# Patient Record
Sex: Male | Born: 1985 | Race: White | Hispanic: No | Smoking: Current every day smoker
Health system: Southern US, Community
[De-identification: ages and names within clinical notes are randomized; demographics above are authoritative.]

## PROBLEM LIST (undated history)

## (undated) DIAGNOSIS — F419 Anxiety disorder, unspecified: Secondary | ICD-10-CM

## (undated) DIAGNOSIS — N289 Disorder of kidney and ureter, unspecified: Secondary | ICD-10-CM

## (undated) DIAGNOSIS — Z87442 Personal history of urinary calculi: Secondary | ICD-10-CM

## (undated) DIAGNOSIS — G723 Periodic paralysis: Secondary | ICD-10-CM

## (undated) DIAGNOSIS — E876 Hypokalemia: Secondary | ICD-10-CM

## (undated) DIAGNOSIS — N200 Calculus of kidney: Secondary | ICD-10-CM

## (undated) DIAGNOSIS — F32A Depression, unspecified: Secondary | ICD-10-CM

## (undated) HISTORY — DX: Depression, unspecified: F32.A

## (undated) HISTORY — PX: OTHER SURGICAL HISTORY: SHX169

## (undated) HISTORY — DX: Anxiety disorder, unspecified: F41.9

## (undated) HISTORY — PX: LITHOTRIPSY: SUR834

---

## 2017-12-05 ENCOUNTER — Encounter (HOSPITAL_BASED_OUTPATIENT_CLINIC_OR_DEPARTMENT_OTHER): Payer: Self-pay | Admitting: *Deleted

## 2017-12-05 ENCOUNTER — Other Ambulatory Visit: Payer: Self-pay

## 2017-12-05 ENCOUNTER — Observation Stay (HOSPITAL_BASED_OUTPATIENT_CLINIC_OR_DEPARTMENT_OTHER)
Admission: EM | Admit: 2017-12-05 | Discharge: 2017-12-06 | DRG: 684 | Disposition: A | Discharge status: Home / Self Care | Payer: Self-pay | Attending: Internal Medicine | Admitting: Internal Medicine

## 2017-12-05 DIAGNOSIS — N179 Acute kidney failure, unspecified: Secondary | ICD-10-CM | POA: Insufficient documentation

## 2017-12-05 DIAGNOSIS — Z881 Allergy status to other antibiotic agents status: Secondary | ICD-10-CM

## 2017-12-05 DIAGNOSIS — N289 Disorder of kidney and ureter, unspecified: Secondary | ICD-10-CM

## 2017-12-05 DIAGNOSIS — M6281 Muscle weakness (generalized): Secondary | ICD-10-CM

## 2017-12-05 DIAGNOSIS — D72829 Elevated white blood cell count, unspecified: Secondary | ICD-10-CM | POA: Diagnosis present

## 2017-12-05 DIAGNOSIS — Z6834 Body mass index (BMI) 34.0-34.9, adult: Secondary | ICD-10-CM

## 2017-12-05 DIAGNOSIS — E860 Dehydration: Secondary | ICD-10-CM | POA: Diagnosis present

## 2017-12-05 DIAGNOSIS — E876 Hypokalemia: Secondary | ICD-10-CM | POA: Diagnosis present

## 2017-12-05 HISTORY — DX: Acute kidney failure, unspecified: N17.9

## 2017-12-05 HISTORY — DX: Hypokalemia: E87.6

## 2017-12-05 HISTORY — DX: Calculus of kidney: N20.0

## 2017-12-05 HISTORY — DX: Disorder of kidney and ureter, unspecified: N28.9

## 2017-12-05 LAB — BASIC METABOLIC PANEL
ANION GAP: 8 (ref 5–15)
BUN: 18 mg/dL (ref 6–20)
CALCIUM: 8.4 mg/dL — AB (ref 8.9–10.3)
CHLORIDE: 115 mmol/L — AB (ref 101–111)
CO2: 18 mmol/L — AB (ref 22–32)
Creatinine, Ser: 2.03 mg/dL — ABNORMAL HIGH (ref 0.61–1.24)
GFR calc non Af Amer: 42 mL/min — ABNORMAL LOW (ref >60)
GFR, EST AFRICAN AMERICAN: 49 mL/min — AB (ref >60)
Glucose, Bld: 125 mg/dL — ABNORMAL HIGH (ref 65–99)
Potassium: 2.3 mmol/L — CL (ref 3.5–5.1)
SODIUM: 141 mmol/L (ref 135–145)

## 2017-12-05 LAB — CBC WITH DIFFERENTIAL/PLATELET
BASOS PCT: 0 %
Basophils Absolute: 0 10*3/uL (ref 0.0–0.1)
EOS ABS: 0.3 10*3/uL (ref 0.0–0.7)
Eosinophils Relative: 2 %
HEMATOCRIT: 47.4 % (ref 39.0–52.0)
Hemoglobin: 17.1 g/dL — ABNORMAL HIGH (ref 13.0–17.0)
Lymphocytes Relative: 24 %
Lymphs Abs: 2.9 10*3/uL (ref 0.7–4.0)
MCH: 30.8 pg (ref 26.0–34.0)
MCHC: 36.1 g/dL — AB (ref 30.0–36.0)
MCV: 85.3 fL (ref 78.0–100.0)
MONOS PCT: 6 %
Monocytes Absolute: 0.7 10*3/uL (ref 0.1–1.0)
NEUTROS ABS: 8.1 10*3/uL — AB (ref 1.7–7.7)
NEUTROS PCT: 68 %
Platelets: 288 10*3/uL (ref 150–400)
RBC: 5.56 MIL/uL (ref 4.22–5.81)
RDW: 13.8 % (ref 11.5–15.5)
WBC: 11.9 10*3/uL — AB (ref 4.0–10.5)

## 2017-12-05 LAB — MAGNESIUM: MAGNESIUM: 2.2 mg/dL (ref 1.7–2.4)

## 2017-12-05 LAB — TSH: TSH: 1.99 u[IU]/mL (ref 0.350–4.500)

## 2017-12-05 MED ORDER — POTASSIUM CHLORIDE CRYS ER 20 MEQ PO TBCR
40.0000 meq | EXTENDED_RELEASE_TABLET | Freq: Once | ORAL | Status: AC
  Administered 2017-12-05: 40 meq via ORAL
  Filled 2017-12-05: qty 2

## 2017-12-05 MED ORDER — POTASSIUM CHLORIDE IN NACL 40-0.9 MEQ/L-% IV SOLN
INTRAVENOUS | Status: DC
  Administered 2017-12-05 – 2017-12-06 (×2): 100 mL/h via INTRAVENOUS
  Filled 2017-12-05 (×2): qty 1000

## 2017-12-05 MED ORDER — SODIUM CHLORIDE 0.9 % IV BOLUS
1000.0000 mL | Freq: Once | INTRAVENOUS | Status: AC
  Administered 2017-12-05: 1000 mL via INTRAVENOUS

## 2017-12-05 MED ORDER — ACETAMINOPHEN 325 MG PO TABS: 650.0000 mg | ORAL_TABLET | Freq: Four times a day (QID) | ORAL | Status: DC | PRN

## 2017-12-05 MED ORDER — ACETAMINOPHEN 650 MG RE SUPP: 650.0000 mg | Freq: Four times a day (QID) | RECTAL | Status: DC | PRN

## 2017-12-05 MED ORDER — POTASSIUM CHLORIDE 10 MEQ/100ML IV SOLN
10.0000 meq | INTRAVENOUS | Status: AC
  Administered 2017-12-05 (×4): 10 meq via INTRAVENOUS
  Filled 2017-12-05: qty 100

## 2017-12-05 MED ORDER — ONDANSETRON HCL 4 MG/2ML IJ SOLN: 4.0000 mg | Freq: Four times a day (QID) | INTRAMUSCULAR | Status: DC | PRN

## 2017-12-05 MED ORDER — ONDANSETRON HCL 4 MG PO TABS: 4.0000 mg | ORAL_TABLET | Freq: Four times a day (QID) | ORAL | Status: DC | PRN

## 2017-12-05 NOTE — ED Notes (Signed)
Attempted to call for report.  Accepting RN not available.  Will try to call again.  [facility] is here to pick up patient.

## 2017-12-05 NOTE — ED Notes (Signed)
Date and time results received: 12/05/17 4:02 PM   Test:Potassium Critical Value: 2.3  Name of Provider Notified: Charm BargesButler  Orders Received? Or Actions Taken?: No orders given

## 2017-12-05 NOTE — Progress Notes (Signed)
ED requesting admission for profound hypokalemia.   Has ordered k dur 40 meq and 4 runs of IV replacement per my discussion with ED provider.  Penny Piarlando Jaythen Hamme, MD

## 2017-12-05 NOTE — ED Notes (Signed)
ED Provider at bedside. 

## 2017-12-05 NOTE — ED Notes (Signed)
Attempted to call report.  [facility] call me back [facility]

## 2017-12-05 NOTE — H&P (Signed)
History and Physical    Adam Weaver ZOX:[id number] DOB: [birth year] DOA: 12/05/2017  Referring MD/NP/PA: Dr. Meridee Score  PCP: Patient, No Pcp Per   Outpatient Specialists: None  Patient coming from: [facility]  Chief Complaint: Nausea vomiting with hypokalemia  HPI: Adam Weaver is a 32 y.o. male with medical history significant of no significant past medical history who apparently ate some waffles yesterday and started having some nausea vomiting but no diarrhea.  He felt so weak today and could not even keep food down.  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  He came to the ER where his potassium was down to 2.3.  Patient is therefore worried and anxious and is being admitted for work-up.  Patient had no prior sick contact.  His nausea with vomiting has slowed down after treatment in the ER.  He also has evidence of acute kidney injury and dehydration.  He is morbidly obese.  ED Course: Patient was evaluated in the ED.  Vitals were stable.  His sodium is 141 but potassium 2.3.  CO2 of 18 with glucose 125.  His BUN is 18 creatinine 2.03.  Normal magnesium with a GFR of 42.  He had a white count of 11.9 hemoglobin 17.1 and platelet 288.  TSH is 1.99.  Patient was therefore transferred from [facility] to continue treatment overnight.  Review of Systems: As per HPI otherwise 10 point review of systems negative.   Past Medical History:  Diagnosis Date  . Hypokalemia   . Kidney stones   . Renal disorder     Past Surgical History:  Procedure Laterality Date  . LITHOTRIPSY       reports that he has never smoked. He has never used smokeless tobacco. He reports that he does not use drugs. His alcohol history is not on file.  Allergies  Allergen Reactions  . Ciprofloxacin   . Zithromax [Azithromycin]     History reviewed. No pertinent family history.   Prior to Admission medications   Not on File    Physical  Exam: Vitals:   12/05/17 1730 12/05/17 1800 12/05/17 1830 12/05/17 1941  BP: 128/87 127/77 123/76 130/73  Pulse: 81 71 83 66  Resp: (!) 22 17 19 20   Temp:    98 F (36.7 C)  TempSrc:    Oral  SpO2: 98% 99% 99% 97%  Weight:      Height:          Constitutional: NAD, calm, comfortable Vitals:   12/05/17 1730 12/05/17 1800 12/05/17 1830 12/05/17 1941  BP: 128/87 127/77 123/76 130/73  Pulse: 81 71 83 66  Resp: (!) 22 17 19 20   Temp:    98 F (36.7 C)  TempSrc:    Oral  SpO2: 98% 99% 99% 97%  Weight:      Height:       Eyes: PERRL, lids and conjunctivae normal ENMT: Mucous membranes are moist. Posterior pharynx clear of any exudate or lesions.Normal dentition.  Neck: normal, supple, no masses, no thyromegaly Respiratory: clear to auscultation bilaterally, no wheezing, no crackles. Normal respiratory effort. No accessory muscle use.  Cardiovascular: Regular rate and rhythm, no murmurs / rubs / gallops. No extremity edema. 2+ pedal pulses. No carotid bruits.  Abdomen: no tenderness, no masses palpated. No hepatosplenomegaly. Bowel sounds positive.  Musculoskeletal: no clubbing / cyanosis. No joint deformity upper and lower extremities. Good ROM, no contractures. Normal muscle tone.  Skin:  no rashes, lesions, ulcers. No induration Neurologic: CN 2-12 grossly intact. Sensation intact, DTR normal. Strength 5/5 in all 4.  Psychiatric: Normal judgment and insight. Alert and oriented x 3. Normal mood.   Labs on Admission: I have personally reviewed following labs and imaging studies  CBC: Recent Labs  Lab 12/05/17 1520  WBC 11.9*  NEUTROABS 8.1*  HGB 17.1*  HCT 47.4  MCV 85.3  PLT 288   Basic Metabolic Panel: Recent Labs  Lab 12/05/17 1520  NA 141  K 2.3*  CL 115*  CO2 18*  GLUCOSE 125*  BUN 18  CREATININE 2.03*  CALCIUM 8.4*  MG 2.2   GFR: Estimated Creatinine Clearance: 67.5 mL/min (A) (by C-G formula based on SCr of 2.03 mg/dL (H)). Liver Function Tests: No  results for input(s): AST, ALT, ALKPHOS, BILITOT, PROT, ALBUMIN in the last 168 hours. No results for input(s): LIPASE, AMYLASE in the last 168 hours. No results for input(s): AMMONIA in the last 168 hours. Coagulation Profile: No results for input(s): INR, PROTIME in the last 168 hours. Cardiac Enzymes: No results for input(s): CKTOTAL, CKMB, CKMBINDEX, TROPONINI in the last 168 hours. BNP (last 3 results) No results for input(s): PROBNP in the last 8760 hours. HbA1C: No results for input(s): HGBA1C in the last 72 hours. CBG: No results for input(s): GLUCAP in the last 168 hours. Lipid Profile: No results for input(s): CHOL, HDL, LDLCALC, TRIG, CHOLHDL, LDLDIRECT in the last 72 hours. Thyroid Function Tests: No results for input(s): TSH, T4TOTAL, FREET4, T3FREE, THYROIDAB in the last 72 hours. Anemia Panel: No results for input(s): VITAMINB12, FOLATE, FERRITIN, TIBC, IRON, RETICCTPCT in the last 72 hours. Urine analysis: No results found for: COLORURINE, APPEARANCEUR, LABSPEC, PHURINE, GLUCOSEU, HGBUR, BILIRUBINUR, KETONESUR, PROTEINUR, UROBILINOGEN, NITRITE, LEUKOCYTESUR Sepsis Labs: @LABRCNTIP (procalcitonin:4,lacticidven:4) )No results found for this or any previous visit (from the past 240 hour(s)).   Radiological Exams on Admission: No results found.  EKG: Independently reviewed.  Normal sinus rhythm with no specific changes and abnormality  Assessment/Plan Principal Problem:   Hypokalemia Active Problems:   ARF (acute renal failure) (HCC)   Leucocytosis   Dehydration     #1 hypokalemia: Profound hypokalemia probably secondary to his nausea with vomiting.  We will replete potassium aggressively.  Admit the patient to telemetry.  IV fluid resuscitation.    #2 acute kidney injury: Probably prerenal from the vomiting.  Patient said he has had after 7 or 8 episodes already.  Aggressively hydrate and monitor renal function  #3 dehydration: Continue normal saline to hydrate  patient  #4 leukocytosis: White count appears to be elevated but stable.  Continue monitor  DVT prophylaxis: SCD  Code Status: Full  Family Communication: None at this point  Disposition Plan: Home  Consults called: None Admission status: Observation  Severity of Illness: The appropriate patient status for this patient is OBSERVATION. Observation status is judged to be reasonable and necessary in order to provide the required intensity of service to ensure the patient's safety. The patient's presenting symptoms, physical exam findings, and initial radiographic and laboratory data in the context of their medical condition is felt to place them at decreased risk for further clinical deterioration. Furthermore, it is anticipated that the patient will be medically stable for discharge from the hospital within 2 midnights of admission. The following factors support the patient status of observation.   " The patient's presenting symptoms include nausea vomiting and weakness. " The physical exam findings include dehydration. " The initial radiographic and laboratory  data are potassium of 2.3 and creatinine of 2.03.     Lonia BloodGARBA,LAWAL MD [facility] Pager [phone]  If 7PM-7AM, please contact night-coverage [email or web address] Password [email or web address]  12/05/2017, 8:23 PM

## 2017-12-05 NOTE — ED Triage Notes (Signed)
Pt c/o weakness x 7 hrs , HX hypokalemia

## 2017-12-05 NOTE — ED Provider Notes (Addendum)
[facility] EMERGENCY DEPARTMENT Provider Note   CSN: [id number] Arrival date & time: 12/05/17  1455     History   Chief Complaint Chief Complaint  Patient presents with  . hypokalemia    HPI Adam Weaver is a 32 y.o. male.  He presents to the emergency department today complaining of generalized weakness since waking up around 7 AM this morning.  He has a prior history of hypokalemia can cause a flaccid paralysis and has been to the ED before for repletion.  He usually follows at [facility] in [place].  He denies any recent illnesses and stated he had a waffle yesterday but no other heavy carb meals or anything else that he can think of to trigger this.  He states he was able to walk in to the emergency department today but he felt like he was lifting boulders for legs.  There is no current pain no fever no chest pain no shortness of breath no abdominal pain vomiting or diarrhea.  No fevers or chills.  No cough.  No sick contacts.  He is on oral potassium repletion only.  The history is provided by the patient.  Weakness  Primary symptoms include no focal weakness. This is a recurrent problem. The current episode started 6 to 12 hours ago. The problem has not changed since onset.There was no focality noted. There has been no fever. Pertinent negatives include no shortness of breath, no chest pain, no vomiting, no altered mental status, no confusion and no headaches. There were no medications administered prior to arrival. Associated medical issues do not include trauma, seizures or CVA.    Past Medical History:  Diagnosis Date  . Hypokalemia   . Kidney stones   . Renal disorder     There are no active problems to display for this patient.   Past Surgical History:  Procedure Laterality Date  . LITHOTRIPSY          Home Medications    Prior to Admission medications   Not on File    Family History History reviewed. No pertinent family history.  Social  History Social History   Tobacco Use  . Smoking status: Never Smoker  . Smokeless tobacco: Never Used  Substance Use Topics  . Alcohol use: Not on file  . Drug use: Never     Allergies   Ciprofloxacin and Zithromax [azithromycin]   Review of Systems Review of Systems  Constitutional: Negative for chills and fever.  HENT: Negative for ear pain and sore throat.   Eyes: Negative for pain and visual disturbance.  Respiratory: Negative for cough and shortness of breath.   Cardiovascular: Negative for chest pain and palpitations.  Gastrointestinal: Negative for abdominal pain and vomiting.  Genitourinary: Negative for dysuria and hematuria.  Musculoskeletal: Negative for arthralgias and back pain.  Skin: Negative for color change and rash.  Neurological: Positive for weakness. Negative for focal weakness, seizures, syncope and headaches.  Psychiatric/Behavioral: Negative for confusion.  All other systems reviewed and are negative.    Physical Exam Updated Vital Signs BP (!) 130/100 (BP Location: Left Arm)   Pulse 70   Temp 98.6 F (37 C) (Oral)   Resp 16   Ht 5\' 11"  (1.803 m)   Wt 113.4 kg (250 lb)   SpO2 100%   BMI 34.87 kg/m   Physical Exam  Constitutional: He appears well-developed and well-nourished.  HENT:  Head: Normocephalic and atraumatic.  Right Ear: External ear normal.  Left Ear: External  ear normal.  Mouth/Throat: Oropharynx is clear and moist.  Eyes: Pupils are equal, round, and reactive to light. Conjunctivae and EOM are normal.  Neck: Normal range of motion. Neck supple.  Cardiovascular: Normal rate, regular rhythm, normal heart sounds and intact distal pulses.  No murmur heard. Pulmonary/Chest: Effort normal and breath sounds normal. No respiratory distress.  Abdominal: Soft. There is no tenderness.  Musculoskeletal: Normal range of motion. He exhibits no edema, tenderness or deformity.  Neurological: He is alert. He has normal strength. No  cranial nerve deficit or sensory deficit. GCS eye subscore is 4. GCS verbal subscore is 5. GCS motor subscore is 6.  Skin: Skin is warm and dry.  Psychiatric: He has a normal mood and affect.  Nursing note and vitals reviewed.    ED Treatments / Results  Labs (all labs ordered are listed, but only abnormal results are displayed) Labs Reviewed  BASIC METABOLIC PANEL - Abnormal; Notable for the following components:      Result Value   Potassium 2.3 (*)    Chloride 115 (*)    CO2 18 (*)    Glucose, Bld 125 (*)    Creatinine, Ser 2.03 (*)    Calcium 8.4 (*)    GFR calc non Af Amer 42 (*)    GFR calc Af Amer 49 (*)    All other components within normal limits  CBC WITH DIFFERENTIAL/PLATELET - Abnormal; Notable for the following components:   WBC 11.9 (*)    Hemoglobin 17.1 (*)    MCHC 36.1 (*)    Neutro Abs 8.1 (*)    All other components within normal limits  COMPREHENSIVE METABOLIC PANEL - Abnormal; Notable for the following components:   Chloride 120 (*)    CO2 20 (*)    Creatinine, Ser 1.83 (*)    Calcium 8.3 (*)    Total Protein 6.3 (*)    AST 13 (*)    GFR calc non Af Amer 48 (*)    GFR calc Af Amer 55 (*)    Anion gap 4 (*)    All other components within normal limits  MAGNESIUM  CBC  TSH  HIV ANTIBODY (ROUTINE TESTING)    EKG None - see below  Radiology No results found.  Procedures .Critical Care Performed by: Terrilee Files, MD Authorized by: Terrilee Files, MD   Critical care provider statement:    Critical care time (minutes):  30   Critical care was necessary to treat or prevent imminent or life-threatening deterioration of the following conditions:  Metabolic crisis   Critical care was time spent personally by me on the following activities:  Development of treatment plan with patient or surrogate, evaluation of patient's response to treatment, examination of patient, obtaining history from patient or surrogate, ordering and performing  treatments and interventions, ordering and review of laboratory studies, pulse oximetry, re-evaluation of patient's condition and review of old charts   I assumed direction of critical care for this patient from another provider in my specialty: no     (including critical care time)  Medications Ordered in ED Medications - No data to display   Initial Impression / Assessment and Plan / ED Course  I have reviewed the triage vital signs and the nursing notes.  Pertinent labs & imaging results that were available during my care of the patient were reviewed by me and considered in my medical decision making (see chart for details).  Clinical Course as of Dec 07 1210  Fri Dec 05, 2017  1542 Differential diagnosis includes hypokalemia, metabolic derangement, renal failure, anemia, myocardial ischemia, infection.  Getting some screening labs and EKG.   [MB]  1616 Reviewed the patient's lab results with him.  I inquired about his creatinine being elevated and he said he is been told he has some level of kidney disease but he could not recall the last number.  He states that usually takes a couple of days of him on IV potassium before he improves enough to be discharged so I will talk to the hospitalist regarding admission.   [MB]  1634 Discussed with Dr. Cena B[facility]Vega [facility] for [facility].   [MB]  1639 EKG is normal sinus rhythm rate of 80 with multifocal PVCs.  Normal intervals.  No acute ST-T changes.  No prior to compare with.   [MB]    Clinical Course User Index [MB] Terrilee FilesButler, Chayil Gantt C, MD     Final Clinical Impressions(s) / ED Diagnoses   Final diagnoses:  Acute hypokalemia  Muscle weakness (generalized)  Renal insufficiency    ED Discharge Orders    None       Terrilee FilesButler, Elizbeth Posa C, MD 12/06/17 1212    Terrilee FilesButler, Mackenna Kamer C, MD 12/15/17 1153

## 2017-12-06 DIAGNOSIS — E876 Hypokalemia: Secondary | ICD-10-CM

## 2017-12-06 LAB — COMPREHENSIVE METABOLIC PANEL
ALK PHOS: 86 U/L (ref 38–126)
ALT: 18 U/L (ref 17–63)
ANION GAP: 4 — AB (ref 5–15)
AST: 13 U/L — ABNORMAL LOW (ref 15–41)
Albumin: 3.6 g/dL (ref 3.5–5.0)
BUN: 16 mg/dL (ref 6–20)
CALCIUM: 8.3 mg/dL — AB (ref 8.9–10.3)
CHLORIDE: 120 mmol/L — AB (ref 101–111)
CO2: 20 mmol/L — AB (ref 22–32)
Creatinine, Ser: 1.83 mg/dL — ABNORMAL HIGH (ref 0.61–1.24)
GFR, EST AFRICAN AMERICAN: 55 mL/min — AB (ref >60)
GFR, EST NON AFRICAN AMERICAN: 48 mL/min — AB (ref >60)
Glucose, Bld: 98 mg/dL (ref 65–99)
Potassium: 4 mmol/L (ref 3.5–5.1)
SODIUM: 144 mmol/L (ref 135–145)
Total Bilirubin: 0.6 mg/dL (ref 0.3–1.2)
Total Protein: 6.3 g/dL — ABNORMAL LOW (ref 6.5–8.1)

## 2017-12-06 LAB — CBC
HCT: 43.9 % (ref 39.0–52.0)
Hemoglobin: 15.3 g/dL (ref 13.0–17.0)
MCH: 31 pg (ref 26.0–34.0)
MCHC: 34.9 g/dL (ref 30.0–36.0)
MCV: 88.9 fL (ref 78.0–100.0)
PLATELETS: 270 10*3/uL (ref 150–400)
RBC: 4.94 MIL/uL (ref 4.22–5.81)
RDW: 14 % (ref 11.5–15.5)
WBC: 9.9 10*3/uL (ref 4.0–10.5)

## 2017-12-06 LAB — HIV ANTIBODY (ROUTINE TESTING W REFLEX): HIV Screen 4th Generation wRfx: NONREACTIVE

## 2017-12-06 MED ORDER — POTASSIUM CHLORIDE ER 20 MEQ PO TBCR: 20.0000 meq | EXTENDED_RELEASE_TABLET | Freq: Two times a day (BID) | ORAL | 0 refills | Status: DC

## 2017-12-06 MED ORDER — SODIUM CHLORIDE 0.9 % IV BOLUS
1000.0000 mL | Freq: Once | INTRAVENOUS | Status: AC
  Administered 2017-12-06: 1000 mL via INTRAVENOUS

## 2017-12-06 NOTE — Progress Notes (Signed)
Discussed with patient and girlfriend discharge instructions, both verbalized agreement and understanding.  Patient will be going home with all belongings in private vehicle.

## 2017-12-06 NOTE — Discharge Instructions (Signed)
Hypokalemia Hypokalemia means that the amount of potassium in the blood is lower than normal.Potassium is a chemical that helps regulate the amount of fluid in the body (electrolyte). It also stimulates muscle tightening (contraction) and helps nerves work properly.Normally, most of the bodys potassium is inside of cells, and only a very small amount is in the blood. Because the amount in the blood is so small, minor changes to potassium levels in the blood can be life-threatening. What are the causes? This condition may be caused by:  Antibiotic medicine.  Diarrhea or vomiting. Taking too much of a medicine that helps you have a bowel movement (laxative) can cause diarrhea and lead to hypokalemia.  Chronic kidney disease (CKD).  Medicines that help the body get rid of excess fluid (diuretics).  Eating disorders, such as bulimia.  Low magnesium levels in the body.  Sweating a lot.  What are the signs or symptoms? Symptoms of this condition include:  Weakness.  Constipation.  Fatigue.  Muscle cramps.  Mental confusion.  Skipped heartbeats or irregular heartbeat (palpitations).  Tingling or numbness.  How is this diagnosed? This condition is diagnosed with a blood test. How is this treated? Hypokalemia can be treated by taking potassium supplements by mouth or adjusting the medicines that you take. Treatment may also include eating more foods that contain a lot of potassium. If your potassium level is very low, you may need to get potassium through an IV tube in one of your veins and be monitored in the hospital. Follow these instructions at home:  Take over-the-counter and prescription medicines only as told by your health care provider. This includes vitamins and supplements.  Eat a healthy diet. A healthy diet includes fresh fruits and vegetables, whole grains, healthy fats, and lean proteins.  If instructed, eat more foods that contain a lot of potassium, such  as: ? Nuts, such as peanuts and pistachios. ? Seeds, such as sunflower seeds and pumpkin seeds. ? Peas, lentils, and lima beans. ? Whole grain and bran cereals and breads. ? Fresh fruits and vegetables, such as apricots, avocado, bananas, cantaloupe, kiwi, oranges, tomatoes, asparagus, and potatoes. ? Orange juice. ? Tomato juice. ? Red meats. ? Yogurt.  Keep all follow-up visits as told by your health care provider. This is important. Contact a health care provider if:  You have weakness that gets worse.  You feel your heart pounding or racing.  You vomit.  You have diarrhea.  You have diabetes (diabetes mellitus) and you have trouble keeping your blood sugar (glucose) in your target range. Get help right away if:  You have chest pain.  You have shortness of breath.  You have vomiting or diarrhea that lasts for more than 2 days.  You faint. This information is not intended to replace advice given to you by your health care provider. Make sure you discuss any questions you have with your health care provider. Document Released: 06/17/2005 Document Revised: 02/03/2016 Document Reviewed: 02/03/2016 Elsevier Interactive Patient Education  2018 [employer]  Hyperkalemia Hyperkalemia is when you have too much potassium in your blood. Potassium is normally removed (excreted) from your body by your kidneys. If there is too much potassium in your blood, it can affect how your heart works. Follow these instructions at home:  Take medicines only as told by your doctor.  Do not take any supplements, natural products, herbs, or vitamins unless your doctor says it is okay.  Limit your alcohol intake as told by your doctor.  Stop illegal drug use. If you need help quitting, ask your doctor.  Keep all follow-up visits as told by your doctor. This is important.  If you have kidney disease, you may need to follow a low potassium diet. A food specialist (dietitian) can help  you. Contact a doctor if:  Your heartbeat is not regular or very slow.  You feel dizzy (light-headed).  You feel weak.  You feel sick to your stomach (nauseous).  You have tingling in your hands or feet.  You cannot feel your hands or feet. Get help right away if:  You are short of breath.  You have chest pain.  You pass out (faint).  You cannot move your muscles. This information is not intended to replace advice given to you by your health care provider. Make sure you discuss any questions you have with your health care provider. Document Released: 06/17/2005 Document Revised: 11/23/2015 Document Reviewed: 09/22/2013 Elsevier Interactive Patient Education  2018 [employer].  Water Intoxication Water intoxication is a condition that can result when you drink water faster than your body can remove it. This can happen when you drink a lot of water very quickly over a short period of time. Water intoxication can cause fluid buildup that affects the brain and lungs. Excess water can also cause the amount of salt in your blood to become too low (hyponatremia). This condition can range from mild to severe. What are the causes? This condition is caused by drinking water faster than your body can remove it. What increases the risk? This condition is more likely to develop in people who:  Drink water often during an endurance event that lasts more than four hours.  Drink more than 17-34 oz (500-1,000 mL) of water or sports drinks per hour while exercising.  Have a health condition that makes it hard for their body to get rid of excess water, such as sickle cell anemia.  Have other health conditions that increase the risk for water intoxication, such as cystic fibrosis.  Have been losing fluids, sodium, and nutrients through heavy sweating, vomiting, or diarrhea.  What are the signs or symptoms? Symptoms of this condition include:  Bloating.  Gaining weight several hours  after an endurance activity.  Swelling.  Nausea or vomiting.  Headache.  Irregular heartbeat.  Loss of appetite.  Cramps in the abdomen.  Changed mental state, such as confusion or irritability.  Convulsions.  Unconsciousness or coma.  Trouble breathing.  Fluttering eyelids.  Symptoms can appear up to 24 hours after drinking too much water. How is this diagnosed? This condition is diagnosed based on your medical history, symptoms, and a physical exam. You may also have tests done, such as:  Blood tests.  Urine tests.  How is this treated? Treatment for this condition includes:  Limiting how much water and other fluids you drink.  Taking oral sodium tablets.  Drinking beverages that have a high sodium content.  Receiving medicines through an IV tube to control your symptoms.  Follow these instructions at home:  Limit how much water or other fluids you drink as told by your health care provider.  Keep all follow-up visits as told by your health care provider.  Take over-the-counter and prescription medicines only as told by your health care provider. This includes any sodium tablets. How is this prevented?  Drink only when you feel thirsty. Contact a health care provider if:  You continue to have symptoms of water intoxication after treatment. Get help  right away if:  You faint.  Your mood or your thought patterns change.  You have a seizure. This information is not intended to replace advice given to you by your health care provider. Make sure you discuss any questions you have with your health care provider. Document Released: 07/24/2005 Document Revised: 03/29/2016 Document Reviewed: 03/29/2016 Elsevier Interactive Patient Education  [employer].

## 2017-12-07 NOTE — Discharge Summary (Signed)
[facility] Discharge Summary   Patient: Adam Weaver WUJ:[id number]   PCP: Patient, No Pcp Per DOB: 04/02/1986   Date of admission: 12/05/2017   Date of discharge: 12/06/2017    Discharge Diagnoses:  Principal Problem:   Hypokalemia Active Problems:   ARF (acute renal failure) (HCC)   Leucocytosis   Dehydration   Admitted From: home Disposition:  home  Recommendations for Outpatient Follow-up:  1. Please follow up with PCP in 1 week   Follow-up Information    PCP. Schedule an appointment as soon as possible for a visit in 1 week(s).   Why:  need to check potassium levels in 1 week to avoid excessive potassium in blood.          Diet recommendation: full code  Activity: The patient is advised to gradually reintroduce usual activities.  Discharge Condition: good  Code Status: full code  History of present illness: As per the H and P dictated on admission, "Adam Weaver is a 32 y.o. male with medical history significant of no significant past medical history who apparently ate some waffles yesterday and started having some nausea vomiting but no diarrhea.  He felt so weak today and could not even keep food down.  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  He came to the ER where his potassium was down to 2.3.  Patient is therefore worried and anxious and is being admitted for work-up.  Patient had no prior sick contact.  His nausea with vomiting has slowed down after treatment in the ER.  He also has evidence of acute kidney injury and dehydration.  He is morbidly obese."  Hospital Course:  Summary of his active problems in the hospital is as following. #1 hypokalemia: Profound hypokalemia probably secondary to his nausea with vomiting.  Patient has chronic hypokalemia and actually uses potassium supplements over-the-counter on a daily basis. We will prescribe equivalent potassium supplements on discharge. Recommend PCP follow-up  in 1 week with a BMP.  #2 acute kidney injury: Probably prerenal from the vomiting.  Patient said he has had after 7 or 8 episodes already.  Aggressively hydrate and monitor renal function   #3 dehydration:  She was given IV hydration with resolution. Interestingly patient mentions to me that he drinks 6 L of water on a daily basis. Recommend to monitor water intake.  #4 leukocytosis: White count appears to be elevated but stable.  Continue monitor   All other chronic medical condition were stable during the hospitalization.  Patient was ambulatory without any assistance. On the day of the discharge the patient's vitals were stable , and no other acute medical condition were reported by patient. the patient was felt safe to be discharge at [facility] with family.  Consultants: none Procedures: none  DISCHARGE MEDICATION: Allergies as of 12/06/2017      Reactions   Ciprofloxacin    Zithromax [azithromycin]       Medication List    STOP taking these medications   Potassium 99 MG Tabs     TAKE these medications   Potassium Chloride ER 20 MEQ Tbcr Take 20 mEq by mouth 2 (two) times daily.      Allergies  Allergen Reactions  . Ciprofloxacin   . Zithromax [Azithromycin]    Discharge Instructions    Diet general   Complete by:  As directed    Discharge instructions   Complete by:  As directed    It is important that you  read following instructions as well as go over your medication list with RN to help you understand your care after this hospitalization.  Discharge Instructions: Please follow-up with PCP in one week  Please request your primary care physician to go over all Hospital Tests and Procedure/Radiological results at the follow up,  Please get all Hospital records sent to your PCP by signing hospital release before you go home.   Do not take more than prescribed Pain, Sleep and Anxiety Medications. You were cared for by a hospitalist during your hospital stay. If  you have any questions about your discharge medications or the care you received while you were in the hospital after you are discharged, you can call the unit and ask to speak with the hospitalist on call if the hospitalist that took care of you is not available.  Once you are discharged, your primary care physician will handle any further medical issues. Please note that NO REFILLS for any discharge medications will be authorized once you are discharged, as it is imperative that you return to your primary care physician (or establish a relationship with a primary care physician if you do not have one) for your aftercare needs so that they can reassess your need for medications and monitor your lab values. You Must read complete instructions/literature along with all the possible adverse reactions/side effects for all the Medicines you take and that have been prescribed to you. Take any new Medicines after you have completely understood and accept all the possible adverse reactions/side effects. Wear Seat belts while driving. If you have smoked or chewed Tobacco in the last 2 yrs please stop smoking and/or stop any Recreational drug use.   Increase activity slowly   Complete by:  As directed      Discharge Exam: Filed Weights   12/05/17 1500  Weight: 113.4 kg (250 lb)   Vitals:   12/05/17 1941 12/06/17 0712  BP: 130/73 124/82  Pulse: 66 69  Resp: 20 16  Temp: 98 F (36.7 C) 97.8 F (36.6 C)  SpO2: 97% 98%   General: Appear in no distress, no Rash; Oral Mucosa moist. Cardiovascular: S1 and S2 Present, no Murmur, no JVD Respiratory: Bilateral Air entry present and Clear to Auscultation, no Crackles, no wheezes Abdomen: Bowel Sound present, Soft and no tenderness Extremities: no Pedal edema, no calf tenderness Neurology: Grossly no focal neuro deficit.  The results of significant diagnostics from this hospitalization (including imaging, microbiology, ancillary and laboratory) are listed  below for reference.    Significant Diagnostic Studies: No results found.  Microbiology: No results found for this or any previous visit (from the past 240 hour(s)).   Labs: CBC: Recent Labs  Lab 12/05/17 1520 12/06/17 0414  WBC 11.9* 9.9  NEUTROABS 8.1*  --   HGB 17.1* 15.3  HCT 47.4 43.9  MCV 85.3 88.9  PLT 288 270   Basic Metabolic Panel: Recent Labs  Lab 12/05/17 1520 12/06/17 0414  NA 141 144  K 2.3* 4.0  CL 115* 120*  CO2 18* 20*  GLUCOSE 125* 98  BUN 18 16  CREATININE 2.03* 1.83*  CALCIUM 8.4* 8.3*  MG 2.2  --    Liver Function Tests: Recent Labs  Lab 12/06/17 0414  AST 13*  ALT 18  ALKPHOS 86  BILITOT 0.6  PROT 6.3*  ALBUMIN 3.6   Time spent: 35 minutes  Signed:  Lynden Oxford  [facility] 12/06/2017 , 3:31 PM

## 2017-12-24 ENCOUNTER — Emergency Department (HOSPITAL_BASED_OUTPATIENT_CLINIC_OR_DEPARTMENT_OTHER)
Admission: EM | Admit: 2017-12-24 | Discharge: 2017-12-25 | Disposition: A | Discharge status: Home / Self Care | Payer: Self-pay | Attending: Emergency Medicine | Admitting: Emergency Medicine

## 2017-12-24 ENCOUNTER — Other Ambulatory Visit: Payer: Self-pay

## 2017-12-24 ENCOUNTER — Encounter (HOSPITAL_BASED_OUTPATIENT_CLINIC_OR_DEPARTMENT_OTHER): Payer: Self-pay | Admitting: *Deleted

## 2017-12-24 DIAGNOSIS — N23 Unspecified renal colic: Secondary | ICD-10-CM | POA: Insufficient documentation

## 2017-12-24 DIAGNOSIS — N135 Crossing vessel and stricture of ureter without hydronephrosis: Secondary | ICD-10-CM

## 2017-12-24 DIAGNOSIS — Z79899 Other long term (current) drug therapy: Secondary | ICD-10-CM | POA: Insufficient documentation

## 2017-12-24 LAB — URINALYSIS, MICROSCOPIC (REFLEX)

## 2017-12-24 LAB — URINALYSIS, ROUTINE W REFLEX MICROSCOPIC
Bilirubin Urine: NEGATIVE
Glucose, UA: NEGATIVE mg/dL
Ketones, ur: NEGATIVE mg/dL
NITRITE: NEGATIVE
PH: 6.5 (ref 5.0–8.0)
Protein, ur: NEGATIVE mg/dL
SPECIFIC GRAVITY, URINE: 1.01 (ref 1.005–1.030)

## 2017-12-24 NOTE — ED Triage Notes (Signed)
Right flank pain x 3 hours. Hx of kidney stones and stage 3 kidney disease.

## 2017-12-25 ENCOUNTER — Emergency Department (HOSPITAL_BASED_OUTPATIENT_CLINIC_OR_DEPARTMENT_OTHER): Payer: Self-pay

## 2017-12-25 LAB — CBC
HEMATOCRIT: 44.5 % (ref 39.0–52.0)
HEMOGLOBIN: 15.6 g/dL (ref 13.0–17.0)
MCH: 30.5 pg (ref 26.0–34.0)
MCHC: 35.1 g/dL (ref 30.0–36.0)
MCV: 87.1 fL (ref 78.0–100.0)
Platelets: 292 10*3/uL (ref 150–400)
RBC: 5.11 MIL/uL (ref 4.22–5.81)
RDW: 14 % (ref 11.5–15.5)
WBC: 13 10*3/uL — AB (ref 4.0–10.5)

## 2017-12-25 LAB — COMPREHENSIVE METABOLIC PANEL
ALBUMIN: 3.8 g/dL (ref 3.5–5.0)
ALK PHOS: 83 U/L (ref 38–126)
ALT: 22 U/L (ref 0–44)
AST: 21 U/L (ref 15–41)
Anion gap: 4 — ABNORMAL LOW (ref 5–15)
BILIRUBIN TOTAL: 0.5 mg/dL (ref 0.3–1.2)
BUN: 17 mg/dL (ref 6–20)
CALCIUM: 8.1 mg/dL — AB (ref 8.9–10.3)
CO2: 15 mmol/L — AB (ref 22–32)
CREATININE: 1.8 mg/dL — AB (ref 0.61–1.24)
Chloride: 121 mmol/L — ABNORMAL HIGH (ref 98–111)
GFR calc Af Amer: 56 mL/min — ABNORMAL LOW (ref >60)
GFR calc non Af Amer: 49 mL/min — ABNORMAL LOW (ref >60)
GLUCOSE: 95 mg/dL (ref 70–99)
Potassium: 3.4 mmol/L — ABNORMAL LOW (ref 3.5–5.1)
SODIUM: 140 mmol/L (ref 135–145)
TOTAL PROTEIN: 6.4 g/dL — AB (ref 6.5–8.1)

## 2017-12-25 MED ORDER — HYDROMORPHONE HCL 1 MG/ML IJ SOLN
1.0000 mg | Freq: Once | INTRAMUSCULAR | Status: AC
  Administered 2017-12-25: 1 mg via INTRAVENOUS
  Filled 2017-12-25: qty 1

## 2017-12-25 MED ORDER — ACETAMINOPHEN ER 650 MG PO TBCR: 650.0000 mg | EXTENDED_RELEASE_TABLET | Freq: Three times a day (TID) | ORAL | 0 refills | Status: DC

## 2017-12-25 MED ORDER — TAMSULOSIN HCL 0.4 MG PO CAPS: 0.4000 mg | ORAL_CAPSULE | Freq: Every day | ORAL | 0 refills | Status: DC

## 2017-12-25 MED ORDER — ONDANSETRON HCL 4 MG/2ML IJ SOLN
4.0000 mg | Freq: Once | INTRAMUSCULAR | Status: AC | PRN
  Administered 2017-12-25: 4 mg via INTRAVENOUS
  Filled 2017-12-25: qty 2

## 2017-12-25 MED ORDER — OXYCODONE-ACETAMINOPHEN 5-325 MG PO TABS
1.0000 | ORAL_TABLET | Freq: Once | ORAL | Status: AC
  Administered 2017-12-25: 1 via ORAL
  Filled 2017-12-25: qty 1

## 2017-12-25 MED ORDER — HYDROCODONE-ACETAMINOPHEN 5-325 MG PO TABS: 1.0000 | ORAL_TABLET | Freq: Four times a day (QID) | ORAL | 0 refills | Status: DC | PRN

## 2017-12-25 MED ORDER — ONDANSETRON 4 MG PO TBDP
4.0000 mg | ORAL_TABLET | Freq: Once | ORAL | Status: AC
  Administered 2017-12-25: 4 mg via ORAL
  Filled 2017-12-25: qty 1

## 2017-12-25 MED ORDER — ONDANSETRON 8 MG PO TBDP: 8.0000 mg | ORAL_TABLET | Freq: Three times a day (TID) | ORAL | 0 refills | Status: DC | PRN

## 2017-12-25 NOTE — Discharge Instructions (Signed)
We saw you in the ER for the abdominal pain. °Our results indicate that you have a kidney stone. °We were able to get your pain is relative control, and we can safely send you home. ° °Take the meds prescribed. °Set up an appointment with the Urologist. °If the pain is unbearable, you start having fevers, chills, and are unable to keep any meds down - then return to the ER. °  °

## 2017-12-25 NOTE — ED Provider Notes (Addendum)
[facility] EMERGENCY DEPARTMENT Provider Note   CSN: [id number] Arrival date & time: 12/24/17  2258     History   Chief Complaint Chief Complaint  Patient presents with  . Flank Pain    HPI Andres Bantz is a 32 y.o. male.  HPI  32 year old male comes in with chief complaint of flank pain. Patient has history of CKD, hypokalemia and multiple kidney stones.  He states that he has required lithotripsy in the past, and the last stone disease was 6 years ago.  Patient started having pain this afternoon overhead the right flank region, however around 9 PM his pain got severe.  Pain is located in the right flank region and radiates down to his groin.  Patient has nausea without vomiting.  Past Medical History:  Diagnosis Date  . Hypokalemia   . Kidney stones   . Renal disorder     Patient Active Problem List   Diagnosis Date Noted  . Hypokalemia 12/05/2017  . ARF (acute renal failure) (HCC) 12/05/2017  . Leucocytosis 12/05/2017  . Dehydration 12/05/2017    Past Surgical History:  Procedure Laterality Date  . LITHOTRIPSY          Home Medications    Prior to Admission medications   Medication Sig Start Date End Date Taking? Authorizing Provider  acetaminophen (TYLENOL 8 HOUR) 650 MG CR tablet Take 1 tablet (650 mg total) by mouth every 8 (eight) hours. 12/25/17   Derwood Kaplan, MD  HYDROcodone-acetaminophen (NORCO/VICODIN) 5-325 MG tablet Take 1 tablet by mouth every 6 (six) hours as needed. 12/25/17   Derwood Kaplan, MD  ondansetron (ZOFRAN ODT) 8 MG disintegrating tablet Take 1 tablet (8 mg total) by mouth every 8 (eight) hours as needed for nausea. 12/25/17   Derwood Kaplan, MD  potassium chloride 20 MEQ TBCR Take 20 mEq by mouth 2 (two) times daily. 12/06/17   Rolly Salter, MD  tamsulosin (FLOMAX) 0.4 MG CAPS capsule Take 1 capsule (0.4 mg total) by mouth daily. 12/25/17   Derwood Kaplan, MD    Family History History reviewed. No pertinent family  history.  Social History Social History   Tobacco Use  . Smoking status: Never Smoker  . Smokeless tobacco: Never Used  Substance Use Topics  . Alcohol use: Not on file  . Drug use: Never     Allergies   Ciprofloxacin and Zithromax [azithromycin]   Review of Systems Review of Systems  Constitutional: Positive for activity change. Negative for fatigue.  Respiratory: Negative for shortness of breath.   Cardiovascular: Negative for chest pain.  Gastrointestinal: Positive for nausea.  Genitourinary: Negative for dysuria.     Physical Exam Updated Vital Signs BP 126/86 (BP Location: Right Arm)   Pulse 86   Temp 97.8 F (36.6 C) (Oral)   Resp 18   Ht 5\' 11"  (1.803 m)   Wt 113.4 kg (250 lb)   SpO2 94%   BMI 34.87 kg/m   Physical Exam  Constitutional: He is oriented to person, place, and time. He appears well-developed.  HENT:  Head: Atraumatic.  Neck: Neck supple.  Cardiovascular: Normal rate.  Pulmonary/Chest: Effort normal.  Abdominal: Soft. There is tenderness.  Genitourinary:  Genitourinary Comments: Testicles are free moving  Neurological: He is alert and oriented to person, place, and time.  Skin: Skin is warm.  Nursing note and vitals reviewed.    ED Treatments / Results  Labs (all labs ordered are listed, but only abnormal results are displayed) Labs Reviewed  URINALYSIS, ROUTINE W REFLEX MICROSCOPIC - Abnormal; Notable for the following components:      Result Value   Hgb urine dipstick LARGE (*)    Leukocytes, UA MODERATE (*)    All other components within normal limits  URINALYSIS, MICROSCOPIC (REFLEX) - Abnormal; Notable for the following components:   Bacteria, UA FEW (*)    All other components within normal limits  CBC - Abnormal; Notable for the following components:   WBC 13.0 (*)    All other components within normal limits  COMPREHENSIVE METABOLIC PANEL - Abnormal; Notable for the following components:   Potassium 3.4 (*)     Chloride 121 (*)    CO2 15 (*)    Creatinine, Ser 1.80 (*)    Calcium 8.1 (*)    Total Protein 6.4 (*)    GFR calc non Af Amer 49 (*)    GFR calc Af Amer 56 (*)    Anion gap 4 (*)    All other components within normal limits  BASIC METABOLIC PANEL    EKG None  Radiology Ct Renal Stone Study  Result Date: 12/25/2017 CLINICAL DATA:  32 y/o  M; 3 hours of right flank pain.  Hematuria. EXAM: CT ABDOMEN AND PELVIS WITHOUT CONTRAST TECHNIQUE: Multidetector CT imaging of the abdomen and pelvis was performed following the standard protocol without IV contrast. COMPARISON:  None. FINDINGS: Lower chest: No acute abnormality. Hepatobiliary: No focal liver abnormality is seen. No gallstones, gallbladder wall thickening, or biliary dilatation. Pancreas: Unremarkable. No pancreatic ductal dilatation or surrounding inflammatory changes. Spleen: Normal in size without focal abnormality. Adrenals/Urinary Tract: Normal adrenal glands. Left kidney lower pole 21 mm simple cyst. Left kidney upper pole 13 mm hemorrhagic cyst. Medullary nephrocalcinosis and caliceal stones. No left hydronephrosis. Normal bladder. Mild right pelvicaliectasis with a 13 mm stone at the right ureteropelvic junction (series 4, image 57). Stomach/Bowel: Stomach is within normal limits. Appendix appears normal. No evidence of bowel wall thickening, distention, or inflammatory changes. Small ring shaped focus of increased attenuation along the anterior fat of descending colon (series 2, image 46) compatible with sequelae of epiploic appendagitis. Vascular/Lymphatic: No significant vascular findings are present. No enlarged abdominal or pelvic lymph nodes. Reproductive: Uterus and bilateral adnexa are unremarkable. Other: No abdominal wall hernia or abnormality. No abdominopelvic ascites. Musculoskeletal: No acute or significant osseous findings. IMPRESSION: 1. Mild right pelvicaliectasis with 13 mm stone at the right ureteropelvic junction. 2.  Medullary nephrocalcinosis and multiple calyceal nephroliths. Electronically Signed   By: Mitzi Hansen M.D.   On: 12/25/2017 02:11    Procedures Procedures (including critical care time)  Medications Ordered in ED Medications  ondansetron (ZOFRAN) injection 4 mg (4 mg Intravenous Given 12/25/17 0027)  HYDROmorphone (DILAUDID) injection 1 mg (1 mg Intravenous Given 12/25/17 0037)  oxyCODONE-acetaminophen (PERCOCET/ROXICET) 5-325 MG per tablet 1 tablet (1 tablet Oral Given 12/25/17 0243)  ondansetron (ZOFRAN-ODT) disintegrating tablet 4 mg (4 mg Oral Given 12/25/17 0243)     Initial Impression / Assessment and Plan / ED Course  I have reviewed the triage vital signs and the nursing notes.  Pertinent labs & imaging results that were available during my care of the patient were reviewed by me and considered in my medical decision making (see chart for details).  Clinical Course as of Dec 26 354  Thu Dec 25, 2017  0227 Patient is a 13 mm stone per CT scan.  On reassessment, patient's pain is down to 4 out of 10 and he  is resting comfortably.  We will give patient oral pain medicine and reassess.  If patient continues to feel comfortable then we will discharge him otherwise we will speak with urology.  CT Renal Stone Study [AN]  [id number] Pain continues to be in good control. We will discharge. Strict ER return precautions have been discussed, and patient is agreeing with the plan and is comfortable with the workup done and the recommendations from the ER.    [AN]  0352 Likely due to his CKD  CO2(!): 15 [AN]    Clinical Course User Index [AN] Derwood KaplanNanavati, Henry Demeritt, MD    32 year old male with history of kidney stones comes in with chief complaint of right-sided flank pain.  Patient started having abdominal pain this evening, but at 9 PM he started having intense pain.  Patient is having no UTI-like symptoms.  He appears uncomfortable, in the past has required lithotripsy.  Unfortunately  ultrasound tech is already departed, therefore we will get a CT scan.  Pain control initiated.  Final Clinical Impressions(s) / ED Diagnoses   Final diagnoses:  Ureteral colic  UPJ (ureteropelvic junction) obstruction    ED Discharge Orders        Ordered    HYDROcodone-acetaminophen (NORCO/VICODIN) 5-325 MG tablet  Every 6 hours PRN     12/25/17 0354    ondansetron (ZOFRAN ODT) 8 MG disintegrating tablet  Every 8 hours PRN     12/25/17 0354    tamsulosin (FLOMAX) 0.4 MG CAPS capsule  Daily     12/25/17 0354    acetaminophen (TYLENOL 8 HOUR) 650 MG CR tablet  Every 8 hours     12/25/17 0354       Derwood KaplanNanavati, Brigetta Beckstrom, MD 12/25/17 [id number]    Derwood KaplanNanavati, Penny Arrambide, MD 12/25/17 [id number]    Derwood KaplanNanavati, Wave Calzada, MD 12/25/17 [phone]

## 2018-03-16 ENCOUNTER — Other Ambulatory Visit: Payer: Self-pay

## 2018-03-16 ENCOUNTER — Inpatient Hospital Stay (HOSPITAL_COMMUNITY): Payer: Self-pay

## 2018-03-16 ENCOUNTER — Encounter (HOSPITAL_COMMUNITY): Payer: Self-pay

## 2018-03-16 ENCOUNTER — Inpatient Hospital Stay (HOSPITAL_BASED_OUTPATIENT_CLINIC_OR_DEPARTMENT_OTHER)
Admission: EM | Admit: 2018-03-16 | Discharge: 2018-03-17 | DRG: 093 | Disposition: A | Discharge status: Home / Self Care | Payer: Self-pay | Attending: Internal Medicine | Admitting: Internal Medicine

## 2018-03-16 DIAGNOSIS — J40 Bronchitis, not specified as acute or chronic: Secondary | ICD-10-CM

## 2018-03-16 DIAGNOSIS — F17200 Nicotine dependence, unspecified, uncomplicated: Secondary | ICD-10-CM | POA: Diagnosis present

## 2018-03-16 DIAGNOSIS — R110 Nausea: Secondary | ICD-10-CM | POA: Diagnosis present

## 2018-03-16 DIAGNOSIS — Z79899 Other long term (current) drug therapy: Secondary | ICD-10-CM

## 2018-03-16 DIAGNOSIS — G8929 Other chronic pain: Secondary | ICD-10-CM | POA: Diagnosis present

## 2018-03-16 DIAGNOSIS — N400 Enlarged prostate without lower urinary tract symptoms: Secondary | ICD-10-CM | POA: Diagnosis present

## 2018-03-16 DIAGNOSIS — R109 Unspecified abdominal pain: Secondary | ICD-10-CM | POA: Diagnosis present

## 2018-03-16 DIAGNOSIS — Z87442 Personal history of urinary calculi: Secondary | ICD-10-CM

## 2018-03-16 DIAGNOSIS — E876 Hypokalemia: Secondary | ICD-10-CM

## 2018-03-16 DIAGNOSIS — Z881 Allergy status to other antibiotic agents status: Secondary | ICD-10-CM

## 2018-03-16 DIAGNOSIS — N183 Chronic kidney disease, stage 3 (moderate): Secondary | ICD-10-CM | POA: Diagnosis present

## 2018-03-16 DIAGNOSIS — Z20828 Contact with and (suspected) exposure to other viral communicable diseases: Secondary | ICD-10-CM | POA: Diagnosis present

## 2018-03-16 DIAGNOSIS — G723 Periodic paralysis: Principal | ICD-10-CM

## 2018-03-16 LAB — BASIC METABOLIC PANEL
Anion gap: 10 (ref 5–15)
Anion gap: 6 (ref 5–15)
BUN: 20 mg/dL (ref 6–20)
BUN: 19 mg/dL (ref 6–20)
CHLORIDE: 117 mmol/L — AB (ref 98–111)
CHLORIDE: 121 mmol/L — AB (ref 98–111)
CO2: 15 mmol/L — AB (ref 22–32)
CO2: 17 mmol/L — AB (ref 22–32)
CREATININE: 1.95 mg/dL — AB (ref 0.61–1.24)
Calcium: 8.3 mg/dL — ABNORMAL LOW (ref 8.9–10.3)
Calcium: 8.4 mg/dL — ABNORMAL LOW (ref 8.9–10.3)
Creatinine, Ser: 1.88 mg/dL — ABNORMAL HIGH (ref 0.61–1.24)
GFR calc Af Amer: 53 mL/min — ABNORMAL LOW (ref >60)
GFR calc non Af Amer: 44 mL/min — ABNORMAL LOW (ref >60)
GFR calc non Af Amer: 46 mL/min — ABNORMAL LOW (ref >60)
GFR, EST AFRICAN AMERICAN: 51 mL/min — AB (ref >60)
GLUCOSE: 142 mg/dL — AB (ref 70–99)
Glucose, Bld: 138 mg/dL — ABNORMAL HIGH (ref 70–99)
POTASSIUM: 3.3 mmol/L — AB (ref 3.5–5.1)
Potassium: 2.3 mmol/L — CL (ref 3.5–5.1)
SODIUM: 144 mmol/L (ref 135–145)
Sodium: 142 mmol/L (ref 135–145)

## 2018-03-16 LAB — CBC
HCT: 45.7 % (ref 39.0–52.0)
HEMOGLOBIN: 15.7 g/dL (ref 13.0–17.0)
MCH: 30.7 pg (ref 26.0–34.0)
MCHC: 34.4 g/dL (ref 30.0–36.0)
MCV: 89.3 fL (ref 78.0–100.0)
PLATELETS: 297 10*3/uL (ref 150–400)
RBC: 5.12 MIL/uL (ref 4.22–5.81)
RDW: 15.1 % (ref 11.5–15.5)
WBC: 10.4 10*3/uL (ref 4.0–10.5)

## 2018-03-16 LAB — CBC WITH DIFFERENTIAL/PLATELET
BASOS PCT: 1 %
Basophils Absolute: 0.1 10*3/uL (ref 0.0–0.1)
Eosinophils Absolute: 0.4 10*3/uL (ref 0.0–0.7)
Eosinophils Relative: 4 %
HEMATOCRIT: 48 % (ref 39.0–52.0)
HEMOGLOBIN: 16.6 g/dL (ref 13.0–17.0)
LYMPHS ABS: 2 10*3/uL (ref 0.7–4.0)
LYMPHS PCT: 19 %
MCH: 30.4 pg (ref 26.0–34.0)
MCHC: 34.6 g/dL (ref 30.0–36.0)
MCV: 87.9 fL (ref 78.0–100.0)
MONOS PCT: 7 %
Monocytes Absolute: 0.7 10*3/uL (ref 0.1–1.0)
NEUTROS ABS: 7.4 10*3/uL (ref 1.7–7.7)
NEUTROS PCT: 69 %
Platelets: 279 10*3/uL (ref 150–400)
RBC: 5.46 MIL/uL (ref 4.22–5.81)
RDW: 15.1 % (ref 11.5–15.5)
WBC: 10.5 10*3/uL (ref 4.0–10.5)

## 2018-03-16 LAB — INFLUENZA PANEL BY PCR (TYPE A & B)
Influenza A By PCR: NEGATIVE
Influenza B By PCR: NEGATIVE

## 2018-03-16 LAB — CREATININE, SERUM
CREATININE: 1.82 mg/dL — AB (ref 0.61–1.24)
GFR calc non Af Amer: 48 mL/min — ABNORMAL LOW (ref >60)
GFR, EST AFRICAN AMERICAN: 56 mL/min — AB (ref >60)

## 2018-03-16 LAB — MAGNESIUM: Magnesium: 2.3 mg/dL (ref 1.7–2.4)

## 2018-03-16 MED ORDER — POTASSIUM CHLORIDE 2 MEQ/ML IV SOLN
INTRAVENOUS | Status: AC
  Administered 2018-03-16 (×2): via INTRAVENOUS
  Filled 2018-03-16 (×3): qty 1000

## 2018-03-16 MED ORDER — POTASSIUM CHLORIDE 2 MEQ/ML IV SOLN
INTRAVENOUS | Status: DC
  Filled 2018-03-16: qty 1000

## 2018-03-16 MED ORDER — ENOXAPARIN SODIUM 40 MG/0.4ML ~~LOC~~ SOLN
40.0000 mg | SUBCUTANEOUS | Status: DC
  Administered 2018-03-16: 40 mg via SUBCUTANEOUS
  Filled 2018-03-16: qty 0.4

## 2018-03-16 MED ORDER — ONDANSETRON 4 MG PO TBDP: 8.0000 mg | ORAL_TABLET | Freq: Three times a day (TID) | ORAL | Status: DC | PRN

## 2018-03-16 MED ORDER — POTASSIUM CHLORIDE CRYS ER 20 MEQ PO TBCR
40.0000 meq | EXTENDED_RELEASE_TABLET | Freq: Once | ORAL | Status: AC
  Administered 2018-03-16: 40 meq via ORAL
  Filled 2018-03-16: qty 2

## 2018-03-16 MED ORDER — TAMSULOSIN HCL 0.4 MG PO CAPS
0.4000 mg | ORAL_CAPSULE | Freq: Every day | ORAL | Status: DC
  Administered 2018-03-17: 0.4 mg via ORAL
  Filled 2018-03-16: qty 1

## 2018-03-16 MED ORDER — POTASSIUM CHLORIDE 10 MEQ/100ML IV SOLN
10.0000 meq | INTRAVENOUS | Status: AC
  Administered 2018-03-16 (×2): 10 meq via INTRAVENOUS
  Filled 2018-03-16 (×3): qty 100

## 2018-03-16 MED ORDER — ACETAMINOPHEN 325 MG PO TABS
650.0000 mg | ORAL_TABLET | Freq: Three times a day (TID) | ORAL | Status: DC
  Administered 2018-03-16 – 2018-03-17 (×4): 650 mg via ORAL
  Filled 2018-03-16 (×4): qty 2

## 2018-03-16 MED ORDER — NICOTINE 21 MG/24HR TD PT24
21.0000 mg | MEDICATED_PATCH | Freq: Every day | TRANSDERMAL | Status: DC
  Administered 2018-03-16 – 2018-03-17 (×2): 21 mg via TRANSDERMAL
  Filled 2018-03-16 (×2): qty 1

## 2018-03-16 MED ORDER — SODIUM CHLORIDE 0.9 % IV SOLN
Freq: Once | INTRAVENOUS | Status: AC
  Administered 2018-03-16: 08:00:00 via INTRAVENOUS

## 2018-03-16 MED ORDER — GI COCKTAIL ~~LOC~~
30.0000 mL | Freq: Once | ORAL | Status: AC
  Administered 2018-03-16: 30 mL via ORAL
  Filled 2018-03-16: qty 30

## 2018-03-16 NOTE — ED Provider Notes (Signed)
[facility] EMERGENCY DEPARTMENT Provider Note   CSN: [id number] Arrival date & time: 03/16/18  [id number]     History   Chief Complaint Chief Complaint  Patient presents with  . Generalized Body Aches    HPI Merton BorderJoshua Buccellato is a 32 y.o. male.  HPI  32 year old male with a history of recurrent hypokalemia presents with a recurrent hypokalemia episode.  Started last night around 7:30 PM.  He has diffuse generalized extremity weakness and when trying to use his extremities induces pain.  This is happened to him many times for the last 10 years.  He states he is on potassium and has been taking this.  Since last night around 2 AM he is been having vomiting which does not usually occur.  However he denies any new abdominal pain or chest pain.  He is having some reflux.  No headaches or unilateral weakness. No diarrhea.  Past Medical History:  Diagnosis Date  . Hypokalemia   . Kidney stones   . Renal disorder     Patient Active Problem List   Diagnosis Date Noted  . 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 Hypokalemia 12/05/2017  . ARF (acute renal failure) (HCC) 12/05/2017  . Leucocytosis 12/05/2017  . Dehydration 12/05/2017    Past Surgical History:  Procedure Laterality Date  . LITHOTRIPSY          Home Medications    Prior to Admission medications   Medication Sig Start Date End Date Taking? Authorizing Provider  acetaminophen (TYLENOL 8 HOUR) 650 MG CR tablet Take 1 tablet (650 mg total) by mouth every 8 (eight) hours. 12/25/17   Derwood KaplanNanavati, Ankit, MD  HYDROcodone-acetaminophen (NORCO/VICODIN) 5-325 MG tablet Take 1 tablet by mouth every 6 (six) hours as needed. 12/25/17   Derwood KaplanNanavati, Ankit, MD  ondansetron (ZOFRAN ODT) 8 MG disintegrating tablet Take 1 tablet (8 mg total) by mouth every 8 (eight) hours as needed for nausea. 12/25/17   Derwood KaplanNanavati, Ankit, MD  potassium chloride 20 MEQ TBCR Take 20 mEq by mouth 2 (two) times daily. 12/06/17   Rolly SalterPatel, Pranav M, MD    tamsulosin (FLOMAX) 0.4 MG CAPS capsule Take 1 capsule (0.4 mg total) by mouth daily. 12/25/17   Derwood KaplanNanavati, Ankit, MD    Family History No family history on file.  Social History Social History   Tobacco Use  . Smoking status: Never Smoker  . Smokeless tobacco: Never Used  Substance Use Topics  . Alcohol use: Not on file  . Drug use: Never     Allergies   Ciprofloxacin and Zithromax [azithromycin]   Review of Systems Review of Systems  Constitutional: Negative for fever.  Cardiovascular: Negative for chest pain.  Gastrointestinal: Positive for vomiting. Negative for abdominal pain.  Neurological: Positive for weakness. Negative for headaches.  All other systems reviewed and are negative.    Physical Exam Updated Vital Signs BP 123/75 (BP Location: Right Arm)   Pulse 96   Temp 97.8 F (36.6 C) (Oral)   Resp 16   Ht 5\' 11"  (1.803 m)   Wt 113.4 kg   SpO2 95%   BMI 34.87 kg/m   Physical Exam  Constitutional: He is oriented to person, place, and time. He appears well-developed and well-nourished. No distress.  HENT:  Head: Normocephalic and atraumatic.  Right Ear: External ear normal.  Left Ear: External ear normal.  Nose: Nose normal.  Mouth/Throat: No oropharyngeal exudate.  Eyes: Right eye exhibits no discharge. Left eye exhibits no discharge.  Neck: Neck  supple.  Cardiovascular: Normal rate, regular rhythm and normal heart sounds.  Pulmonary/Chest: Effort normal and breath sounds normal.  Abdominal: Soft. There is no tenderness.  Musculoskeletal: He exhibits no edema.  Neurological: He is alert and oriented to person, place, and time.  4/5 strength in all 4 extremities.  Skin: Skin is warm and dry. He is not diaphoretic.  Nursing note and vitals reviewed.    ED Treatments / Results  Labs (all labs ordered are listed, but only abnormal results are displayed) Labs Reviewed  BASIC METABOLIC PANEL - Abnormal; Notable for the following components:       Result Value   Potassium 2.3 (*)    Chloride 117 (*)    CO2 15 (*)    Glucose, Bld 138 (*)    Creatinine, Ser 1.95 (*)    Calcium 8.3 (*)    GFR calc non Af Amer 44 (*)    GFR calc Af Amer 51 (*)    All other components within normal limits  CBC WITH DIFFERENTIAL/PLATELET  MAGNESIUM    EKG EKG Interpretation  Date/Time:  Monday March 16 2018 07:23:00 EDT Ventricular Rate:  85 PR Interval:    QRS Duration: 108 QT Interval:  367 QTC Calculation: 437 R Axis:   8 Text Interpretation:  Normal sinus rhythm no acute ST/T changes No old tracing to compare Confirmed by Pricilla Loveless [phone]) on 03/16/2018 7:24:51 AM   Radiology No results found.  Procedures .Critical Care Performed by: Pricilla Loveless, MD Authorized by: Pricilla Loveless, MD   Critical care provider statement:    Critical care time (minutes):  30   Critical care time was exclusive of:  Separately billable procedures and treating other patients   Critical care was necessary to treat or prevent imminent or life-threatening deterioration of the following conditions:  Metabolic crisis   Critical care was time spent personally by me on the following activities:  Development of treatment plan with patient or surrogate, discussions with consultants, evaluation of patient's response to treatment, examination of patient, obtaining history from patient or surrogate, ordering and performing treatments and interventions, ordering and review of laboratory studies, re-evaluation of patient's condition, pulse oximetry and review of old charts   (including critical care time)  Medications Ordered in ED Medications  potassium chloride 10 mEq in 100 mL IVPB (10 mEq Intravenous New Bag/Given 03/16/18 0738)  dextrose 5 % and 0.9% NaCl 1,000 mL with potassium chloride 60 mEq infusion (has no administration in time range)  potassium chloride SA (K-DUR,KLOR-CON) CR tablet 40 mEq (40 mEq Oral Given 03/16/18 0734)  gi cocktail  (Maalox,Lidocaine,Donnatal) (30 mLs Oral Given 03/16/18 0744)  0.9 %  sodium chloride infusion ( Intravenous IV Pump Association 03/16/18 0750)     Initial Impression / Assessment and Plan / ED Course  I have reviewed the triage vital signs and the nursing notes.  Pertinent labs & imaging results that were available during my care of the patient were reviewed by me and considered in my medical decision making (see chart for details).     Patient's lab work shows continued chronic kidney disease and recurrent hypokalemia.  Magnesium is 2.3.  Otherwise his lab work is unremarkable and his vital signs are unremarkable.  He had some vomiting in the night but no vomiting now and has no abdominal tenderness.  ECG without acute changes such as prolonged QT.  Given his diffuse weakness and significant hypokalemia, he will need replete meant and at least observation in the  hospital.  Has been started on PO and IV potassium here. Discussed with Dr. Mahala Menghini, who will admit to [facility].  Final Clinical Impressions(s) / ED Diagnoses   Final diagnoses:  Hypokalemia    ED Discharge Orders    None       Pricilla Loveless, MD 03/16/18 [phone]

## 2018-03-16 NOTE — H&P (Signed)
HPI  Adam BorderJoshua Weaver ZOX:[id number] DOB: 03/21/1986 DOA: 03/16/2018  PCP: Patient, No Pcp Per  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 Dur 40, started on saline D5 NS with 60 of K for 18 hours  Review of Systems:  Body aches and pai fever,   Denies visual changes, sore throat, rash, new muscle aches, chest pain, SOB, dysuria, bleeding, n/v/abdominal pain.  Past Medical History:  Diagnosis Date  . Hypokalemia   . Kidney stones   . Renal disorder     Past Surgical History:  Procedure Laterality Date  . LITHOTRIPSY       reports that he has never smoked. He has never used smokeless tobacco. He reports that he does not use drugs. His alcohol history is not on file. Mobility: Independent at baseline Lives with fianc and their combined 6 children Going to school for his Associates [place] to [place] and now lives here over the past year   Allergies  Allergen Reactions  . Ciprofloxacin   . Zithromax [Azithromycin]     No  family history on file. No family history of this issue Family history of kidney failure  Prior to Admission medications   Medication Sig Start Date End Date Taking? Authorizing Provider  acetaminophen (TYLENOL 8 HOUR) 650 MG CR tablet Take 1 tablet (650 mg total) by mouth every 8 (eight) hours. 12/25/17   Derwood KaplanNanavati, Ankit, MD  HYDROcodone-acetaminophen (NORCO/VICODIN) 5-325 MG tablet Take 1 tablet by mouth every 6 (six) hours as needed. 12/25/17   Derwood KaplanNanavati, Ankit, MD  ondansetron (ZOFRAN ODT) 8 MG disintegrating tablet Take 1 tablet (8 mg total) by mouth every 8 (eight) hours as needed for nausea. 12/25/17   Derwood KaplanNanavati, Ankit, MD  potassium chloride 20 MEQ TBCR Take 20 mEq by mouth 2 (two) times daily. 12/06/17   Rolly SalterPatel, Pranav M, MD  tamsulosin (FLOMAX) 0.4 MG CAPS capsule Take 1 capsule (0.4 mg total) by mouth daily. 12/25/17   Derwood KaplanNanavati, Ankit, MD    Physical Exam:  Vitals:   03/16/18 0644  BP: 123/75  Pulse: 96  Resp: 16  Temp: 97.8 F (36.6 C)  SpO2: 95%     EOMI NCAT throat clear  S1-S2 slightly tachycardic  Chest clinically clear no added sound  Abdomen soft no rebound no guarding  Power is diminished although still 5 on 5 he does feel somewhat weak and feels exhausted after sitting up or moving his hands and legs sensory is intact although his reflexes are hyperreflexive in the knees and the ankles  I have personally reviewed following labs and imaging studies  Labs:   Potassium 2.3 chloride 117 gap 15 BUN/creatinine 20/1.9 up from baseline 2 months ago 17/1.8  Imaging studies:   n   Medical tests:   EKG independently reviewed: Sinus rhythm PR interval 0.12 QRS axis is stable borderline Estes criterion for LVH but patient is below 35  Test discussed with performing physician:  y   Decision to obtain old records:   y   Review and summation of old records:   y   Active Problems:   * No active hospital problems. *   Assessment/Plan Periodic hypokalemic  paralysis Abdominal pains Chronic pain Nausea/question viral illness recently   Replace K aggressively with IV K for 18 hours in addition to runs of K already given in the ED + K. Dur Repeat potassium in 6 hours and will adjust meds accordingly Potassium is normal Will need nephrology input at some point I will obtain records from [facility] prior to calling them and they will need to be consulted for set up of care here in [place] as this is his second admission Keep on telemetry overnight  Recent viral illness-we will get a flu swab as well as a chest x-ray  Body mass index is 34.87 kg/m.  Needs outpatient characterization  BPH-continue Flomax   Severity of Illness: The appropriate patient status for this patient is INPATIENT. Inpatient status is judged to be reasonable and necessary in order to provide the required intensity of service to ensure the patient's safety. The patient's presenting symptoms, physical exam findings, and initial radiographic and laboratory data in the context of their chronic comorbidities is felt to place them at high risk for further clinical deterioration. Furthermore, it is not anticipated that the patient will be medically stable for discharge from the hospital within 2 midnights of admission. The following factors support the patient status of inpatient.   " The patient's presenting symptoms include hypokalemia. " The worrisome physical exam findings include cough and exposure to ill contacts. " The initial radiographic and laboratory data are worrisome because of possible pneumonia. " The chronic co-morbidities include multiple.   * I certify that at the point of admission it is my clinical judgment that the patient will require inpatient hospital care spanning beyond 2 midnights from the point of admission due to high intensity of service, high risk for further deterioration and high frequency of surveillance required.*     DVT  prophylaxis: Lovenox Code Status: Full Family Communication: None Consults called: None  Time spent: 45 minutes  Kaelei Wheeler, MD  [facility] Direct contact: [phone] --Via amion app OR  --[email or web address]; password [email or web address]  7PM-7AM contact night coverage as above  03/16/2018, 7:52 AM

## 2018-03-16 NOTE — ED Triage Notes (Signed)
Pt reports generalized body pain 7/10 since yesterday evening. Pt states "I am concerned that I am having a hypokalemic episode." Pt reports hx of CKD and Paralytic Hypokalemia. Pt A+OX4, ambulatory independently.

## 2018-03-16 NOTE — Progress Notes (Signed)
Request for medical record from Dr. Roland EarlMoussallem-Renal care associate faxed, waiting for records.

## 2018-03-17 ENCOUNTER — Encounter (HOSPITAL_COMMUNITY): Payer: Self-pay | Admitting: Nephrology

## 2018-03-17 DIAGNOSIS — J40 Bronchitis, not specified as acute or chronic: Secondary | ICD-10-CM

## 2018-03-17 LAB — CBC WITH DIFFERENTIAL/PLATELET
BASOS ABS: 0 10*3/uL (ref 0.0–0.1)
BASOS PCT: 1 %
Eosinophils Absolute: 0.5 10*3/uL (ref 0.0–0.7)
Eosinophils Relative: 6 %
HCT: 46.5 % (ref 39.0–52.0)
HEMOGLOBIN: 15.4 g/dL (ref 13.0–17.0)
LYMPHS PCT: 29 %
Lymphs Abs: 2.2 10*3/uL (ref 0.7–4.0)
MCH: 30.3 pg (ref 26.0–34.0)
MCHC: 33.1 g/dL (ref 30.0–36.0)
MCV: 91.4 fL (ref 78.0–100.0)
Monocytes Absolute: 0.7 10*3/uL (ref 0.1–1.0)
Monocytes Relative: 9 %
NEUTROS PCT: 55 %
Neutro Abs: 4.3 10*3/uL (ref 1.7–7.7)
Platelets: 270 10*3/uL (ref 150–400)
RBC: 5.09 MIL/uL (ref 4.22–5.81)
RDW: 15.5 % (ref 11.5–15.5)
WBC: 7.7 10*3/uL (ref 4.0–10.5)

## 2018-03-17 LAB — COMPREHENSIVE METABOLIC PANEL
ALBUMIN: 3.3 g/dL — AB (ref 3.5–5.0)
ALK PHOS: 77 U/L (ref 38–126)
ALT: 16 U/L (ref 0–44)
AST: 14 U/L — AB (ref 15–41)
Anion gap: 5 (ref 5–15)
BILIRUBIN TOTAL: 0.5 mg/dL (ref 0.3–1.2)
BUN: 15 mg/dL (ref 6–20)
CO2: 15 mmol/L — ABNORMAL LOW (ref 22–32)
CREATININE: 1.6 mg/dL — AB (ref 0.61–1.24)
Calcium: 8 mg/dL — ABNORMAL LOW (ref 8.9–10.3)
Chloride: 123 mmol/L — ABNORMAL HIGH (ref 98–111)
GFR calc Af Amer: >60 mL/min (ref >60)
GFR calc non Af Amer: 56 mL/min — ABNORMAL LOW (ref >60)
GLUCOSE: 126 mg/dL — AB (ref 70–99)
POTASSIUM: 3.5 mmol/L (ref 3.5–5.1)
Sodium: 143 mmol/L (ref 135–145)
Total Protein: 5.9 g/dL — ABNORMAL LOW (ref 6.5–8.1)

## 2018-03-17 MED ORDER — POTASSIUM CHLORIDE 2 MEQ/ML IV SOLN
INTRAVENOUS | Status: AC
  Administered 2018-03-17: 05:00:00 via INTRAVENOUS
  Filled 2018-03-17: qty 1000

## 2018-03-17 MED ORDER — NICOTINE 21 MG/24HR TD PT24: 21.0000 mg | MEDICATED_PATCH | Freq: Every day | TRANSDERMAL | 0 refills | Status: DC

## 2018-03-17 MED ORDER — POTASSIUM CHLORIDE ER 10 MEQ PO TBCR: 40.0000 meq | EXTENDED_RELEASE_TABLET | Freq: Every day | ORAL | 0 refills | Status: DC

## 2018-03-17 MED ORDER — POTASSIUM CHLORIDE ER 10 MEQ PO TBCR: 20.0000 meq | EXTENDED_RELEASE_TABLET | Freq: Every day | ORAL | 0 refills | Status: DC

## 2018-03-17 NOTE — Discharge Instructions (Signed)
Potassium Content of Foods Potassium is a mineral found in many foods and drinks. It helps keep fluids and minerals balanced in your body and affects how steadily your heart beats. Potassium also helps control your blood pressure and keep your muscles and nervous system healthy. Certain health conditions and medicines may change the balance of potassium in your body. When this happens, you can help balance your level of potassium through the foods that you do or do not eat. Your health care provider or dietitian may recommend an amount of potassium that you should have each day. The following lists of foods provide the amount of potassium (in parentheses) per serving in each item. High in potassium The following foods and beverages have 200 mg or more of potassium per serving:  Apricots, 2 raw or 5 dry (200 mg).  Artichoke, 1 medium (345 mg).  Avocado, raw,  each (245 mg).  Banana, 1 medium (425 mg).  Beans, lima, or baked beans, canned,  cup (280 mg).  Beans, white, canned,  cup (595 mg).  Beef roast, 3 oz (320 mg).  Beef, ground, 3 oz (270 mg).  Beets, raw or cooked,  cup (260 mg).  Bran muffin, 2 oz (300 mg).  Broccoli,  cup (230 mg).  Brussels sprouts,  cup (250 mg).  Cantaloupe,  cup (215 mg).  Cereal, 100% bran,  cup (200-400 mg).  Cheeseburger, single, fast food, 1 each (225-400 mg).  Chicken, 3 oz (220 mg).  Clams, canned, 3 oz (535 mg).  Crab, 3 oz (225 mg).  Dates, 5 each (270 mg).  Dried beans and peas,  cup (300-475 mg).  Figs, dried, 2 each (260 mg).  Fish: halibut, tuna, cod, snapper, 3 oz (480 mg).  Fish: salmon, haddock, swordfish, perch, 3 oz (300 mg).  Fish, tuna, canned 3 oz (200 mg).  [place] fries, fast food, 3 oz (470 mg).  Granola with fruit and nuts,  cup (200 mg).  Grapefruit juice,  cup (200 mg).  Greens, beet,  cup (655 mg).  Honeydew melon,  cup (200 mg).  Kale, raw, 1 cup (300 mg).  Kiwi, 1 medium (240  mg).  Kohlrabi, rutabaga, parsnips,  cup (280 mg).  Lentils,  cup (365 mg).  Mango, 1 each (325 mg).  Milk, chocolate, 1 cup (420 mg).  Milk: nonfat, low-fat, whole, buttermilk, 1 cup (350-380 mg).  Molasses, 1 Tbsp (295 mg).  Mushrooms,  cup (280) mg.  Nectarine, 1 each (275 mg).  Nuts: almonds, peanuts, hazelnuts, [place], cashew, mixed, 1 oz (200 mg).  Nuts, pistachios, 1 oz (295 mg).  Orange, 1 each (240 mg).  Orange juice,  cup (235 mg).  Papaya, medium,  fruit (390 mg).  Peanut butter, chunky, 2 Tbsp (240 mg).  Peanut butter, smooth, 2 Tbsp (210 mg).  Pear, 1 medium (200 mg).  Pomegranate, 1 whole (400 mg).  Pomegranate juice,  cup (215 mg).  Pork, 3 oz (350 mg).  Potato chips, salted, 1 oz (465 mg).  Potato, baked with skin, 1 medium (925 mg).  Potatoes, boiled,  cup (255 mg).  Potatoes, mashed,  cup (330 mg).  Prune juice,  cup (370 mg).  Prunes, 5 each (305 mg).  Pudding, chocolate,  cup (230 mg).  Pumpkin, canned,  cup (250 mg).  Raisins, seedless,  cup (270 mg).  Seeds, sunflower or pumpkin, 1 oz (240 mg).  Soy milk, 1 cup (300 mg).  Spinach,  cup (420 mg).  Spinach, canned,  cup (370 mg).  Sweet  potato, baked with skin, 1 medium (450 mg).  Swiss chard,  cup (480 mg).  Tomato or vegetable juice,  cup (275 mg).  Tomato sauce or puree,  cup (400-550 mg).  Tomato, raw, 1 medium (290 mg).  Tomatoes, canned,  cup (200-300 mg).  [place], 3 oz (250 mg).  Wheat germ, 1 oz (250 mg).  Winter squash,  cup (250 mg).  Yogurt, plain or fruited, 6 oz (260-435 mg).  Zucchini,  cup (220 mg).  Moderate in potassium The following foods and beverages have 50-200 mg of potassium per serving:  Apple, 1 each (150 mg).  Apple juice,  cup (150 mg).  Applesauce,  cup (90 mg).  Apricot nectar,  cup (140 mg).  Asparagus, small spears,  cup or 6 spears (155 mg).  Bagel, cinnamon raisin, 1 each (130 mg).  Bagel,  egg or plain, 4 in., 1 each (70 mg).  Beans, green,  cup (90 mg).  Beans, yellow,  cup (190 mg).  Beer, regular, 12 oz (100 mg).  Beets, canned,  cup (125 mg).  Blackberries,  cup (115 mg).  Blueberries,  cup (60 mg).  Bread, whole wheat, 1 slice (70 mg).  Broccoli, raw,  cup (145 mg).  Cabbage,  cup (150 mg).  Carrots, cooked or raw,  cup (180 mg).  Cauliflower, raw,  cup (150 mg).  Celery, raw,  cup (155 mg).  Cereal, bran flakes, cup (120-150 mg).  Cheese, cottage,  cup (110 mg).  Cherries, 10 each (150 mg).  Chocolate, 1 oz bar (165 mg).  Coffee, brewed 6 oz (90 mg).  Corn,  cup or 1 ear (195 mg).  Cucumbers,  cup (80 mg).  Egg, large, 1 each (60 mg).  Eggplant,  cup (60 mg).  Endive, raw, cup (80 mg).  English muffin, 1 each (65 mg).  Fish, orange roughy, 3 oz (150 mg).  Frankfurter, beef or pork, 1 each (75 mg).  Fruit cocktail,  cup (115 mg).  Grape juice,  cup (170 mg).  Grapefruit,  fruit (175 mg).  Grapes,  cup (155 mg).  Greens: kale, turnip, collard,  cup (110-150 mg).  Ice cream or frozen yogurt, chocolate,  cup (175 mg).  Ice cream or frozen yogurt, vanilla,  cup (120-150 mg).  Lemons, limes, 1 each (80 mg).  Lettuce, all types, 1 cup (100 mg).  Mixed vegetables,  cup (150 mg).  Mushrooms, raw,  cup (110 mg).  Nuts: walnuts, pecans, or macadamia, 1 oz (125 mg).  Oatmeal,  cup (80 mg).  Okra,  cup (110 mg).  Onions, raw,  cup (120 mg).  Peach, 1 each (185 mg).  Peaches, canned,  cup (120 mg).  Pears, canned,  cup (120 mg).  Peas, green, frozen,  cup (90 mg).  Peppers, green,  cup (130 mg).  Peppers, red,  cup (160 mg).  Pineapple juice,  cup (165 mg).  Pineapple, fresh or canned,  cup (100 mg).  Plums, 1 each (105 mg).  Pudding, vanilla,  cup (150 mg).  Raspberries,  cup (90 mg).  Rhubarb,  cup (115 mg).  Rice, wild,  cup (80 mg).  Shrimp, 3 oz (155  mg).  Spinach, raw, 1 cup (170 mg).  Strawberries,  cup (125 mg).  Summer squash  cup (175-200 mg).  Swiss chard, raw, 1 cup (135 mg).  Tangerines, 1 each (140 mg).  Tea, brewed, 6 oz (65 mg).  Turnips,  cup (140 mg).  Watermelon,  cup (85 mg).  Wine, red, table,  5 oz (180 mg).  Wine, white, table, 5 oz (100 mg).  Low in potassium The following foods and beverages have less than 50 mg of potassium per serving.  Bread, white, 1 slice (30 mg).  Carbonated beverages, 12 oz (less than 5 mg).  Cheese, 1 oz (20-30 mg).  Cranberries,  cup (45 mg).  Cranberry juice cocktail,  cup (20 mg).  Fats and oils, 1 Tbsp (less than 5 mg).  Hummus, 1 Tbsp (32 mg).  Nectar: papaya, mango, or pear,  cup (35 mg).  Rice, white or brown,  cup (50 mg).  Spaghetti or macaroni,  cup cooked (30 mg).  Tortilla, flour or corn, 1 each (50 mg).  Waffle, 4 in., 1 each (50 mg).  Water chestnuts,  cup (40 mg).  This information is not intended to replace advice given to you by your health care provider. Make sure you discuss any questions you have with your health care provider. Document Released: 01/29/2005 Document Revised: 11/23/2015 Document Reviewed: 05/14/2013 Elsevier Interactive Patient Education  2018 [employer].   Hypokalemia Hypokalemia means that the amount of potassium in the blood is lower than normal.Potassium is a chemical that helps regulate the amount of fluid in the body (electrolyte). It also stimulates muscle tightening (contraction) and helps nerves work properly.Normally, most of the bodys potassium is inside of cells, and only a very small amount is in the blood. Because the amount in the blood is so small, minor changes to potassium levels in the blood can be life-threatening. What are the causes? This condition may be caused by:  Antibiotic medicine.  Diarrhea or vomiting. Taking too much of a medicine that helps you have a bowel movement  (laxative) can cause diarrhea and lead to hypokalemia.  Chronic kidney disease (CKD).  Medicines that help the body get rid of excess fluid (diuretics).  Eating disorders, such as bulimia.  Low magnesium levels in the body.  Sweating a lot.  What are the signs or symptoms? Symptoms of this condition include:  Weakness.  Constipation.  Fatigue.  Muscle cramps.  Mental confusion.  Skipped heartbeats or irregular heartbeat (palpitations).  Tingling or numbness.  How is this diagnosed? This condition is diagnosed with a blood test. How is this treated? Hypokalemia can be treated by taking potassium supplements by mouth or adjusting the medicines that you take. Treatment may also include eating more foods that contain a lot of potassium. If your potassium level is very low, you may need to get potassium through an IV tube in one of your veins and be monitored in the hospital. Follow these instructions at home:  Take over-the-counter and prescription medicines only as told by your health care provider. This includes vitamins and supplements.  Eat a healthy diet. A healthy diet includes fresh fruits and vegetables, whole grains, healthy fats, and lean proteins.  If instructed, eat more foods that contain a lot of potassium, such as: ? Nuts, such as peanuts and pistachios. ? Seeds, such as sunflower seeds and pumpkin seeds. ? Peas, lentils, and lima beans. ? Whole grain and bran cereals and breads. ? Fresh fruits and vegetables, such as apricots, avocado, bananas, cantaloupe, kiwi, oranges, tomatoes, asparagus, and potatoes. ? Orange juice. ? Tomato juice. ? Red meats. ? Yogurt.  Keep all follow-up visits as told by your health care provider. This is important. Contact a health care provider if:  You have weakness that gets worse.  You feel your heart pounding or racing.  You vomit.  You have diarrhea.  You have diabetes (diabetes mellitus) and you have trouble  keeping your blood sugar (glucose) in your target range. Get help right away if:  You have chest pain.  You have shortness of breath.  You have vomiting or diarrhea that lasts for more than 2 days.  You faint. This information is not intended to replace advice given to you by your health care provider. Make sure you discuss any questions you have with your health care provider. Document Released: 06/17/2005 Document Revised: 02/03/2016 Document Reviewed: 02/03/2016 Elsevier Interactive Patient Education  2018 [employer]

## 2018-03-17 NOTE — Care Management Note (Signed)
Case Management Note  Patient Details  Name: Merton BorderJoshua Servellon MRN: [id number] Date of Birth: 09/26/1985  Subjective/Objective[place]  [place] No CM needs.                  Action/Plan:dc home.   Expected Discharge Date:  03/17/18               Expected Discharge Plan:  Home/Self Care  In-House Referral:     Discharge planning Services  CM Consult  Post Acute Care Choice:    Choice offered to:     DME Arranged:    DME Agency:     HH Arranged:    HH Agency:     Status of Service:  Completed, signed off  If discussed at [employer] Length of Stay Meetings, dates discussed:    Additional Comments:  Lanier ClamMahabir, Chrsitopher Wik, RN 03/17/2018, 11:31 AM

## 2018-03-17 NOTE — Discharge Summary (Addendum)
Physician Discharge Summary  Adam BorderJoshua Weaver FAO:[id number] DOB: 12/04/1985 DOA: 03/16/2018  PCP: Patient, No Pcp Per  Admit date: 03/16/2018 Discharge date: 03/17/2018  Admitted From: Home Disposition: Home Recommendations for Outpatient Follow-up:  1. Follow up with PCP in 1-2 weeks 2. Please obtain BMP/CBC in one week 3. Follow-up with the nephrologist as an outpatient.  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 Dur 40, started on saline D5 NS with 60 of K for 18 hours  Discharge Diagnoses:  Active Problems:   Hypokalemic periodic paralysis   Familial hypokalemic periodic paralysis  #1 patient admitted with hypokalemic periodic paralysis patient with a history of the same with multiple admissions in the hospital.  Patient from [place] does not have a PCP.  Discussed with nephrology who will see the patient today and arrange follow-up with a nephrologist.  I have given him information about community health and wellness to  follow-up labs.  Upon admission his potassium was 2.3.  I will discharge him on K-Dur 40 mEq daily along with the potassium he is taking at home.  His potassium on discharge is 3.5.  Today he is awake alert ambulating in the room.    #?2 bronchitis chest x-ray negative.  No indication for antibiotics at this time.  He is a smoker advised him to quit smoking.  Discharge Instructions  Discharge Instructions    Call MD for:  difficulty breathing, headache or visual disturbances   Complete by:  As directed    Call MD for:  persistant nausea and vomiting   Complete by:  As directed    Call MD for:  severe uncontrolled pain   Complete by:  As directed    Diet - low sodium heart healthy   Complete by:  As directed    Increase activity slowly   Complete by:  As directed      Allergies as of 03/17/2018      Reactions   Zithromax [azithromycin] Anaphylaxis   Ciprofloxacin Hives      Medication List    STOP taking these medications   acetaminophen 650 MG CR tablet Commonly known as:  TYLENOL   HYDROcodone-acetaminophen 5-325 MG tablet Commonly known as:  NORCO/VICODIN   ondansetron 8 MG disintegrating tablet Commonly known as:  ZOFRAN-ODT   tamsulosin 0.4 MG Caps capsule Commonly known as:  FLOMAX     TAKE these medications   nicotine 21 mg/24hr patch Commonly known as:  NICODERM CQ - dosed in mg/24 hours Place 1 patch (21 mg total) onto the skin daily.  Start taking on:  03/18/2018   Potassium 99 MG Tabs Take 9 tablets by mouth 2 (two) times daily.   potassium chloride 10 MEQ tablet Commonly known as:  K-DUR Take 2 tablets (20 mEq total) by mouth daily. What changed:    medication strength  when to take this      Follow-up Information    [facility] Follow up.   Contact information: [address] [place] [address] [phone]         Allergies  Allergen Reactions  . Zithromax [Azithromycin] Anaphylaxis   . Ciprofloxacin Hives    Consultations: Dr. Arlean Hopping Procedures/Studies: Dg Chest 2 View  Result Date: 03/16/2018 CLINICAL DATA:  Cough and congestion EXAM: CHEST - 2 VIEW COMPARISON:  None. FINDINGS: The heart size and mediastinal contours are within normal limits. Both lungs are clear. The visualized skeletal structures are unremarkable. IMPRESSION: No active cardiopulmonary disease. Electronically Signed   By: Alcide Clever M.D.   On: 03/16/2018 12:23    (Echo, Carotid, EGD, Colonoscopy, ERCP)    Subjective:   Discharge Exam: Vitals:   03/16/18 2024 03/17/18 0505  BP: 128/75 126/87  Pulse: 78 72  Resp: 18 20  Temp: 98.2 F (36.8 C) (!) 97.5 F (36.4 C)  SpO2: 97% 99%   Vitals:   03/16/18 1019 03/16/18 1303 03/16/18 2024 03/17/18 0505  BP: (!) 147/96 126/74 128/75 126/87  Pulse: 70 61 78 72  Resp: 18  18 20   Temp: 97.9 F (36.6 C) 97.8 F (36.6 C) 98.2 F (36.8 C) (!) 97.5 F (36.4 C)  TempSrc: Oral  Oral Oral  SpO2: 98% 97% 97% 99%  Weight:      Height:        General: Pt is alert, awake, not in acute distress Cardiovascular: RRR, S1/S2 +, no rubs, no gallops Respiratory: CTA bilaterally, no wheezing, no rhonchi Abdominal: Soft, NT, ND, bowel sounds + Extremities: no edema, no cyanosis    The results of significant diagnostics from this hospitalization (including imaging, microbiology, ancillary and laboratory) are listed below for reference.     Microbiology: No results found for this or any previous visit (from the past 240 hour(s)).   Labs: BNP (last 3 results) No results for input(s): BNP in the last 8760 hours. Basic Metabolic Panel: Recent Labs  Lab 03/16/18 0650 03/16/18 1204 03/16/18 1441 03/17/18 0521  NA 142  --  144 143  K 2.3*  --  3.3* 3.5  CL 117*  --  121* 123*  CO2 15*  --  17* 15*  GLUCOSE 138*  --  142* 126*  BUN 20  --  19 15  CREATININE 1.95* 1.82* 1.88* 1.60*  CALCIUM 8.3*  --  8.4* 8.0*  MG 2.3  --   --   --     Liver Function Tests: Recent Labs  Lab 03/17/18 0521  AST 14*  ALT 16  ALKPHOS 77  BILITOT 0.5  PROT 5.9*  ALBUMIN 3.3*   No results for input(s): LIPASE, AMYLASE in the last 168 hours. No results for input(s): AMMONIA in the last 168 hours. CBC: Recent Labs  Lab 03/16/18 0650 03/16/18 1204 03/17/18 0521  WBC 10.5 10.4 7.7  NEUTROABS 7.4  --  4.3  HGB 16.6 15.7 15.4  HCT 48.0 45.7 46.5  MCV 87.9 89.3 91.4  PLT 279 297 270   Cardiac Enzymes: No results for input(s): CKTOTAL, CKMB, CKMBINDEX, TROPONINI in the last 168 hours. BNP:  Invalid input(s): POCBNP CBG: No results for input(s): GLUCAP in the last 168 hours. D-Dimer No results for input(s): DDIMER in the last 72 hours. Hgb A1c No results for input(s): HGBA1C in the last 72 hours. Lipid Profile No results for input(s): CHOL, HDL, LDLCALC, TRIG, CHOLHDL, LDLDIRECT in the last 72 hours. Thyroid function studies No results for input(s): TSH, T4TOTAL, T3FREE, THYROIDAB in the last 72 hours.  Invalid input(s): FREET3 Anemia work up No results for input(s): VITAMINB12, FOLATE, FERRITIN, TIBC, IRON, RETICCTPCT in the last 72 hours. Urinalysis    Component Value Date/Time   COLORURINE YELLOW 12/24/2017 2312   APPEARANCEUR CLEAR 12/24/2017 2312   LABSPEC 1.010 12/24/2017 2312   PHURINE 6.5 12/24/2017 2312   GLUCOSEU NEGATIVE 12/24/2017 2312   HGBUR LARGE (A) 12/24/2017 2312   BILIRUBINUR NEGATIVE 12/24/2017 2312   KETONESUR NEGATIVE 12/24/2017 2312   PROTEINUR NEGATIVE 12/24/2017 2312   NITRITE NEGATIVE 12/24/2017 2312   LEUKOCYTESUR MODERATE (A) 12/24/2017 2312   Sepsis Labs Invalid input(s): PROCALCITONIN,  WBC,  LACTICIDVEN Microbiology No results found for this or any previous visit (from the past 240 hour(s)).   Time coordinating discharge: 38 minutes  SIGNED:   Alwyn Ren, MD  [facility] 03/17/2018, 10:59 AM Pager   If 7PM-7AM, please contact  night-coverage [email or web address] Password [email or web address]

## 2018-03-17 NOTE — Consult Note (Signed)
Renal Service Consult Note [facility]  Adam BorderJoshua Weaver 03/17/2018 Adam Krabbeobert D Elena Weaver Requesting Physician:  Dr Adam Weaver  Reason for Consult:  Hypokalemia/ CKD HPI: The patient is a 32 y.o. year-old w/ hx of hypokalemia, CKD III, kidney stones who moved here to [place] from [place] recently and is going to live here now.  He was admitted with muscle weakness diffusely and low K+.  In hospital K+ levels have responded to supplementation, he is feeling back to baseline and is for dc today.  Needs local nephrologist, we are asked to patient for this.    Pt first had episode of muscles weakness around age 32, since then he states has been admitted 10- 20 times over the last 10 yrs for exacerbation of hypokalemia/ musc weakness.  He says the usual triggers are energy drinks or soda, and beer, but not sweets.  He says it is unpredictable as well.  He has hx of "lots of kidney stones, big ones" also onset in his early 20's.  Has had lithotripsy.  Not sure if this is still a problem.  States he had stage 3 kidney failure as well.  Has been seeing a nephrologist for 10 yrs, but last seen 2 yrs ago because he lost his Medicaid and wasn't able/ willing to pay for all the tests.    +tob, minimal etoh, engaged, [place].     ROS  denies CP  no joint pain   no HA  no blurry vision  no rash  no diarrhea  no nausea/ vomiting  no dysuria  no difficulty voiding  no change in urine color    Past Medical History  Past Medical History:  Diagnosis Date  . Hypokalemia   . Kidney stones   . Renal disorder    Past Surgical History  Past Surgical History:  Procedure Laterality Date  . LITHOTRIPSY     Family History History reviewed. No pertinent family history. Social History  reports that he has never smoked. He has never used smokeless tobacco. He reports that he does not use drugs. His alcohol history is not on file. Allergies  Allergies  Allergen  Reactions  . Zithromax [Azithromycin] Anaphylaxis  . Ciprofloxacin Hives   Home medications Prior to Admission medications   Medication Sig Start Date End Date Taking? Authorizing Provider  Potassium 99 MG TABS Take 9 tablets by mouth 2 (two) times daily.    Yes [provider]  acetaminophen (TYLENOL 8 HOUR) 650 MG CR tablet Take 1 tablet (650 mg total) by mouth every 8 (eight) hours. Patient not taking: Reported on 03/16/2018 12/25/17   Adam Weaver, Ankit, MD  HYDROcodone-acetaminophen (NORCO/VICODIN) 5-325 MG tablet Take 1 tablet by mouth every 6 (six) hours as needed. Patient not taking: Reported on 03/16/2018 12/25/17   Adam Weaver, Ankit, MD  nicotine (NICODERM CQ - DOSED IN MG/24 HOURS) 21 mg/24hr patch Place 1 patch (21 mg total) onto the skin daily. 03/18/18   Adam Weaver, Adam G, MD  ondansetron (ZOFRAN ODT) 8 MG disintegrating tablet Take 1 tablet (8 mg total) by mouth every 8 (eight) hours as needed for nausea. Patient not taking: Reported on 03/16/2018 12/25/17   Adam Weaver, Ankit, MD  potassium chloride (K-DUR) 10 MEQ tablet Take 4 tablets (40 mEq total) by mouth daily. 03/17/18   Adam Weaver, Adam G, MD  potassium chloride 20 MEQ TBCR Take 20 mEq by mouth 2 (two) times daily. Patient not taking: Reported on 03/16/2018 12/06/17  Adam Salter, MD  tamsulosin (FLOMAX) 0.4 MG CAPS capsule Take 1 capsule (0.4 mg total) by mouth daily. Patient not taking: Reported on 03/16/2018 12/25/17   Adam Kaplan, MD   Liver Function Tests Recent Labs  Lab 03/17/18 0521  AST 14*  ALT 16  ALKPHOS 77  BILITOT 0.5  PROT 5.9*  ALBUMIN 3.3*   No results for input(s): LIPASE, AMYLASE in the last 168 hours. CBC Recent Labs  Lab 03/16/18 0650 03/16/18 1204 03/17/18 0521  WBC 10.5 10.4 7.7  NEUTROABS 7.4  --  4.3  HGB 16.6 15.7 15.4  HCT 48.0 45.7 46.5  MCV 87.9 89.3 91.4  PLT 279 297 270   Basic Metabolic Panel Recent Labs  Lab 03/16/18 0650 03/16/18 1204 03/16/18 1441  03/17/18 0521  NA 142  --  144 143  K 2.3*  --  3.3* 3.5  CL 117*  --  121* 123*  CO2 15*  --  17* 15*  GLUCOSE 138*  --  142* 126*  BUN 20  --  19 15  CREATININE 1.95* 1.82* 1.88* 1.60*  CALCIUM 8.3*  --  8.4* 8.0*   Iron/TIBC/Ferritin/ %Sat No results found for: IRON, TIBC, FERRITIN, IRONPCTSAT  Vitals:   03/17/18 0505 03/17/18 0930 03/17/18 0931 03/17/18 1322  BP: 126/87 130/86  133/84  Pulse: 72 97  77  Resp: 20 16  (!) 21  Temp: (!) 97.5 F (36.4 C) 98 F (36.7 C)  98.4 F (36.9 C)  TempSrc: Oral Oral  Oral  SpO2: 99% 95% 95% 94%  Weight:      Height:       Exam Gen alert, obese, no distress, calm and pleasant No rash, cyanosis or gangrene Sclera anicteric, throat clear  No jvd or bruits Chest clear bilat RRR no MRG Abd soft ntnd no mass or ascites +bs GU normal male MS no joint effusions or deformity Ext no leg or UE edema, no wounds or ulcers Neuro is alert, Ox 3 , nf    Home meds:  - flomax 0.4mg  / Potassium 99mg  otc takes 6 bid     Impression: 1. Hypokalemia , recurrent - w/ assoc musc weakness. This episode is resolving.   2. CKD III - creat at baseline around 1.5- 1.9 3. Hx kidney stones    Plan - our office will call him to set up nephrology appt in 1-2 mos.  Continue home K+ he has been taking (potassium 99mg  otc 6 tabs bid).  Will sign off.   Adam Moselle MD [employer] pager [phone]   03/17/2018, 3:26 PM

## 2018-06-10 ENCOUNTER — Encounter (HOSPITAL_BASED_OUTPATIENT_CLINIC_OR_DEPARTMENT_OTHER): Payer: Self-pay

## 2018-06-10 ENCOUNTER — Other Ambulatory Visit: Payer: Self-pay

## 2018-06-10 ENCOUNTER — Emergency Department (HOSPITAL_BASED_OUTPATIENT_CLINIC_OR_DEPARTMENT_OTHER)
Admission: EM | Admit: 2018-06-10 | Discharge: 2018-06-10 | Disposition: A | Discharge status: Home / Self Care | Payer: Self-pay | Attending: Emergency Medicine | Admitting: Emergency Medicine

## 2018-06-10 DIAGNOSIS — J111 Influenza due to unidentified influenza virus with other respiratory manifestations: Secondary | ICD-10-CM | POA: Insufficient documentation

## 2018-06-10 DIAGNOSIS — Z79899 Other long term (current) drug therapy: Secondary | ICD-10-CM | POA: Insufficient documentation

## 2018-06-10 DIAGNOSIS — B349 Viral infection, unspecified: Secondary | ICD-10-CM

## 2018-06-10 DIAGNOSIS — F17210 Nicotine dependence, cigarettes, uncomplicated: Secondary | ICD-10-CM | POA: Insufficient documentation

## 2018-06-10 MED ORDER — ONDANSETRON 4 MG PO TBDP: 4.0000 mg | ORAL_TABLET | Freq: Three times a day (TID) | ORAL | 0 refills | Status: DC | PRN

## 2018-06-10 NOTE — ED Provider Notes (Signed)
[facility] EMERGENCY DEPARTMENT Provider Note   CSN: [id number] Arrival date & time: 06/10/18  1644     History   Chief Complaint Chief Complaint  Patient presents with  . Cough    HPI Adam Weaver is a 32 y.o. male.  The history is provided by the patient and medical records. No language interpreter was used.  Cough  Associated symptoms include chills, sore throat and myalgias. Pertinent negatives include no chest pain and no shortness of breath.   Adam Weaver is a 32 y.o. male who presents to the Emergency Department complaining of cough, congestion, myalgias and nausea for the last 6 days.  Over the last 3 days, he has felt feverish and had chills, but did not check temperature.  That has improved and he has not felt feverish for the last 2 to 3 days.  He has not taken any medications prior to arrival for his symptoms.  He reports that he does not like taking medication.  No abdominal pain or vomiting.  No chest pain or trouble breathing.   Past Medical History:  Diagnosis Date  . Hypokalemia   . Kidney stones   . Renal disorder     Patient Active Problem List   Diagnosis Date Noted  . Bronchitis   . 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 Hypokalemia 12/05/2017  . ARF (acute renal failure) (HCC) 12/05/2017  . Leucocytosis 12/05/2017  . Dehydration 12/05/2017    Past Surgical History:  Procedure Laterality Date  . LITHOTRIPSY          Home Medications    Prior to Admission medications   Medication Sig Start Date End Date Taking? Authorizing Provider  nicotine (NICODERM CQ - DOSED IN MG/24 HOURS) 21 mg/24hr patch Place 1 patch (21 mg total) onto the skin daily. 03/18/18   Alwyn Ren, MD  ondansetron (ZOFRAN ODT) 4 MG disintegrating tablet Take 1 tablet (4 mg total) by mouth every 8 (eight) hours as needed for nausea or vomiting. 06/10/18   Ward, Chase Picket, PA-C    Potassium 99 MG TABS Take 9 tablets by mouth 2 (two) times daily.     [provider]  potassium chloride (K-DUR) 10 MEQ tablet Take 4 tablets (40 mEq total) by mouth daily. 03/17/18   Alwyn Ren, MD    Family History No family history on file.  Social History Social History   Tobacco Use  . Smoking status: Current Every Day Smoker  . Smokeless tobacco: Never Used  Substance Use Topics  . Alcohol use: Never    Frequency: Never  . Drug use: Never     Allergies   Zithromax [azithromycin] and Ciprofloxacin   Review of Systems Review of Systems  Constitutional: Positive for chills and fever (Subjective).  HENT: Positive for congestion and sore throat.   Respiratory: Positive for cough. Negative for shortness of breath.   Cardiovascular: Negative for chest pain.  Gastrointestinal: Positive for nausea. Negative for abdominal pain, constipation, diarrhea and vomiting.  Genitourinary: Negative for difficulty urinating and dysuria.  Musculoskeletal: Positive for myalgias.  All other systems reviewed and are negative.    Physical Exam Updated Vital Signs BP (!) 151/98 (BP Location: Left Arm)   Pulse (!) 103   Temp 98.3 F (36.8 C) (Oral)   Resp 20   Ht 5\' 11"  (1.803 m)   Wt 118.4 kg   SpO2 98%   BMI 36.40 kg/m   Physical Exam  Constitutional: He is oriented to person, place, and time. He appears well-developed and well-nourished. No distress.  HENT:  Head: Normocephalic and atraumatic.  OP with erythema and PND, no exudates or tonsillar hypertrophy.  Cardiovascular: Normal rate, regular rhythm and normal heart sounds.  No murmur heard. RRR on exam.  Pulmonary/Chest: Effort normal and breath sounds normal. No respiratory distress.  Lungs CTA bilaterally.  Abdominal: Soft. He exhibits no distension. There is no tenderness.  Musculoskeletal: He exhibits no edema.  Neurological: He is alert and oriented to person, place, and time.  Skin: Skin is  warm and dry.  Nursing note and vitals reviewed.    ED Treatments / Results  Labs (all labs ordered are listed, but only abnormal results are displayed) Labs Reviewed - No data to display  EKG None  Radiology No results found.  Procedures Procedures (including critical care time)  Medications Ordered in ED Medications - No data to display   Initial Impression / Assessment and Plan / ED Course  I have reviewed the triage vital signs and the nursing notes.  Pertinent labs & imaging results that were available during my care of the patient were reviewed by me and considered in my medical decision making (see chart for details).    Adam Weaver is a 32 y.o. male who presents to ED for cough, congestion, myalgias and nausea. Feels similar to when he had the flu a few years ago. Initially was having subjective fever and chills which has resolved.  Afebrile and hemodynamically stable today with benign abdominal exam and clear lung sounds.  History suggestive of viral illness which he appears to be at the end of as his fever has subsided and him improvement in symptoms.  Evaluation does not show pathology that would require ongoing emergent intervention or inpatient treatment.  Symptomatic home care instructions were discussed. PCP follow-up encouraged.  Reasons to return to ER discussed and all questions answered.   Final Clinical Impressions(s) / ED Diagnoses   Final diagnoses:  Viral illness    ED Discharge Orders         Ordered    ondansetron (ZOFRAN ODT) 4 MG disintegrating tablet  Every 8 hours PRN     06/10/18 1800           Ward, Chase PicketJaime Pilcher, PA-C 06/10/18 2010    Sabas SousBero, Michael M, MD 06/11/18 0020

## 2018-06-10 NOTE — ED Triage Notes (Signed)
C/o flu like sx x 6 days-NAD-steady gait 

## 2018-06-10 NOTE — Discharge Instructions (Addendum)
It was my pleasure taking care of you today!   Zofran as needed for nausea.   Follow up with your doctor if your symptoms are not improving in the next 3-4 days.   Return to ER for new or worsening symptoms, any additional concerns.

## 2018-08-03 ENCOUNTER — Inpatient Hospital Stay (HOSPITAL_BASED_OUTPATIENT_CLINIC_OR_DEPARTMENT_OTHER)
Admission: EM | Admit: 2018-08-03 | Discharge: 2018-08-05 | DRG: 641 | Disposition: A | Discharge status: Home / Self Care | Payer: Self-pay | Attending: Internal Medicine | Admitting: Internal Medicine

## 2018-08-03 ENCOUNTER — Observation Stay (HOSPITAL_COMMUNITY): Payer: Self-pay

## 2018-08-03 ENCOUNTER — Encounter (HOSPITAL_BASED_OUTPATIENT_CLINIC_OR_DEPARTMENT_OTHER): Payer: Self-pay | Admitting: Emergency Medicine

## 2018-08-03 ENCOUNTER — Other Ambulatory Visit: Payer: Self-pay

## 2018-08-03 DIAGNOSIS — F17210 Nicotine dependence, cigarettes, uncomplicated: Secondary | ICD-10-CM | POA: Diagnosis present

## 2018-08-03 DIAGNOSIS — Z87442 Personal history of urinary calculi: Secondary | ICD-10-CM

## 2018-08-03 DIAGNOSIS — J189 Pneumonia, unspecified organism: Secondary | ICD-10-CM

## 2018-08-03 DIAGNOSIS — E876 Hypokalemia: Principal | ICD-10-CM | POA: Diagnosis present

## 2018-08-03 DIAGNOSIS — Z881 Allergy status to other antibiotic agents status: Secondary | ICD-10-CM

## 2018-08-03 DIAGNOSIS — G723 Periodic paralysis: Secondary | ICD-10-CM | POA: Diagnosis present

## 2018-08-03 DIAGNOSIS — Z79899 Other long term (current) drug therapy: Secondary | ICD-10-CM

## 2018-08-03 DIAGNOSIS — N183 Chronic kidney disease, stage 3 (moderate): Secondary | ICD-10-CM | POA: Diagnosis present

## 2018-08-03 DIAGNOSIS — N179 Acute kidney failure, unspecified: Secondary | ICD-10-CM | POA: Diagnosis present

## 2018-08-03 HISTORY — DX: Periodic paralysis: G72.3

## 2018-08-03 LAB — CBC WITH DIFFERENTIAL/PLATELET
Abs Immature Granulocytes: 0.07 10*3/uL (ref 0.00–0.07)
BASOS PCT: 1 %
Basophils Absolute: 0.1 10*3/uL (ref 0.0–0.1)
EOS ABS: 0.5 10*3/uL (ref 0.0–0.5)
EOS PCT: 3 %
HCT: 50 % (ref 39.0–52.0)
Hemoglobin: 17.2 g/dL — ABNORMAL HIGH (ref 13.0–17.0)
Immature Granulocytes: 0 %
Lymphocytes Relative: 23 %
Lymphs Abs: 3.6 10*3/uL (ref 0.7–4.0)
MCH: 29.5 pg (ref 26.0–34.0)
MCHC: 34.4 g/dL (ref 30.0–36.0)
MCV: 85.6 fL (ref 80.0–100.0)
MONO ABS: 1.1 10*3/uL — AB (ref 0.1–1.0)
MONOS PCT: 7 %
Neutro Abs: 10.5 10*3/uL — ABNORMAL HIGH (ref 1.7–7.7)
Neutrophils Relative %: 66 %
PLATELETS: 328 10*3/uL (ref 150–400)
RBC: 5.84 MIL/uL — ABNORMAL HIGH (ref 4.22–5.81)
RDW: 14.6 % (ref 11.5–15.5)
WBC: 15.8 10*3/uL — AB (ref 4.0–10.5)
nRBC: 0 % (ref 0.0–0.2)

## 2018-08-03 LAB — CBC
HCT: 44.1 % (ref 39.0–52.0)
Hemoglobin: 15.6 g/dL (ref 13.0–17.0)
MCH: 30.4 pg (ref 26.0–34.0)
MCHC: 35.4 g/dL (ref 30.0–36.0)
MCV: 86 fL (ref 80.0–100.0)
Platelets: 276 10*3/uL (ref 150–400)
RBC: 5.13 MIL/uL (ref 4.22–5.81)
RDW: 14.7 % (ref 11.5–15.5)
WBC: 10.6 10*3/uL — ABNORMAL HIGH (ref 4.0–10.5)
nRBC: 0 % (ref 0.0–0.2)

## 2018-08-03 LAB — URINALYSIS, ROUTINE W REFLEX MICROSCOPIC
Bilirubin Urine: NEGATIVE
Glucose, UA: NEGATIVE mg/dL
Ketones, ur: NEGATIVE mg/dL
Nitrite: NEGATIVE
Protein, ur: NEGATIVE mg/dL
Specific Gravity, Urine: 1.005 (ref 1.005–1.030)
pH: 7 (ref 5.0–8.0)

## 2018-08-03 LAB — BASIC METABOLIC PANEL
Anion gap: 6 (ref 5–15)
BUN: 23 mg/dL — AB (ref 6–20)
CALCIUM: 8.9 mg/dL (ref 8.9–10.3)
CO2: 15 mmol/L — ABNORMAL LOW (ref 22–32)
Chloride: 116 mmol/L — ABNORMAL HIGH (ref 98–111)
Creatinine, Ser: 1.88 mg/dL — ABNORMAL HIGH (ref 0.61–1.24)
GFR calc Af Amer: 54 mL/min — ABNORMAL LOW (ref >60)
GFR, EST NON AFRICAN AMERICAN: 47 mL/min — AB (ref >60)
GLUCOSE: 103 mg/dL — AB (ref 70–99)
Sodium: 137 mmol/L (ref 135–145)

## 2018-08-03 LAB — RENAL FUNCTION PANEL
Albumin: 3.4 g/dL — ABNORMAL LOW (ref 3.5–5.0)
Albumin: 3.2 g/dL — ABNORMAL LOW (ref 3.5–5.0)
Albumin: 3.4 g/dL — ABNORMAL LOW (ref 3.5–5.0)
Anion gap: 8 (ref 5–15)
Anion gap: 7 (ref 5–15)
Anion gap: 8 (ref 5–15)
BUN: 22 mg/dL — ABNORMAL HIGH (ref 6–20)
BUN: 18 mg/dL (ref 6–20)
BUN: 18 mg/dL (ref 6–20)
CO2: 15 mmol/L — ABNORMAL LOW (ref 22–32)
CO2: 15 mmol/L — ABNORMAL LOW (ref 22–32)
CO2: 17 mmol/L — ABNORMAL LOW (ref 22–32)
Calcium: 8 mg/dL — ABNORMAL LOW (ref 8.9–10.3)
Calcium: 8.1 mg/dL — ABNORMAL LOW (ref 8.9–10.3)
Calcium: 8 mg/dL — ABNORMAL LOW (ref 8.9–10.3)
Chloride: 118 mmol/L — ABNORMAL HIGH (ref 98–111)
Chloride: 120 mmol/L — ABNORMAL HIGH (ref 98–111)
Chloride: 117 mmol/L — ABNORMAL HIGH (ref 98–111)
Creatinine, Ser: 1.93 mg/dL — ABNORMAL HIGH (ref 0.61–1.24)
Creatinine, Ser: 1.86 mg/dL — ABNORMAL HIGH (ref 0.61–1.24)
Creatinine, Ser: 2 mg/dL — ABNORMAL HIGH (ref 0.61–1.24)
GFR calc Af Amer: 52 mL/min — ABNORMAL LOW (ref >60)
GFR calc Af Amer: 55 mL/min — ABNORMAL LOW (ref >60)
GFR calc Af Amer: 50 mL/min — ABNORMAL LOW (ref >60)
GFR calc non Af Amer: 45 mL/min — ABNORMAL LOW (ref >60)
GFR calc non Af Amer: 47 mL/min — ABNORMAL LOW (ref >60)
GFR calc non Af Amer: 43 mL/min — ABNORMAL LOW (ref >60)
Glucose, Bld: 112 mg/dL — ABNORMAL HIGH (ref 70–99)
Glucose, Bld: 102 mg/dL — ABNORMAL HIGH (ref 70–99)
Glucose, Bld: 125 mg/dL — ABNORMAL HIGH (ref 70–99)
Phosphorus: 1.5 mg/dL — ABNORMAL LOW (ref 2.5–4.6)
Phosphorus: 1.2 mg/dL — ABNORMAL LOW (ref 2.5–4.6)
Phosphorus: 1.6 mg/dL — ABNORMAL LOW (ref 2.5–4.6)
Potassium: 2 mmol/L — CL (ref 3.5–5.1)
Potassium: 2.2 mmol/L — CL (ref 3.5–5.1)
Potassium: 2.6 mmol/L — CL (ref 3.5–5.1)
Sodium: 141 mmol/L (ref 135–145)
Sodium: 142 mmol/L (ref 135–145)
Sodium: 142 mmol/L (ref 135–145)

## 2018-08-03 LAB — TSH: TSH: 4.015 u[IU]/mL (ref 0.350–4.500)

## 2018-08-03 LAB — T4, FREE: Free T4: 0.92 ng/dL (ref 0.82–1.77)

## 2018-08-03 MED ORDER — POTASSIUM PHOSPHATES 15 MMOLE/5ML IV SOLN
30.0000 mmol | Freq: Once | INTRAVENOUS | Status: AC
  Administered 2018-08-03: 30 mmol via INTRAVENOUS
  Filled 2018-08-03: qty 10

## 2018-08-03 MED ORDER — POTASSIUM CHLORIDE CRYS ER 10 MEQ PO TBCR: 10.0000 meq | EXTENDED_RELEASE_TABLET | ORAL | Status: DC

## 2018-08-03 MED ORDER — POTASSIUM CHLORIDE 10 MEQ/100ML IV SOLN
10.0000 meq | INTRAVENOUS | Status: AC
  Administered 2018-08-03 (×6): 10 meq via INTRAVENOUS
  Filled 2018-08-03 (×7): qty 100

## 2018-08-03 MED ORDER — KCL IN DEXTROSE-NACL 20-5-0.45 MEQ/L-%-% IV SOLN
INTRAVENOUS | Status: DC
  Administered 2018-08-03: 20:00:00 via INTRAVENOUS
  Filled 2018-08-03 (×3): qty 1000

## 2018-08-03 MED ORDER — POTASSIUM CHLORIDE 10 MEQ/100ML IV SOLN
10.0000 meq | INTRAVENOUS | Status: AC
  Administered 2018-08-03 (×2): 10 meq via INTRAVENOUS
  Filled 2018-08-03 (×3): qty 100

## 2018-08-03 MED ORDER — NICOTINE 21 MG/24HR TD PT24
21.0000 mg | MEDICATED_PATCH | Freq: Every day | TRANSDERMAL | Status: DC
  Administered 2018-08-03 – 2018-08-04 (×2): 21 mg via TRANSDERMAL
  Filled 2018-08-03 (×2): qty 1

## 2018-08-03 MED ORDER — POTASSIUM CHLORIDE 2 MEQ/ML IV SOLN
INTRAVENOUS | Status: DC
  Administered 2018-08-03: 12:00:00 via INTRAVENOUS
  Filled 2018-08-03 (×2): qty 1000

## 2018-08-03 MED ORDER — POTASSIUM CHLORIDE 10 MEQ/100ML IV SOLN
10.0000 meq | INTRAVENOUS | Status: AC
  Administered 2018-08-03 (×4): 10 meq via INTRAVENOUS
  Filled 2018-08-03 (×4): qty 100

## 2018-08-03 MED ORDER — ENOXAPARIN SODIUM 40 MG/0.4ML ~~LOC~~ SOLN
40.0000 mg | SUBCUTANEOUS | Status: DC
  Administered 2018-08-03 – 2018-08-04 (×2): 40 mg via SUBCUTANEOUS
  Filled 2018-08-03 (×2): qty 0.4

## 2018-08-03 MED ORDER — POTASSIUM CHLORIDE CRYS ER 20 MEQ PO TBCR
40.0000 meq | EXTENDED_RELEASE_TABLET | Freq: Once | ORAL | Status: AC
  Administered 2018-08-03: 40 meq via ORAL
  Filled 2018-08-03: qty 2

## 2018-08-03 NOTE — ED Triage Notes (Signed)
Patient states that he has had generalized weakness for the last 24 hours - the patient state that he has been unable to walk for the last few hours

## 2018-08-03 NOTE — Progress Notes (Signed)
Upon report patient run of potassium running at 75 ml/hr and normal saline at 125 (y in to each other).   Upon assessment patient states that "I have been doing this long enough to know what works for me". Told Nurse that the potassium burns too much at any other level.   Dr. Dartha Lodge changed normal saline at 125 order to 0.45 normal saline in 5% dextrose 20 Potassium at a rate of 75.  When that fluid was added patient complained of increased burning and asked if potassium rate could be dropped to 65 ml/hr.   Once the runs of potassium are over the carrier fluid rate will be decreased from 125-75.

## 2018-08-03 NOTE — Progress Notes (Signed)
CRITICAL VALUE ALERT  Critical Value:  2.0  Date & Time Notied:  08/03/2018 @0900   Provider Notified: Dr. Dartha Lodgegbata  Orders Received/Actions taken: Renal Renal Panel Q6

## 2018-08-03 NOTE — H&P (Signed)
History and Physical  Merton BorderJoshua Edgecombe ZOX:[id number] DOB: 07/14/1985 DOA: 08/03/2018  Referring physician: [facility] PCP: Patient, No Pcp Per  Outpatient Specialists:    Patient coming from: Home  Chief Complaint: Inability to move or use muscles  HPI: Patient is a 33 year old Caucasian male, obese, with past medical history significant for hypokalemic periodic paralysis, chronic kidney disease stage III and nephrolithiasis.  According to the patient, he has been having periodic paralysis since the age of 89.  Patient denies any other known family member with similar problem.  Current problem started 2 nights ago, after patient woke up in the middle of the night and found out that he was unable to move.  Patient took some potassium tablets without any significant improvement.  Patient denied any strenuous physical activity or any excessive carbohydrate diet.  [facility], and was [facility] to [facility] for further assessment and management.  On presentation to the hospital, patient's potassium was less than 2.  Patient is currently on potassium replacement.  No headache, no neck pain, no chest pain, no shortness of breath, no fever or chills, no GI symptoms and no urinary symptoms.  Patient be admitted for further assessment and management.  ED Course: Patient's potassium is currently being replaced. Pertinent labs: BMP on presentation revealed sodium of 137, potassium of less than 2, chloride 116, CO2 15, glucose of 103, BUN of 23, serum creatinine of 1.88 blood sugar of 103.  CBC reveals WBC of 15.8, hemoglobin of 17.3, hematocrit of 50, MCV of 85.6 with platelet count of 328.  Backslash 1.5. EKG: Independently reviewed.  QTc interval is 455 ms. Imaging: independently reviewed.   Review of Systems:  Negative for fever, visual changes, sore throat, rash, chest pain, SOB, dysuria, bleeding, n/v/abdominal pain.  Past Medical  History:  Diagnosis Date  . Hypokalemia   . Hypokalemic periodic paralysis   . Kidney stones   . Renal disorder     Past Surgical History:  Procedure Laterality Date  . LITHOTRIPSY       reports that he has been smoking. He has never used smokeless tobacco. He reports that he does not drink alcohol or use drugs.  Allergies  Allergen Reactions  . Zithromax [Azithromycin] Anaphylaxis  . Ciprofloxacin Hives    History reviewed. No pertinent family history.   Prior to Admission medications   Medication Sig Start Date End Date Taking? Authorizing Provider  nicotine (NICODERM CQ - DOSED IN MG/24 HOURS) 21 mg/24hr patch Place 1 patch (21 mg total) onto the skin daily. 03/18/18   Alwyn RenMathews, Elizabeth G, MD  ondansetron (ZOFRAN ODT) 4 MG disintegrating tablet Take 1 tablet (4 mg total) by mouth every 8 (eight) hours as needed for nausea or vomiting. 06/10/18   Ward, Chase PicketJaime Pilcher, PA-C  Potassium 99 MG TABS Take 9 tablets by mouth 2 (two) times daily.     [provider]  potassium chloride (K-DUR) 10 MEQ tablet Take 4 tablets (40 mEq total) by mouth daily. 03/17/18   Alwyn RenMathews, Elizabeth G, MD    Physical Exam: Vitals:   08/03/18 0530 08/03/18 0600 08/03/18 0700 08/03/18 1000  BP:  125/77 134/83   Pulse: 84 82 60   Resp: 20 20 12    Temp:   97.6 F (36.4 C)   TempSrc:   Oral   SpO2: 96% 97% 95%   Weight:   121.2 kg 123.8 kg  Height:   5\' 11"  (1.803  m) 6' (1.829 m)     Constitutional:  . Appears calm and comfortable.  Obese Eyes:  . No pallor. No jaundice.  ENMT:  . external ears, nose appear normal Neck:  . Neck is supple. No JVD Respiratory:  . CTA bilaterally, no w/r/r.  . Respiratory effort normal. No retractions or accessory muscle use Cardiovascular:  . S1S2 . No LE extremity edema   Abdomen:  . Abdomen is obese, soft and non tender. Organs are difficult to assess. Neurologic:  . Awake and alert. . Some movement of the extremities   Wt Readings from  Last 3 Encounters:  08/03/18 123.8 kg  06/10/18 118.4 kg  03/16/18 113.4 kg    I have personally reviewed following labs and imaging studies  Labs on Admission:  CBC: Recent Labs  Lab 08/03/18 0108  WBC 15.8*  NEUTROABS 10.5*  HGB 17.2*  HCT 50.0  MCV 85.6  PLT 328   Basic Metabolic Panel: Recent Labs  Lab 08/03/18 0108 08/03/18 0826  NA 137 141  K <2.0* 2.0*  CL 116* 118*  CO2 15* 15*  GLUCOSE 103* 112*  BUN 23* 22*  CREATININE 1.88* 1.93*  CALCIUM 8.9 8.0*  PHOS  --  1.5*   Liver Function Tests: Recent Labs  Lab 08/03/18 0826  ALBUMIN 3.4*   No results for input(s): LIPASE, AMYLASE in the last 168 hours. No results for input(s): AMMONIA in the last 168 hours. Coagulation Profile: No results for input(s): INR, PROTIME in the last 168 hours. Cardiac Enzymes: No results for input(s): CKTOTAL, CKMB, CKMBINDEX, TROPONINI in the last 168 hours. BNP (last 3 results) No results for input(s): PROBNP in the last 8760 hours. HbA1C: No results for input(s): HGBA1C in the last 72 hours. CBG: No results for input(s): GLUCAP in the last 168 hours. Lipid Profile: No results for input(s): CHOL, HDL, LDLCALC, TRIG, CHOLHDL, LDLDIRECT in the last 72 hours. Thyroid Function Tests: No results for input(s): TSH, T4TOTAL, FREET4, T3FREE, THYROIDAB in the last 72 hours. Anemia Panel: No results for input(s): VITAMINB12, FOLATE, FERRITIN, TIBC, IRON, RETICCTPCT in the last 72 hours. Urine analysis:    Component Value Date/Time   COLORURINE YELLOW 12/24/2017 2312   APPEARANCEUR CLEAR 12/24/2017 2312   LABSPEC 1.010 12/24/2017 2312   PHURINE 6.5 12/24/2017 2312   GLUCOSEU NEGATIVE 12/24/2017 2312   HGBUR LARGE (A) 12/24/2017 2312   BILIRUBINUR NEGATIVE 12/24/2017 2312   KETONESUR NEGATIVE 12/24/2017 2312   PROTEINUR NEGATIVE 12/24/2017 2312   NITRITE NEGATIVE 12/24/2017 2312   LEUKOCYTESUR MODERATE (A) 12/24/2017 2312   Sepsis  Labs: @LABRCNTIP (procalcitonin:4,lacticidven:4) )No results found for this or any previous visit (from the past 240 hour(s)).    Radiological Exams on Admission: No results found.  EKG: Independently reviewed.   Active Problems:   Hypokalemic periodic paralysis   Familial hypokalemic periodic paralysis   Assessment/Plan: Hypokalemic periodic paralysis: Continue potassium replacement 10 M EQ every hour. Monitor BMP every 6 hours. Watch for elevated potassium. Check TSH and free T4. Further management will depend on hospital course. Avoid carbohydrate load.  CKD 3: Stable, and at baseline. Continue to monitor.  Hypophosphatemia: Phosphorus is 1.5. Replete (IV potassium phosphate 30 mmol x 1 dose) Continue to monitor level.  Further management will depend on hospital course.  DVT prophylaxis: Subcu Lovenox Code Status: Full code Family Communication: Fianc  Disposition Plan: Home eventually Consults called: None Admission status: Observation  Time spent: 65 minutes minutes  Berton Mount, MD  [facility] Pager #:  [phone] 7PM-7AM contact night coverage as above  08/03/2018, 10:59 AM

## 2018-08-03 NOTE — Progress Notes (Signed)
Writer arrived to place additional IV per IVT consult. On arrival, patient stated existing PIV is no linger burning and currently declining additional PIV. Above communicated to primary care RN. Instructed to consult IVT if PIV status changes.

## 2018-08-03 NOTE — ED Provider Notes (Signed)
[facility] Provider Note: Adam Dell, MD, FACEP  CSN: [id number] MRN: [id number] ARRIVAL: 08/03/18 at 0053 ROOM: MH02/MH02   CHIEF COMPLAINT  Weakness   HISTORY OF PRESENT ILLNESS  08/03/18 1:02 AM Adam Weaver is a 33 y.o. male with history of hypokalemic periodic paralysis.  He is here with generalized weakness that came on about 1 AM yesterday morning.  It is severe enough that he is unable to stand.  The weakness is generalized but worse in the lower extremities than the upper extremities.  He is also weak in the lower back.  When he attempts to support himself of this is painful in his joints.  He has been taking over-the-counter potassium supplementation without relief.   Past Medical History:  Diagnosis Date  . Hypokalemia   . Hypokalemic periodic paralysis   . Kidney stones   . Renal disorder     Past Surgical History:  Procedure Laterality Date  . LITHOTRIPSY      History reviewed. No pertinent family history.  Social History   Tobacco Use  . Smoking status: Current Every Day Smoker  . Smokeless tobacco: Never Used  Substance Use Topics  . Alcohol use: Never    Frequency: Never  . Drug use: Never    Prior to Admission medications   Medication Sig Start Date End Date Taking? Authorizing Provider  nicotine (NICODERM CQ - DOSED IN MG/24 HOURS) 21 mg/24hr patch Place 1 patch (21 mg total) onto the skin daily. 03/18/18   Adam Ren, MD  ondansetron (ZOFRAN ODT) 4 MG disintegrating tablet Take 1 tablet (4 mg total) by mouth every 8 (eight) hours as needed for nausea or vomiting. 06/10/18   Weaver, Adam Picket, PA-C  Potassium 99 MG TABS Take 9 tablets by mouth 2 (two) times daily.     [provider]  potassium chloride (K-DUR) 10 MEQ tablet Take 4 tablets (40 mEq total) by mouth daily. 03/17/18   Adam Ren, MD    Allergies Zithromax [azithromycin] and Ciprofloxacin   REVIEW OF SYSTEMS  Negative except as noted  here or in the History of Present Illness.   PHYSICAL EXAMINATION  Initial Vital Signs Blood pressure (!) 127/97, pulse 100, temperature (!) 97.5 F (36.4 C), temperature source Oral, resp. rate (!) 21, height 5' 11.75" (1.822 m), weight 118.4 kg, SpO2 100 %.  Examination General: Well-developed, well-nourished male in no acute distress; appearance consistent with age of record HENT: normocephalic; atraumatic Eyes: pupils equal, round and reactive to light; extraocular muscles intact Neck: supple Heart: regular rate and rhythm Lungs: clear to auscultation bilaterally Abdomen: soft; nondistended; nontender; bowel sounds present Extremities: No deformity; full range of motion; pulses normal Neurologic: Awake, alert and oriented; generalized weakness; no facial droop Skin: Warm and dry Psychiatric: Normal mood and affect   RESULTS  Summary of this visit's results, reviewed by myself:   EKG Interpretation  Date/Time:  Monday August 03 2018 00:59:30 EST Ventricular Rate:  96 PR Interval:    QRS Duration: 109 QT Interval:  360 QTC Calculation: 455 R Axis:   -65 Text Interpretation:  Sinus rhythm Inferior infarct, old Consider anterior infarct No significant change was found Confirmed by Adam Weaver ([id number]) on 08/03/2018 1:02:14 AM      Laboratory Studies: Results for orders placed or performed during the hospital encounter of 08/03/18 (from the past 24 hour(s))  CBC with Differential/Platelet     Status: Abnormal   Collection Time: 08/03/18  1:08 AM  Result Value Ref Range   WBC 15.8 (H) 4.0 - 10.5 K/uL   RBC 5.84 (H) 4.22 - 5.81 MIL/uL   Hemoglobin 17.2 (H) 13.0 - 17.0 g/dL   HCT 40.950.0 81.139.0 - 91.452.0 %   MCV 85.6 80.0 - 100.0 fL   MCH 29.5 26.0 - 34.0 pg   MCHC 34.4 30.0 - 36.0 g/dL   RDW 78.214.6 95.611.5 - 21.315.5 %   Platelets 328 150 - 400 K/uL   nRBC 0.0 0.0 - 0.2 %   Neutrophils Relative % 66 %   Neutro Abs 10.5 (H) 1.7 - 7.7 K/uL   Lymphocytes Relative 23 %   Lymphs Abs  3.6 0.7 - 4.0 K/uL   Monocytes Relative 7 %   Monocytes Absolute 1.1 (H) 0.1 - 1.0 K/uL   Eosinophils Relative 3 %   Eosinophils Absolute 0.5 0.0 - 0.5 K/uL   Basophils Relative 1 %   Basophils Absolute 0.1 0.0 - 0.1 K/uL   Immature Granulocytes 0 %   Abs Immature Granulocytes 0.07 0.00 - 0.07 K/uL  Basic metabolic panel     Status: Abnormal   Collection Time: 08/03/18  1:08 AM  Result Value Ref Range   Sodium 137 135 - 145 mmol/L   Potassium <2.0 (LL) 3.5 - 5.1 mmol/L   Chloride 116 (H) 98 - 111 mmol/L   CO2 15 (L) 22 - 32 mmol/L   Glucose, Bld 103 (H) 70 - 99 mg/dL   BUN 23 (H) 6 - 20 mg/dL   Creatinine, Ser 0.861.88 (H) 0.61 - 1.24 mg/dL   Calcium 8.9 8.9 - 57.810.3 mg/dL   GFR calc non Af Amer 47 (L) >60 mL/min   GFR calc Af Amer 54 (L) >60 mL/min   Anion gap 6 5 - 15   Imaging Studies: No results found.  ED COURSE and MDM  Nursing notes and initial vitals signs, including pulse oximetry, reviewed.  Vitals:   08/03/18 0058 08/03/18 0101 08/03/18 0115 08/03/18 0119  BP:  (!) 127/97    Pulse:  100 (!) 101 94  Resp:  (!) 21 15 14   Temp:  (!) 97.5 F (36.4 C)    TempSrc:  Oral    SpO2:  100% 96% 97%  Weight: 118.4 kg     Height: 5' 11.75" (1.822 m)      1:57 AM Potassium repletion initiated.  2:28 AM Dr. Antionette Weaver accepts for transfer to hospitalist service.  PROCEDURES    ED DIAGNOSES     ICD-10-CM   1. Hypokalemia E87.6   2. Potassium-sensitive periodic paralysis G72.3        Adam Weaver, Adam RuizJohn, MD 08/03/18 [id number]

## 2018-08-03 NOTE — ED Notes (Signed)
ED Provider at bedside. 

## 2018-08-03 NOTE — ED Notes (Signed)
Lab called and states K level is less than 2.0. Dr. Read Drivers and primary nurse aware.

## 2018-08-03 NOTE — ED Notes (Signed)
[facility] notified (Jamie) - patient ready for transport 

## 2018-08-03 NOTE — Progress Notes (Signed)
CRITICAL VALUE ALERT  Critical Value:  K 2.6  Date & Time Notied:  08/03/18 @ 1953   Provider Notified: Donnamarie Poag  Orders Received/Actions taken: Awaiting orders

## 2018-08-03 NOTE — Progress Notes (Signed)
CRITICAL VALUE ALERT  Critical Value:  Potassium 2.2  Date & Time Notied:  08/03/2018 @ 1254  Provider Notified: Dr. Dartha Lodge  Orders Received/Actions taken: Continue course of treatment

## 2018-08-04 LAB — RENAL FUNCTION PANEL
Albumin: 3.2 g/dL — ABNORMAL LOW (ref 3.5–5.0)
Albumin: 3.2 g/dL — ABNORMAL LOW (ref 3.5–5.0)
Albumin: 3.4 g/dL — ABNORMAL LOW (ref 3.5–5.0)
Anion gap: 6 (ref 5–15)
Anion gap: 8 (ref 5–15)
Anion gap: 8 (ref 5–15)
BUN: 12 mg/dL (ref 6–20)
BUN: 16 mg/dL (ref 6–20)
BUN: 17 mg/dL (ref 6–20)
CO2: 15 mmol/L — ABNORMAL LOW (ref 22–32)
CO2: 16 mmol/L — ABNORMAL LOW (ref 22–32)
CO2: 18 mmol/L — ABNORMAL LOW (ref 22–32)
Calcium: 7.9 mg/dL — ABNORMAL LOW (ref 8.9–10.3)
Calcium: 8 mg/dL — ABNORMAL LOW (ref 8.9–10.3)
Calcium: 8.2 mg/dL — ABNORMAL LOW (ref 8.9–10.3)
Chloride: 117 mmol/L — ABNORMAL HIGH (ref 98–111)
Chloride: 118 mmol/L — ABNORMAL HIGH (ref 98–111)
Chloride: 120 mmol/L — ABNORMAL HIGH (ref 98–111)
Creatinine, Ser: 1.71 mg/dL — ABNORMAL HIGH (ref 0.61–1.24)
Creatinine, Ser: 1.71 mg/dL — ABNORMAL HIGH (ref 0.61–1.24)
Creatinine, Ser: 1.89 mg/dL — ABNORMAL HIGH (ref 0.61–1.24)
GFR calc Af Amer: 54 mL/min — ABNORMAL LOW (ref >60)
GFR calc Af Amer: >60 mL/min (ref >60)
GFR calc Af Amer: >60 mL/min (ref >60)
GFR calc non Af Amer: 46 mL/min — ABNORMAL LOW (ref >60)
GFR calc non Af Amer: 52 mL/min — ABNORMAL LOW (ref >60)
GFR calc non Af Amer: 52 mL/min — ABNORMAL LOW (ref >60)
Glucose, Bld: 112 mg/dL — ABNORMAL HIGH (ref 70–99)
Glucose, Bld: 117 mg/dL — ABNORMAL HIGH (ref 70–99)
Glucose, Bld: 120 mg/dL — ABNORMAL HIGH (ref 70–99)
Phosphorus: 1.8 mg/dL — ABNORMAL LOW (ref 2.5–4.6)
Phosphorus: 1.9 mg/dL — ABNORMAL LOW (ref 2.5–4.6)
Phosphorus: 3.3 mg/dL (ref 2.5–4.6)
Potassium: 2.9 mmol/L — ABNORMAL LOW (ref 3.5–5.1)
Potassium: 3 mmol/L — ABNORMAL LOW (ref 3.5–5.1)
Potassium: 3.2 mmol/L — ABNORMAL LOW (ref 3.5–5.1)
Sodium: 141 mmol/L (ref 135–145)
Sodium: 142 mmol/L (ref 135–145)
Sodium: 143 mmol/L (ref 135–145)

## 2018-08-04 LAB — MAGNESIUM: Magnesium: 2.2 mg/dL (ref 1.7–2.4)

## 2018-08-04 MED ORDER — POTASSIUM CHLORIDE CRYS ER 20 MEQ PO TBCR
40.0000 meq | EXTENDED_RELEASE_TABLET | Freq: Once | ORAL | Status: AC
  Administered 2018-08-04: 40 meq via ORAL
  Filled 2018-08-04: qty 2

## 2018-08-04 MED ORDER — POTASSIUM CHLORIDE CRYS ER 20 MEQ PO TBCR
40.0000 meq | EXTENDED_RELEASE_TABLET | Freq: Two times a day (BID) | ORAL | Status: DC
  Administered 2018-08-04 – 2018-08-05 (×2): 40 meq via ORAL
  Filled 2018-08-04 (×2): qty 2

## 2018-08-04 MED ORDER — FLUTICASONE PROPIONATE 50 MCG/ACT NA SUSP
2.0000 | Freq: Every day | NASAL | Status: DC
  Administered 2018-08-04: 2 via NASAL
  Filled 2018-08-04 (×2): qty 16

## 2018-08-04 MED ORDER — ACETAMINOPHEN 325 MG PO TABS
650.0000 mg | ORAL_TABLET | Freq: Four times a day (QID) | ORAL | Status: DC | PRN
  Administered 2018-08-04: 650 mg via ORAL
  Filled 2018-08-04: qty 2

## 2018-08-04 NOTE — Progress Notes (Signed)
PROGRESS NOTE    Adam Weaver  ZOX:[id number] DOB: 07/19/1985 DOA: 08/03/2018 PCP: Patient, No Pcp Per      Brief Narrative:  Mr. Adam Weaver is a 33 y.o. M with hx hypokalemic periodic paralysis, CKD III baseline Cr 1.7, and nephrolithiasis who presents with paralysis.    Has been symptomatic since age 89, no other family members affected.  [facility] [place] [facility] [place] about [place], [place], not established with MD here yet.  2 nights ago patient woke with a paralysis flare.  Tried taking potassium supplements which he has for this, but had no imrpovement, so came to ER.       Assessment & Plan:  Hypokalemic flare of hypokalemic periodic paralysis Unclear trigger.  No strenuous exercise.  Patient states he avoids B12 supplements, coffee, alcohol (but does not avoid carbohydrates, and he had two sprites on his bedside table).   K up to 3 today.  Mag normal.  Got 70 mEq K since last K check. -Hold K for now -Trend K q6hrs to assess for response and monitor for rebound HyperK -Consult Nephrology, appreciate expert cares -Low carb diet   AKI on CKD III Baseline Cr 1.6 or so.  Creatinine 2 on admission, down to 1.8. -Stop IV fluids -Push oral fluids -Consult Nephrology -Request outside records      MDM and disposition: The below labs and imaging reports were reviewed and summarized above.  Medication management as above.  The patient was admitted with severe hypokalemia in setting of HPP.  This AM still severely hypokalemic after 24 hours observation.  Will continue supplements, frequent q6hrs K checks, consult Nephrology.    The risk of premature discharge at this time outweighs potential benefits.     DVT prophylaxis: Lovenox Code Status: FULL Family Communication: None presetn    Consultants:   Nephrology  Procedures:   None  Antimicrobials:   None    Subjective: Feeling tired, but hands no  longer cramping.  No confusion.  Able to stand, cannot walk far.  Objective: Vitals:   08/03/18 1930 08/04/18 0011 08/04/18 0407 08/04/18 0821  BP: 135/79 112/67 118/69 129/85  Pulse:  82 90 80  Resp: (!) 21 20 18 16   Temp: 97.8 F (36.6 C) 98.8 F (37.1 C) 97.9 F (36.6 C) (!) 97.5 F (36.4 C)  TempSrc: Oral Oral Oral Oral  SpO2: 98% 100% 98% 97%  Weight:      Height:        Intake/Output Summary (Last 24 hours) at 08/04/2018 0945 Last data filed at 08/04/2018 [id number] Gross per 24 hour  Intake 4211.59 ml  Output 600 ml  Net 3611.59 ml   Filed Weights   08/03/18 0058 08/03/18 0700 08/03/18 1000  Weight: 118.4 kg 121.2 kg 123.8 kg    Examination: General appearance:  adult male, alert and in no acute distress.   HEENT: Anicteric, conjunctiva pink, lids and lashes normal. No nasal deformity, discharge, epistaxis.  Lips moist.   Skin: Warm and dry.  no jaundice.  No suspicious rashes or lesions. Cardiac: Tachycardic, regular, nl S1-S2, no murmurs appreciated.  Capillary refill is brisk.  JVP not visible.  No LE edema.  Radia  pulses 2+ and symmetric. Respiratory: Normal respiratory rate and rhythm.  CTAB without rales or wheezes. Abdomen: Abdomen soft.  no TTP. No ascites, distension, hepatosplenomegaly.   MSK: No deformities or effusions. Neuro: Awake and alert.  EOMI, moves all extremities  with symmetric 4/5 strength. Speech fluent.    Psych: Sensorium intact and responding to questions, attention normal. Affect normal.  Judgment and insight appear normal.    Data Reviewed: I have personally reviewed following labs and imaging studies:  CBC: Recent Labs  Lab 08/03/18 0108 08/03/18 1108  WBC 15.8* 10.6*  NEUTROABS 10.5*  --   HGB 17.2* 15.6  HCT 50.0 44.1  MCV 85.6 86.0  PLT 328 276   Basic Metabolic Panel: Recent Labs  Lab 08/03/18 0826 08/03/18 1108 08/03/18 1830 08/03/18 2346 08/04/18 0553  NA 141 142 142 141 142  K 2.0* 2.2* 2.6* 2.9* 3.0*  CL 118* 120*  117* 118* 120*  CO2 15* 15* 17* 15* 16*  GLUCOSE 112* 102* 125* 117* 120*  BUN 22* 18 18 17 16   CREATININE 1.93* 1.86* 2.00* 1.89* 1.71*  CALCIUM 8.0* 8.1* 8.0* 8.0* 7.9*  MG  --   --   --  2.2  --   PHOS 1.5* 1.2* 1.6* 1.8* 3.3   GFR: Estimated Creatinine Clearance: 83.8 mL/min (A) (by C-G formula based on SCr of 1.71 mg/dL (H)). Liver Function Tests: Recent Labs  Lab 08/03/18 0826 08/03/18 1108 08/03/18 1830 08/03/18 2346 08/04/18 0553  ALBUMIN 3.4* 3.2* 3.4* 3.2* 3.2*   No results for input(s): LIPASE, AMYLASE in the last 168 hours. No results for input(s): AMMONIA in the last 168 hours. Coagulation Profile: No results for input(s): INR, PROTIME in the last 168 hours. Cardiac Enzymes: No results for input(s): CKTOTAL, CKMB, CKMBINDEX, TROPONINI in the last 168 hours. BNP (last 3 results) No results for input(s): PROBNP in the last 8760 hours. HbA1C: No results for input(s): HGBA1C in the last 72 hours. CBG: No results for input(s): GLUCAP in the last 168 hours. Lipid Profile: No results for input(s): CHOL, HDL, LDLCALC, TRIG, CHOLHDL, LDLDIRECT in the last 72 hours. Thyroid Function Tests: Recent Labs    08/03/18 1108  TSH 4.015  FREET4 0.92   Anemia Panel: No results for input(s): VITAMINB12, FOLATE, FERRITIN, TIBC, IRON, RETICCTPCT in the last 72 hours. Urine analysis:    Component Value Date/Time   COLORURINE STRAW (A) 08/03/2018 1833   APPEARANCEUR CLEAR 08/03/2018 1833   LABSPEC 1.005 08/03/2018 1833   PHURINE 7.0 08/03/2018 1833   GLUCOSEU NEGATIVE 08/03/2018 1833   HGBUR SMALL (A) 08/03/2018 1833   BILIRUBINUR NEGATIVE 08/03/2018 1833   KETONESUR NEGATIVE 08/03/2018 1833   PROTEINUR NEGATIVE 08/03/2018 1833   NITRITE NEGATIVE 08/03/2018 1833   LEUKOCYTESUR LARGE (A) 08/03/2018 1833   Sepsis Labs: @LABRCNTIP (procalcitonin:4,lacticacidven:4)  )No results found for this or any previous visit (from the past 240 hour(s)).       Radiology  Studies: Dg Chest 1 View  Result Date: 08/03/2018 CLINICAL DATA:  Pneumonia EXAM: CHEST  1 VIEW COMPARISON:  Portable exam 1131 hours compared to 03/16/2018 FINDINGS: Lordotic positioning. Normal heart size, mediastinal contours, and pulmonary vascularity. Lungs clear. No pulmonary infiltrate, pleural effusion or pneumothorax. Visualized osseous structures unremarkable. IMPRESSION: No acute abnormalities. Electronically Signed   By: Ulyses Southward M.D.   On: 08/03/2018 11:44        Scheduled Meds: . enoxaparin (LOVENOX) injection  40 mg Subcutaneous Q24H  . nicotine  21 mg Transdermal Daily   Continuous Infusions:   LOS: 0 days    Time spent: 25 minutes    Alberteen Sam, MD [facility] 08/04/2018, 9:45 AM     Please page through AMION:  [email or web address] Password [email or web address] If 7PM-7AM, please contact  night-coverage

## 2018-08-04 NOTE — Consult Note (Signed)
[facility]  HISTORY AND PHYSICAL  Adam BorderJoshua Weaver is an 33 y.o. male.    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 Maryfrances Bunnellanford for eval and recs re: hypokalemia.    Pt has had this condition for about 5 years and has seen a nephrologist in [place] where he is from.  He has moved to [place] but hasn't established with a new one.  He was admitted back in Sept for hypokalemia and was instructed to follow with our practice but didn't get appt.    He presents with inability to move despite multiple PO supps of K as OP.  Getting IV and PO K here.  Up to 3.2.  Feeling better.  Doesn't appear to be on K-sparing diuretics.  Passes kidney stones "all the time", unclear what type.  Cr 1.7 (baseline).   PMH: Past Medical History:  Diagnosis Date  . Hypokalemia   . Hypokalemic periodic paralysis   . Kidney stones   . Renal disorder    PSH: Past Surgical History:  Procedure Laterality Date  . LITHOTRIPSY       Past Medical History:  Diagnosis Date  . Hypokalemia   . Hypokalemic periodic paralysis   . Kidney stones   . Renal disorder     Medications:   Scheduled: . enoxaparin (LOVENOX) injection  40 mg Subcutaneous Q24H  . nicotine  21 mg Transdermal Daily    Medications Prior to Admission  Medication Sig Dispense Refill  . aspirin-acetaminophen-caffeine (EXCEDRIN MIGRAINE) 250-250-65 MG tablet Take 2 tablets by mouth every 6 (six) hours as needed for headache.    . Multiple Vitamin (MULTIVITAMIN WITH MINERALS) TABS tablet Take 1 tablet by mouth daily.    . nicotine (NICODERM CQ - DOSED IN MG/24 HOURS) 21 mg/24hr patch Place 1 patch (21 mg total) onto the skin daily. 28 patch 0  . Potassium 99 MG TABS Take 9 tablets by mouth 2 (two) times daily.     . ondansetron (ZOFRAN ODT) 4 MG disintegrating tablet Take 1 tablet (4 mg total) by mouth every 8 (eight) hours as needed for  nausea or vomiting. (Patient not taking: Reported on 08/03/2018) 20 tablet 0  . potassium chloride (K-DUR) 10 MEQ tablet Take 4 tablets (40 mEq total) by mouth daily. (Patient not taking: Reported on 08/03/2018) 40 tablet 0    ALLERGIES:   Allergies  Allergen Reactions  . Zithromax [Azithromycin] Anaphylaxis  . Ciprofloxacin Hives    FAM HX: History reviewed. No pertinent family history.  Social History:   reports that he has been smoking. He has never used smokeless tobacco. He reports that he does not drink alcohol or use drugs.  ROS: ROS: all other systems reviewed and negative except per HPI  Blood pressure 117/64, pulse 67, temperature 97.8 F (36.6 C), temperature source Oral, resp. rate 19, height 5\' 11"  (1.803 m), weight 123.8 kg, SpO2 96 %. PHYSICAL EXAM: Physical Exam  GEN NAD HEENT EOMI PERRL NECK no JVD PULM clear  CV RRR ABD soft nontender EXT no LE edema NEURO AAO x 3 SKIN no rashes   Results for orders placed or performed during the hospital encounter of 08/03/18 (from the past 48 hour(s))  CBC with Differential/Platelet     Status: Abnormal   Collection Time: 08/03/18  1:08 AM  Result Value Ref Range   WBC 15.8 (H) 4.0 - 10.5  K/uL   RBC 5.84 (H) 4.22 - 5.81 MIL/uL   Hemoglobin 17.2 (H) 13.0 - 17.0 g/dL   HCT 11.9 14.7 - 82.9 %   MCV 85.6 80.0 - 100.0 fL   MCH 29.5 26.0 - 34.0 pg   MCHC 34.4 30.0 - 36.0 g/dL   RDW 56.2 13.0 - 86.5 %   Platelets 328 150 - 400 K/uL   nRBC 0.0 0.0 - 0.2 %   Neutrophils Relative % 66 %   Neutro Abs 10.5 (H) 1.7 - 7.7 K/uL   Lymphocytes Relative 23 %   Lymphs Abs 3.6 0.7 - 4.0 K/uL   Monocytes Relative 7 %   Monocytes Absolute 1.1 (H) 0.1 - 1.0 K/uL   Eosinophils Relative 3 %   Eosinophils Absolute 0.5 0.0 - 0.5 K/uL   Basophils Relative 1 %   Basophils Absolute 0.1 0.0 - 0.1 K/uL   Immature Granulocytes 0 %   Abs Immature Granulocytes 0.07 0.00 - 0.07 K/uL    Comment: Performed at [facility], [address] [place], [place] [address]  Basic metabolic panel     Status: Abnormal   Collection Time: 08/03/18  1:08 AM  Result Value Ref Range   Sodium 137 135 - 145 mmol/L   Potassium <2.0 (LL) 3.5 - 5.1 mmol/L    Comment: CRITICAL RESULT CALLED TO, READ BACK BY AND VERIFIED WITH: MAYNARD,C AT 0155 ON [id number] BY CHERESNOWSKY,T    Chloride 116 (H) 98 - 111 mmol/L   CO2 15 (L) 22 - 32 mmol/L   Glucose, Bld 103 (H) 70 - 99 mg/dL   BUN 23 (H) 6 - 20 mg/dL   Creatinine, Ser 4.13 (H) 0.61 - 1.24 mg/dL   Calcium 8.9 8.9 - 24.4 mg/dL   GFR calc non Af Amer 47 (L) >60 mL/min   GFR calc Af Amer 54 (L) >60 mL/min   Anion gap 6 5 - 15    Comment: Performed at [facility], [address] [place], [place] [address]  Renal function panel     Status: Abnormal   Collection Time: 08/03/18  8:26 AM  Result Value Ref Range   Sodium 141 135 - 145 mmol/L   Potassium 2.0 (LL) 3.5 - 5.1 mmol/L    Comment: CRITICAL RESULT CALLED TO, READ BACK BY AND VERIFIED WITH: T HARRIS,RN AT 0900 08/03/2018 BY L BENFIELD    Chloride 118 (H) 98 - 111 mmol/L   CO2 15 (L) 22 - 32 mmol/L   Glucose, Bld 112 (H) 70 - 99 mg/dL   BUN 22 (H) 6 - 20 mg/dL   Creatinine, Ser 2.53 (H) 0.61 - 1.24 mg/dL   Calcium 8.0 (L) 8.9 - 10.3 mg/dL   Phosphorus 1.5 (L) 2.5 - 4.6 mg/dL   Albumin 3.4 (L) 3.5 - 5.0 g/dL   GFR calc non Af Amer 45 (L) >60 mL/min   GFR calc Af Amer 52 (L) >60 mL/min   Anion gap 8 5 - 15    Comment: Performed at [facility], [address], [place], [place] [address]  CBC     Status: Abnormal   Collection Time: 08/03/18 11:08 AM  Result Value Ref Range   WBC 10.6 (H) 4.0 - 10.5 K/uL   RBC 5.13 4.22 - 5.81 MIL/uL   Hemoglobin 15.6 13.0 - 17.0 g/dL   HCT 34.7 42.5 - 95.6 %   MCV 86.0 80.0 - 100.0 fL   MCH 30.4  26.0 - 34.0 pg   MCHC 35.4 30.0 - 36.0 g/dL   RDW 16.114.7 09.611.5 - 04.515.5 %   Platelets 276 150 - 400 K/uL   nRBC 0.0 0.0 - 0.2 %    Comment: Performed at [facility], [address] [place], [place] [address]  T4, free     Status: None   Collection Time: 08/03/18 11:08 AM  Result Value Ref Range   Free T4 0.92 0.82 - 1.77 ng/dL    Comment: (NOTE) Biotin ingestion may interfere with free T4 tests. If the results are inconsistent with the TSH level, previous test results, or the clinical presentation, then consider biotin interference. If needed, order repeat testing after stopping biotin. Performed at [facility], [address] [place], [place] [address]   Renal function panel     Status: Abnormal   Collection Time: 08/03/18 11:08 AM  Result Value Ref Range   Sodium 142 135 - 145 mmol/L   Potassium 2.2 (LL) 3.5 - 5.1 mmol/L    Comment: CRITICAL RESULT CALLED TO, READ BACK BY AND VERIFIED WITH: T HARRIS,RN AT 1254 08/03/2018 BY L BENFIELD    Chloride 120 (H) 98 - 111 mmol/L   CO2 15 (L) 22 - 32 mmol/L   Glucose, Bld 102 (H) 70 - 99 mg/dL   BUN 18 6 - 20 mg/dL   Creatinine, Ser 8.291.86 (H) 0.61 - 1.24 mg/dL   Calcium 8.1 (L) 8.9 - 10.3 mg/dL   Phosphorus 1.2 (L) 2.5 - 4.6 mg/dL   Albumin 3.2 (L) 3.5 - 5.0 g/dL   GFR calc non Af Amer 47 (L) >60 mL/min   GFR calc Af Amer 55 (L) >60 mL/min   Anion gap 7 5 - 15    Comment: Performed at [facility], [address] [place], [place] [address]  TSH     Status: None   Collection Time: 08/03/18 11:08 AM  Result Value Ref Range   TSH 4.015 0.350 - 4.500 uIU/mL    Comment: Performed by a 3rd Generation assay with a functional sensitivity of <=0.01 uIU/mL. Performed at [facility], [address] [place], [place] [address]   Renal function panel     Status: Abnormal   Collection Time: 08/03/18  6:30 PM  Result Value Ref Range   Sodium 142 135 - 145 mmol/L   Potassium 2.6 (LL) 3.5 - 5.1 mmol/L    Comment: CRITICAL RESULT CALLED TO, READ BACK BY AND VERIFIED WITH: J MILLER,RN 1952 08/03/2018 WBOND    Chloride 117 (H) 98 - 111 mmol/L   CO2 17 (L) 22 - 32 mmol/L   Glucose, Bld 125 (H) 70 - 99  mg/dL   BUN 18 6 - 20 mg/dL   Creatinine, Ser 8.462.00 (H) 0.61 - 1.24 mg/dL   Calcium 8.0 (L) 8.9 - 10.3 mg/dL   Phosphorus 1.6 (L) 2.5 - 4.6 mg/dL   Albumin 3.4 (L) 3.5 - 5.0 g/dL   GFR calc non Af Amer 43 (L) >60 mL/min   GFR calc Af Amer 50 (L) >60 mL/min   Anion gap 8 5 - 15    Comment: Performed at [facility], [address] [place], [place] [address]  Urinalysis, Routine w reflex microscopic     Status: Abnormal   Collection Time: 08/03/18  6:33 PM  Result Value Ref Range   Color, Urine STRAW (A) YELLOW   APPearance CLEAR CLEAR   Specific Gravity,  Urine 1.005 1.005 - 1.030   pH 7.0 5.0 - 8.0   Glucose, UA NEGATIVE NEGATIVE mg/dL   Hgb urine dipstick SMALL (A) NEGATIVE   Bilirubin Urine NEGATIVE NEGATIVE   Ketones, ur NEGATIVE NEGATIVE mg/dL   Protein, ur NEGATIVE NEGATIVE mg/dL   Nitrite NEGATIVE NEGATIVE   Leukocytes, UA LARGE (A) NEGATIVE   RBC / HPF 0-5 0 - 5 RBC/hpf   WBC, UA 6-10 0 - 5 WBC/hpf   Bacteria, UA RARE (A) NONE SEEN   Squamous Epithelial / LPF 0-5 0 - 5   Mucus PRESENT     Comment: Performed at [facility], [address] [place], [place] [address]  Renal function panel     Status: Abnormal   Collection Time: 08/03/18 11:46 PM  Result Value Ref Range   Sodium 141 135 - 145 mmol/L   Potassium 2.9 (L) 3.5 - 5.1 mmol/L   Chloride 118 (H) 98 - 111 mmol/L   CO2 15 (L) 22 - 32 mmol/L   Glucose, Bld 117 (H) 70 - 99 mg/dL   BUN 17 6 - 20 mg/dL   Creatinine, Ser 1.30 (H) 0.61 - 1.24 mg/dL   Calcium 8.0 (L) 8.9 - 10.3 mg/dL   Phosphorus 1.8 (L) 2.5 - 4.6 mg/dL   Albumin 3.2 (L) 3.5 - 5.0 g/dL   GFR calc non Af Amer 46 (L) >60 mL/min   GFR calc Af Amer 54 (L) >60 mL/min   Anion gap 8 5 - 15    Comment: Performed at [facility], [address] [place], [place] [address]  Magnesium     Status: None   Collection Time: 08/03/18 11:46 PM  Result Value Ref Range   Magnesium 2.2 1.7 - 2.4 mg/dL    Comment: Performed at [facility], [address] [place], [place] [address]  Renal function panel     Status: Abnormal   Collection Time: 08/04/18  5:53 AM  Result Value Ref Range   Sodium 142 135 - 145 mmol/L   Potassium 3.0 (L) 3.5 - 5.1 mmol/L   Chloride 120 (H) 98 - 111 mmol/L   CO2 16 (L) 22 - 32 mmol/L   Glucose, Bld 120 (H) 70 - 99 mg/dL   BUN 16 6 - 20 mg/dL   Creatinine, Ser 9.52 (H) 0.61 - 1.24 mg/dL   Calcium 7.9 (L) 8.9 - 10.3 mg/dL   Phosphorus 3.3 2.5 - 4.6 mg/dL   Albumin 3.2 (L) 3.5 - 5.0 g/dL   GFR calc non Af Amer 52 (L) >60 mL/min   GFR calc Af Amer >60 >60 mL/min   Anion gap 6 5 - 15    Comment: Performed at [facility], [address], [place], [place] [address]  Renal function panel     Status: Abnormal   Collection Time: 08/04/18 10:46 AM  Result Value Ref Range   Sodium 143 135 - 145 mmol/L   Potassium 3.2 (L) 3.5 - 5.1 mmol/L   Chloride 117 (H) 98 - 111 mmol/L   CO2 18 (L) 22 - 32 mmol/L   Glucose, Bld 112 (H) 70 - 99 mg/dL   BUN 12 6 - 20 mg/dL   Creatinine, Ser 4.40 (H) 0.61 - 1.24 mg/dL   Calcium 8.2 (L) 8.9 - 10.3 mg/dL   Phosphorus 1.9 (L) 2.5 - 4.6 mg/dL   Albumin 3.4 (L) 3.5 - 5.0 g/dL   GFR calc non Af Amer 52 (L) >  60 mL/min   GFR calc Af Amer >60 >60 mL/min   Anion gap 8 5 - 15    Comment: Performed at [facility], [address] [place], [place] [address]    Dg Chest 1 View  Result Date: 08/03/2018 CLINICAL DATA:  Pneumonia EXAM: CHEST  1 VIEW COMPARISON:  Portable exam 1131 hours compared to 03/16/2018 FINDINGS: Lordotic positioning. Normal heart size, mediastinal contours, and pulmonary vascularity. Lungs clear. No pulmonary infiltrate, pleural effusion or pneumothorax. Visualized osseous structures unremarkable. IMPRESSION: No acute abnormalities. Electronically Signed   By: Ulyses Southward M.D.   On: 08/03/2018 11:44    Assessment/Plan  1.  Hypokalemic periodic paralysis: on K supps, getting better.  Looks like he's nearing discharge.  Pt can't think of any  aggravating factors re: trigger for paralytic attack; would advise more attention to high carb diet if needed.  K supps at home.  He can come follow up in our clinic for consideration of K-sparing diuretic/ diamox/ eval of stones.  Will try to get records for OP followup.    Bufford Buttner 08/04/2018, 3:15 PM

## 2018-08-05 DIAGNOSIS — G723 Periodic paralysis: Secondary | ICD-10-CM

## 2018-08-05 LAB — BASIC METABOLIC PANEL
Anion gap: 8 (ref 5–15)
BUN: 15 mg/dL (ref 6–20)
CO2: 16 mmol/L — ABNORMAL LOW (ref 22–32)
Calcium: 8.6 mg/dL — ABNORMAL LOW (ref 8.9–10.3)
Chloride: 120 mmol/L — ABNORMAL HIGH (ref 98–111)
Creatinine, Ser: 1.89 mg/dL — ABNORMAL HIGH (ref 0.61–1.24)
GFR calc Af Amer: 54 mL/min — ABNORMAL LOW (ref >60)
GFR calc non Af Amer: 46 mL/min — ABNORMAL LOW (ref >60)
Glucose, Bld: 110 mg/dL — ABNORMAL HIGH (ref 70–99)
Potassium: 3.3 mmol/L — ABNORMAL LOW (ref 3.5–5.1)
SODIUM: 144 mmol/L (ref 135–145)

## 2018-08-05 NOTE — Progress Notes (Signed)
Pt has followup with me 3/4 12:45 PM.  Our schedulers have contacted pt.  Bufford Buttner MD [facility] pgr [phone]

## 2018-08-05 NOTE — Progress Notes (Signed)
Pt discharging to home, IV removed per protocol, paperwork provided and reviewed with patient and his fiance with  No questions or concerns at this time. Pt ambulated with nurse to hospital exit.

## 2018-08-05 NOTE — Discharge Summary (Signed)
Physician Discharge Summary  Merton BorderJoshua Mcduffey ZOX:[id number] DOB: 10/09/1985 DOA: 08/03/2018  PCP: Patient, No Pcp Per  Admit date: 08/03/2018 Discharge date: 08/05/2018  Admitted From: Inpatient Disposition: home  Recommendations for Outpatient Follow-up:  1. Follow up with PCP in 1-2 weeks 2. Please obtain BMP/CBC in one week 3. Please follow up on the following pending results:  Home Health:No Equipment/Devices:none  Discharge Condition:Good CODE STATUS:Full code Diet recommendation: Regular healthy diet  Brief/Interim Summary: [name] is a 33 y.o. M with hx hypokalemic periodic paralysis, CKD III baseline Cr 1.7, and nephrolithiasis who presented with paralysis.    Has been symptomatic since age 89, no other family members affected.  [facility] [place] [facility] [place] about [place], moved to [place], not established with MD here yet.  2 nights ago patient woke with a paralysis flare.  Tried taking potassium supplements which he has for this, but had no imrpovement, so came to ER.In the ER pt was noted to have hypokalemic periodic flare paralysis and was treated as follows:   Hypokalemic flare of hypokalemic periodic paralysis Unclear trigger.  No strenuous exercise.  Patient states he avoids B12 supplements, coffee, alcohol (but does not avoid carbohydrates, and he had two sprites on his bedside table).   K up to 3.3 today.  Mag normal.  Got 70 mEq K since last K check. -pt k was appropriately repleted and paralysis recolved -Consulted Nephrology, appreciate expert recs -Low carb diet on d/c which was discussed with pt as possible trugger   AKI on CKD III Baseline Cr 1.6 or so.  Creatinine 2 on admission, down to 1.8, encouraged to continue po intake on d/c -After IV fluid resus, pushed oral fluids      Discharge Diagnoses:  Active Problems:   Hypokalemic periodic paralysis   Familial hypokalemic periodic  paralysis    Discharge Instructions  Discharge Instructions    Diet - low sodium heart healthy   Complete by:  As directed    Increase activity slowly   Complete by:  As directed      Allergies as of 08/05/2018      Reactions   Zithromax [azithromycin] Anaphylaxis   Ciprofloxacin Hives      Medication List    TAKE these medications   aspirin-acetaminophen-caffeine 250-250-65 MG tablet Commonly known as:  EXCEDRIN MIGRAINE Take 2 tablets by mouth every 6 (six) hours as needed for headache.   multivitamin with minerals Tabs tablet Take 1 tablet by mouth daily.   nicotine 21 mg/24hr patch Commonly known as:  NICODERM CQ - dosed in mg/24 hours Place 1 patch (21 mg total) onto the skin daily.   ondansetron 4 MG disintegrating tablet Commonly known as:  ZOFRAN ODT Take 1 tablet (4 mg total) by mouth every 8 (eight) hours as needed for nausea or vomiting.   Potassium 99 MG Tabs Take 9 tablets by mouth 2 (two) times daily.   potassium chloride 10 MEQ tablet Commonly known as:  K-DUR Take 4 tablets (40 mEq total) by mouth daily.       Allergies  Allergen Reactions  . Zithromax [Azithromycin] Anaphylaxis  . Ciprofloxacin Hives    Consultations:  Nephrology   Procedures/Studies: Dg Chest 1 View  Result Date: 08/03/2018 CLINICAL DATA:  Pneumonia EXAM: CHEST  1 VIEW COMPARISON:  Portable exam 1131 hours compared to 03/16/2018 FINDINGS: Lordotic positioning. Normal heart size, mediastinal contours, and pulmonary vascularity. Lungs clear. No pulmonary infiltrate, pleural effusion  or pneumothorax. Visualized osseous structures unremarkable. IMPRESSION: No acute abnormalities. Electronically Signed   By: Ulyses Southward M.D.   On: 08/03/2018 11:44       Subjective:   Discharge Exam: Vitals:   08/04/18 2300 08/05/18 0300  BP: 122/86 110/79  Pulse: 92 82  Resp: (!) 22 19  Temp: 97.8 F (36.6 C) 97.9 F (36.6 C)  SpO2: 98% 99%   Vitals:   08/04/18 1606 08/04/18  2030 08/04/18 2300 08/05/18 0300  BP: 125/84  122/86 110/79  Pulse:   92 82  Resp: (!) 23 (!) 25 (!) 22 19  Temp: 98.5 F (36.9 C) 97.8 F (36.6 C) 97.8 F (36.6 C) 97.9 F (36.6 C)  TempSrc: Oral Oral Oral Oral  SpO2: 97% 98% 98% 99%  Weight:      Height:        General: Pt is alert, awake, not in acute distress Cardiovascular: RRR, S1/S2 +, no rubs, no gallops Respiratory: CTA bilaterally, no wheezing, no rhonchi Abdominal: Soft, NT, ND, bowel sounds + Extremities: no edema, no cyanosis    The results of significant diagnostics from this hospitalization (including imaging, microbiology, ancillary and laboratory) are listed below for reference.     Microbiology: No results found for this or any previous visit (from the past 240 hour(s)).   Labs: BNP (last 3 results) No results for input(s): BNP in the last 8760 hours. Basic Metabolic Panel: Recent Labs  Lab 08/03/18 1108 08/03/18 1830 08/03/18 2346 08/04/18 0553 08/04/18 1046 08/05/18 0257  NA 142 142 141 142 143 144  K 2.2* 2.6* 2.9* 3.0* 3.2* 3.3*  CL 120* 117* 118* 120* 117* 120*  CO2 15* 17* 15* 16* 18* 16*  GLUCOSE 102* 125* 117* 120* 112* 110*  BUN 18 18 17 16 12 15   CREATININE 1.86* 2.00* 1.89* 1.71* 1.71* 1.89*  CALCIUM 8.1* 8.0* 8.0* 7.9* 8.2* 8.6*  MG  --   --  2.2  --   --   --   PHOS 1.2* 1.6* 1.8* 3.3 1.9*  --    Liver Function Tests: Recent Labs  Lab 08/03/18 1108 08/03/18 1830 08/03/18 2346 08/04/18 0553 08/04/18 1046  ALBUMIN 3.2* 3.4* 3.2* 3.2* 3.4*   No results for input(s): LIPASE, AMYLASE in the last 168 hours. No results for input(s): AMMONIA in the last 168 hours. CBC: Recent Labs  Lab 08/03/18 0108 08/03/18 1108  WBC 15.8* 10.6*  NEUTROABS 10.5*  --   HGB 17.2* 15.6  HCT 50.0 44.1  MCV 85.6 86.0  PLT 328 276   Cardiac Enzymes: No results for input(s): CKTOTAL, CKMB, CKMBINDEX, TROPONINI in the last 168 hours. BNP: Invalid input(s): POCBNP CBG: No results for  input(s): GLUCAP in the last 168 hours. D-Dimer No results for input(s): DDIMER in the last 72 hours. Hgb A1c No results for input(s): HGBA1C in the last 72 hours. Lipid Profile No results for input(s): CHOL, HDL, LDLCALC, TRIG, CHOLHDL, LDLDIRECT in the last 72 hours. Thyroid function studies Recent Labs    08/03/18 1108  TSH 4.015   Anemia work up No results for input(s): VITAMINB12, FOLATE, FERRITIN, TIBC, IRON, RETICCTPCT in the last 72 hours. Urinalysis    Component Value Date/Time   COLORURINE STRAW (A) 08/03/2018 1833   APPEARANCEUR CLEAR 08/03/2018 1833   LABSPEC 1.005 08/03/2018 1833   PHURINE 7.0 08/03/2018 1833   GLUCOSEU NEGATIVE 08/03/2018 1833   HGBUR SMALL (A) 08/03/2018 1833   BILIRUBINUR NEGATIVE 08/03/2018 1833   KETONESUR NEGATIVE 08/03/2018  1833   PROTEINUR NEGATIVE 08/03/2018 1833   NITRITE NEGATIVE 08/03/2018 1833   LEUKOCYTESUR LARGE (A) 08/03/2018 1833   Sepsis Labs Invalid input(s): PROCALCITONIN,  WBC,  LACTICIDVEN Microbiology No results found for this or any previous visit (from the past 240 hour(s)).   Time coordinating discharge: Over 30 minutes  SIGNED:   Burke Keels, MD  [facility] 08/05/2018, 8:40 AM Pager   If 7PM-7AM, please contact night-coverage [email or web address] Password [email or web address]

## 2018-08-28 ENCOUNTER — Inpatient Hospital Stay: Payer: Self-pay | Admitting: Family Medicine

## 2019-06-21 ENCOUNTER — Ambulatory Visit (HOSPITAL_BASED_OUTPATIENT_CLINIC_OR_DEPARTMENT_OTHER)
Admission: EM | Admit: 2019-06-21 | Discharge: 2019-06-21 | Disposition: A | Discharge status: Home / Self Care | Payer: Self-pay | Attending: Emergency Medicine | Admitting: Emergency Medicine

## 2019-06-21 ENCOUNTER — Encounter (HOSPITAL_COMMUNITY)
Admission: EM | Disposition: A | Discharge status: Home / Self Care | Payer: Self-pay | Source: Home / Self Care | Attending: Emergency Medicine

## 2019-06-21 ENCOUNTER — Encounter (HOSPITAL_BASED_OUTPATIENT_CLINIC_OR_DEPARTMENT_OTHER): Payer: Self-pay

## 2019-06-21 ENCOUNTER — Emergency Department (HOSPITAL_COMMUNITY): Payer: Self-pay

## 2019-06-21 ENCOUNTER — Emergency Department (HOSPITAL_COMMUNITY): Payer: Self-pay | Admitting: Anesthesiology

## 2019-06-21 ENCOUNTER — Other Ambulatory Visit: Payer: Self-pay

## 2019-06-21 ENCOUNTER — Emergency Department (HOSPITAL_BASED_OUTPATIENT_CLINIC_OR_DEPARTMENT_OTHER): Payer: Self-pay

## 2019-06-21 DIAGNOSIS — N200 Calculus of kidney: Secondary | ICD-10-CM

## 2019-06-21 DIAGNOSIS — Z20828 Contact with and (suspected) exposure to other viral communicable diseases: Secondary | ICD-10-CM | POA: Insufficient documentation

## 2019-06-21 DIAGNOSIS — N132 Hydronephrosis with renal and ureteral calculous obstruction: Secondary | ICD-10-CM | POA: Insufficient documentation

## 2019-06-21 DIAGNOSIS — E876 Hypokalemia: Secondary | ICD-10-CM | POA: Insufficient documentation

## 2019-06-21 DIAGNOSIS — N179 Acute kidney failure, unspecified: Secondary | ICD-10-CM

## 2019-06-21 DIAGNOSIS — Z79899 Other long term (current) drug therapy: Secondary | ICD-10-CM | POA: Insufficient documentation

## 2019-06-21 DIAGNOSIS — F17210 Nicotine dependence, cigarettes, uncomplicated: Secondary | ICD-10-CM | POA: Insufficient documentation

## 2019-06-21 HISTORY — PX: CYSTOSCOPY W/ URETERAL STENT PLACEMENT: SHX1429

## 2019-06-21 LAB — CBC WITH DIFFERENTIAL/PLATELET
Abs Immature Granulocytes: 0.07 10*3/uL (ref 0.00–0.07)
Basophils Absolute: 0 10*3/uL (ref 0.0–0.1)
Basophils Relative: 0 %
Eosinophils Absolute: 0.1 10*3/uL (ref 0.0–0.5)
Eosinophils Relative: 1 %
HCT: 49.1 % (ref 39.0–52.0)
Hemoglobin: 16.7 g/dL (ref 13.0–17.0)
Immature Granulocytes: 1 %
Lymphocytes Relative: 6 %
Lymphs Abs: 0.8 10*3/uL (ref 0.7–4.0)
MCH: 30.2 pg (ref 26.0–34.0)
MCHC: 34 g/dL (ref 30.0–36.0)
MCV: 88.8 fL (ref 80.0–100.0)
Monocytes Absolute: 1.1 10*3/uL — ABNORMAL HIGH (ref 0.1–1.0)
Monocytes Relative: 8 %
Neutro Abs: 11.8 10*3/uL — ABNORMAL HIGH (ref 1.7–7.7)
Neutrophils Relative %: 84 %
Platelets: 227 10*3/uL (ref 150–400)
RBC: 5.53 MIL/uL (ref 4.22–5.81)
RDW: 14.6 % (ref 11.5–15.5)
WBC: 14 10*3/uL — ABNORMAL HIGH (ref 4.0–10.5)
nRBC: 0 % (ref 0.0–0.2)

## 2019-06-21 LAB — COMPREHENSIVE METABOLIC PANEL
ALT: 25 U/L (ref 0–44)
AST: 17 U/L (ref 15–41)
Albumin: 4.2 g/dL (ref 3.5–5.0)
Alkaline Phosphatase: 105 U/L (ref 38–126)
Anion gap: 9 (ref 5–15)
BUN: 19 mg/dL (ref 6–20)
CO2: 18 mmol/L — ABNORMAL LOW (ref 22–32)
Calcium: 8.4 mg/dL — ABNORMAL LOW (ref 8.9–10.3)
Chloride: 111 mmol/L (ref 98–111)
Creatinine, Ser: 2.46 mg/dL — ABNORMAL HIGH (ref 0.61–1.24)
GFR calc Af Amer: 39 mL/min — ABNORMAL LOW (ref >60)
GFR calc non Af Amer: 33 mL/min — ABNORMAL LOW (ref >60)
Glucose, Bld: 102 mg/dL — ABNORMAL HIGH (ref 70–99)
Potassium: 2.6 mmol/L — CL (ref 3.5–5.1)
Sodium: 138 mmol/L (ref 135–145)
Total Bilirubin: 1.1 mg/dL (ref 0.3–1.2)
Total Protein: 7 g/dL (ref 6.5–8.1)

## 2019-06-21 LAB — URINALYSIS, ROUTINE W REFLEX MICROSCOPIC
Bilirubin Urine: NEGATIVE
Glucose, UA: NEGATIVE mg/dL
Ketones, ur: NEGATIVE mg/dL
Nitrite: NEGATIVE
Protein, ur: NEGATIVE mg/dL
Specific Gravity, Urine: 1.01 (ref 1.005–1.030)
pH: 7.5 (ref 5.0–8.0)

## 2019-06-21 LAB — RESPIRATORY PANEL BY RT PCR (FLU A&B, COVID)
Influenza A by PCR: NEGATIVE
Influenza B by PCR: NEGATIVE
SARS Coronavirus 2 by RT PCR: NEGATIVE

## 2019-06-21 LAB — URINALYSIS, MICROSCOPIC (REFLEX): WBC, UA: >50 WBC/hpf (ref 0–5)

## 2019-06-21 LAB — LIPASE, BLOOD: Lipase: 34 U/L (ref 11–51)

## 2019-06-21 SURGERY — CYSTOSCOPY, WITH RETROGRADE PYELOGRAM AND URETERAL STENT INSERTION
Anesthesia: General | Laterality: Right

## 2019-06-21 SURGERY — CYSTOSCOPY, WITH STENT INSERTION
Anesthesia: General | Laterality: Right

## 2019-06-21 MED ORDER — ONDANSETRON HCL 4 MG/2ML IJ SOLN: 4.0000 mg | Freq: Once | INTRAMUSCULAR | Status: DC | PRN

## 2019-06-21 MED ORDER — OXYCODONE-ACETAMINOPHEN 5-325 MG PO TABS: 1.0000 | ORAL_TABLET | Freq: Four times a day (QID) | ORAL | 0 refills | Status: DC | PRN

## 2019-06-21 MED ORDER — POTASSIUM CHLORIDE ER 10 MEQ PO TBCR
10.0000 meq | EXTENDED_RELEASE_TABLET | Freq: Every day | ORAL | Status: DC
  Administered 2019-06-21: 10 meq via ORAL
  Filled 2019-06-21: qty 1

## 2019-06-21 MED ORDER — POTASSIUM CHLORIDE 10 MEQ/100ML IV SOLN
INTRAVENOUS | Status: DC | PRN
  Administered 2019-06-21: 10 meq via INTRAVENOUS

## 2019-06-21 MED ORDER — PROPOFOL 10 MG/ML IV BOLUS
INTRAVENOUS | Status: AC
  Filled 2019-06-21: qty 40

## 2019-06-21 MED ORDER — MIDAZOLAM HCL 5 MG/5ML IJ SOLN
INTRAMUSCULAR | Status: DC | PRN
  Administered 2019-06-21: 2 mg via INTRAVENOUS

## 2019-06-21 MED ORDER — SODIUM CHLORIDE 0.9 % IR SOLN
Status: DC | PRN
  Administered 2019-06-21: 3000 mL

## 2019-06-21 MED ORDER — HYDROMORPHONE HCL 1 MG/ML IJ SOLN: 0.2500 mg | INTRAMUSCULAR | Status: DC | PRN

## 2019-06-21 MED ORDER — MIDAZOLAM HCL 2 MG/2ML IJ SOLN
INTRAMUSCULAR | Status: AC
  Filled 2019-06-21: qty 2

## 2019-06-21 MED ORDER — SODIUM CHLORIDE 0.9 % IV BOLUS
500.0000 mL | Freq: Once | INTRAVENOUS | Status: AC
  Administered 2019-06-21: 500 mL via INTRAVENOUS

## 2019-06-21 MED ORDER — ONDANSETRON HCL 4 MG/2ML IJ SOLN
4.0000 mg | Freq: Once | INTRAMUSCULAR | Status: AC
  Administered 2019-06-21: 4 mg via INTRAVENOUS
  Filled 2019-06-21: qty 2

## 2019-06-21 MED ORDER — PROPOFOL 10 MG/ML IV BOLUS: INTRAVENOUS | Status: DC | PRN

## 2019-06-21 MED ORDER — LIDOCAINE HCL (CARDIAC) PF 50 MG/5ML IV SOSY: PREFILLED_SYRINGE | INTRAVENOUS | Status: DC | PRN

## 2019-06-21 MED ORDER — DEXAMETHASONE SODIUM PHOSPHATE 10 MG/ML IJ SOLN
INTRAMUSCULAR | Status: AC
  Filled 2019-06-21: qty 1

## 2019-06-21 MED ORDER — FENTANYL CITRATE (PF) 100 MCG/2ML IJ SOLN
INTRAMUSCULAR | Status: AC
  Filled 2019-06-21: qty 2

## 2019-06-21 MED ORDER — POTASSIUM CHLORIDE 10 MEQ/100ML IV SOLN
10.0000 meq | INTRAVENOUS | Status: AC
  Administered 2019-06-21: 10 meq via INTRAVENOUS

## 2019-06-21 MED ORDER — PROPOFOL 10 MG/ML IV BOLUS
INTRAVENOUS | Status: DC | PRN
  Administered 2019-06-21: 200 mg via INTRAVENOUS

## 2019-06-21 MED ORDER — LACTATED RINGERS IV SOLN: INTRAVENOUS | Status: DC | PRN

## 2019-06-21 MED ORDER — MEPERIDINE HCL 50 MG/ML IJ SOLN: 6.2500 mg | INTRAMUSCULAR | Status: DC | PRN

## 2019-06-21 MED ORDER — POTASSIUM CHLORIDE 10 MEQ/100ML IV SOLN
10.0000 meq | INTRAVENOUS | Status: AC
  Filled 2019-06-21 (×2): qty 100

## 2019-06-21 MED ORDER — LIDOCAINE 2% (20 MG/ML) 5 ML SYRINGE
INTRAMUSCULAR | Status: AC
  Filled 2019-06-21: qty 5

## 2019-06-21 MED ORDER — DEXAMETHASONE SODIUM PHOSPHATE 10 MG/ML IJ SOLN
INTRAMUSCULAR | Status: DC | PRN
  Administered 2019-06-21: 8 mg via INTRAVENOUS

## 2019-06-21 MED ORDER — PROPOFOL 10 MG/ML IV BOLUS
INTRAVENOUS | Status: AC
  Filled 2019-06-21: qty 20

## 2019-06-21 MED ORDER — FENTANYL CITRATE (PF) 250 MCG/5ML IJ SOLN
INTRAMUSCULAR | Status: AC
  Filled 2019-06-21: qty 5

## 2019-06-21 MED ORDER — OXYCODONE HCL 5 MG PO TABS: 5.0000 mg | ORAL_TABLET | Freq: Once | ORAL | Status: DC | PRN

## 2019-06-21 MED ORDER — FENTANYL CITRATE (PF) 100 MCG/2ML IJ SOLN
INTRAMUSCULAR | Status: DC | PRN
  Administered 2019-06-21 (×4): 50 ug via INTRAVENOUS

## 2019-06-21 MED ORDER — TAMSULOSIN HCL 0.4 MG PO CAPS: 0.4000 mg | ORAL_CAPSULE | Freq: Every day | ORAL | 0 refills | Status: DC

## 2019-06-21 MED ORDER — OXYCODONE HCL 5 MG/5ML PO SOLN: 5.0000 mg | Freq: Once | ORAL | Status: DC | PRN

## 2019-06-21 MED ORDER — HYDROMORPHONE HCL 1 MG/ML IJ SOLN
0.5000 mg | Freq: Once | INTRAMUSCULAR | Status: AC
  Administered 2019-06-21: 0.5 mg via INTRAVENOUS
  Filled 2019-06-21: qty 1

## 2019-06-21 MED ORDER — FENTANYL CITRATE (PF) 100 MCG/2ML IJ SOLN: INTRAMUSCULAR | Status: DC | PRN

## 2019-06-21 MED ORDER — DEXAMETHASONE SODIUM PHOSPHATE 10 MG/ML IJ SOLN
INTRAMUSCULAR | Status: AC
  Filled 2019-06-21: qty 2

## 2019-06-21 MED ORDER — SODIUM CHLORIDE 0.9 % IV SOLN
1.0000 g | Freq: Once | INTRAVENOUS | Status: AC
  Administered 2019-06-21: 1 g via INTRAVENOUS
  Filled 2019-06-21: qty 10

## 2019-06-21 MED ORDER — ACETAMINOPHEN 10 MG/ML IV SOLN: 1000.0000 mg | Freq: Once | INTRAVENOUS | Status: DC | PRN

## 2019-06-21 MED ORDER — ONDANSETRON HCL 4 MG/2ML IJ SOLN
INTRAMUSCULAR | Status: AC
  Filled 2019-06-21: qty 2

## 2019-06-21 MED ORDER — EPHEDRINE 5 MG/ML INJ
INTRAVENOUS | Status: AC
  Filled 2019-06-21: qty 10

## 2019-06-21 MED ORDER — SENNOSIDES-DOCUSATE SODIUM 8.6-50 MG PO TABS: 2.0000 | ORAL_TABLET | Freq: Every day | ORAL | 1 refills | Status: DC | PRN

## 2019-06-21 MED ORDER — MIDAZOLAM HCL 5 MG/5ML IJ SOLN: INTRAMUSCULAR | Status: DC | PRN

## 2019-06-21 MED ORDER — LIDOCAINE 2% (20 MG/ML) 5 ML SYRINGE
INTRAMUSCULAR | Status: DC | PRN
  Administered 2019-06-21: 100 mg via INTRAVENOUS

## 2019-06-21 MED ORDER — OXYBUTYNIN CHLORIDE 5 MG PO TABS: 5.0000 mg | ORAL_TABLET | Freq: Three times a day (TID) | ORAL | 0 refills | Status: DC | PRN

## 2019-06-21 MED ORDER — LACTATED RINGERS IV SOLN: INTRAVENOUS | Status: DC

## 2019-06-21 MED ORDER — CEPHALEXIN 500 MG PO CAPS: 500.0000 mg | ORAL_CAPSULE | Freq: Four times a day (QID) | ORAL | 0 refills | Status: AC

## 2019-06-21 MED ORDER — ONDANSETRON HCL 4 MG/2ML IJ SOLN
INTRAMUSCULAR | Status: DC | PRN
  Administered 2019-06-21: 4 mg via INTRAVENOUS

## 2019-06-21 MED ORDER — PHENYLEPHRINE 40 MCG/ML (10ML) SYRINGE FOR IV PUSH (FOR BLOOD PRESSURE SUPPORT)
PREFILLED_SYRINGE | INTRAVENOUS | Status: AC
  Filled 2019-06-21: qty 10

## 2019-06-21 SURGICAL SUPPLY — 14 items
BAG URO CATCHER STRL LF (MISCELLANEOUS) ×3 IMPLANT
CATH INTERMIT  6FR 70CM (CATHETERS) IMPLANT
CLOTH BEACON ORANGE TIMEOUT ST (SAFETY) ×3 IMPLANT
GLOVE BIO SURGEON STRL SZ 6 (GLOVE) ×9 IMPLANT
GOWN SPEC L3 MED W/TWL (GOWN DISPOSABLE) IMPLANT
GOWN STRL REUS W/TWL LRG LVL3 (GOWN DISPOSABLE) ×6 IMPLANT
GUIDEWIRE STR DUAL SENSOR (WIRE) ×3 IMPLANT
KIT TURNOVER KIT A (KITS) ×3 IMPLANT
MANIFOLD NEPTUNE II (INSTRUMENTS) ×3 IMPLANT
PACK CYSTO (CUSTOM PROCEDURE TRAY) ×3 IMPLANT
STENT CONTOUR 6FRX26X.038 (STENTS) ×3 IMPLANT
TUBING CONNECTING 10 (TUBING) ×2 IMPLANT
TUBING CONNECTING 10' (TUBING) ×1
TUBING UROLOGY SET (TUBING) IMPLANT

## 2019-06-21 NOTE — H&P (View-Only) (Signed)
I have been asked to see the patient by Dr. Junious Silk, for evaluation and management of right kidney stone.  History of present illness: 33 yo man presented to [facility] ED with right flank x 3 days pain found to have 1.3 cm right UPJ calculus.  WBC 14 and UA +WBCs.    Review of systems: A 12 point comprehensive review of systems was obtained and is negative unless otherwise stated in the history of present illness.  Patient Active Problem List   Diagnosis Date Noted  . Bronchitis   . 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 Hypokalemia 12/05/2017  . ARF (acute renal failure) ([place]) 12/05/2017  . Leucocytosis 12/05/2017  . Dehydration 12/05/2017    No current facility-administered medications on file prior to encounter.   Current Outpatient Medications on File Prior to Encounter  Medication Sig Dispense Refill  . aspirin-acetaminophen-caffeine (EXCEDRIN MIGRAINE) 250-250-65 MG tablet Take 2 tablets by mouth every 6 (six) hours as needed for headache.    . Multiple Vitamin (MULTIVITAMIN WITH MINERALS) TABS tablet Take 1 tablet by mouth daily.    . nicotine (NICODERM CQ - DOSED IN MG/24 HOURS) 21 mg/24hr patch Place 1 patch (21 mg total) onto the skin daily. 28 patch 0  . Potassium 99 MG TABS Take 9 tablets by mouth 2 (two) times daily.       Past Medical History:  Diagnosis Date  . Hypokalemia   . Hypokalemic periodic paralysis   . Kidney stones   . Renal disorder     Past Surgical History:  Procedure Laterality Date  . LITHOTRIPSY      Social History   Tobacco Use  . Smoking status: Current Every Day Smoker    Types: Cigarettes  . Smokeless tobacco: Never Used  Substance Use Topics  . Alcohol use: Never  . Drug use: Never    History reviewed. No pertinent family history.  PE: Vitals:   06/21/19 1135 06/21/19 1520  BP: 124/86 (!) 148/96  Pulse: 96 97  Resp: 14 18  Temp: 98.9 F (37.2 C) 99.1 F (37.3  C)  TempSrc: Oral Oral  SpO2: 100% 95%  Weight: 115.7 kg   Height: 5\' 11"  (1.803 m)    Patient appears to be in no acute distress  patient is alert and oriented x3 Atraumatic normocephalic head No cervical or supraclavicular lymphadenopathy appreciated No increased work of breathing, no audible wheezes/rhonchi Regular sinus rhythm/rate Abdomen is soft, nontender, nondistended, no CVA or suprapubic tenderness Lower extremities are symmetric without appreciable edema Grossly neurologically intact No identifiable skin lesions  Recent Labs    06/21/19 1225  WBC 14.0*  HGB 16.7  HCT 49.1   Recent Labs    06/21/19 1225  NA 138  K 2.6*  CL 111  CO2 18*  GLUCOSE 102*  BUN 19  CREATININE 2.46*  CALCIUM 8.4*   No results for input(s): LABPT, INR in the last 72 hours. No results for input(s): LABURIN in the last 72 hours. No results found for this or any previous visit.  Imaging: CT abd/pelvis:  IMPRESSION: 1. Moderate to prominent right hydronephrosis due to a 1.3 cm in long axis right proximal ureteral stone. 2. 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 3. Left kidney lower pole lesion has enlarged compared to 12/25/2017 and is probably a complex cyst. Another clearly complex lesion of the left kidney upper pole is homogeneous and probably a cyst. The enhancement characteristics of these lesions  are not assessed; if further workup for a renal mass is warranted, possibilities might include ultrasound or contrast enhanced cross-sectional imaging in the nonacute setting.  Imp: 33 yo man with 1.3 cm right proximal ureteral calculus with signs of infection.  Recommendations: -management options for kidney stone discussed including observation vs cysto/stent -risks and benefit of cysto stent discussed with patient and informed consent obtained -to OR today for cysto with right ureteral stent placement   Sister Carbone D Miko Markwood

## 2019-06-21 NOTE — Anesthesia Preprocedure Evaluation (Addendum)
Anesthesia Evaluation  Patient identified by MRN, date of birth, ID band Patient awake    Reviewed: Allergy & Precautions, NPO status , Patient's Chart, lab work & pertinent test results  Airway Mallampati: II  TM Distance: >3 FB Neck ROM: Full    Dental no notable dental hx. 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  Neuromuscular disease negative psych ROS   GI/Hepatic negative GI ROS,   Endo/Other  negative endocrine ROS  Renal/GU Renal diseaseR Ureteral Calculus K+ 2.6     Musculoskeletal negative musculoskeletal ROS (+)   Abdominal   Peds  Hematology Hgb 16.7   Anesthesia Other Findings   Reproductive/Obstetrics negative OB ROS                            Anesthesia Physical Anesthesia Plan  ASA: II  Anesthesia Plan: General   Post-op Pain Management:    Induction: Intravenous  PONV Risk Score and Plan: Treatment may vary due to age or medical condition and Ondansetron  Airway Management Planned: LMA  Additional Equipment: None  Intra-op Plan:   Post-operative Plan:   Informed Consent: I have reviewed the patients History and Physical, chart, labs and discussed the procedure including the risks, benefits and alternatives for the proposed anesthesia with the patient or authorized representative who has indicated his/her understanding and acceptance.     Dental advisory given  Plan Discussed with: CRNA  Anesthesia Plan Comments:         Anesthesia Quick Evaluation

## 2019-06-21 NOTE — Interval H&P Note (Signed)
History and Physical Interval Note:  06/21/2019 6:36 PM  Adam Weaver  has presented today for surgery, with the diagnosis of RIGHT URETERAL STONE.  The various methods of treatment have been discussed with the patient and family. After consideration of risks, benefits and other options for treatment, the patient has consented to  Procedure(s): [place] (Right) as a surgical intervention.  The patient's history has been reviewed, patient examined, no change in status, stable for surgery.  I have reviewed the patient's chart and labs.  Questions were answered to the patient's satisfaction.     Jolynda Townley D Anaiza Behrens

## 2019-06-21 NOTE — Discharge Instructions (Signed)

## 2019-06-21 NOTE — Op Note (Signed)
PATIENT:  Adam Weaver  Preoperative diagnosis:  1. Right UPJ calculus  Postoperative diagnosis:   1. Right UPJ caclulus  Procedure:  1. Cystoscopy 2. right ureteral stent placement  (6Frx26cm)   Surgeon: Jacalyn Lefevre, M.D.  Anesthesia: General  Complications: None  EBL: Minimal  Specimens: None  Indication: 33 yo man with 3 day hx of right flank pain found to have 1.3 cm right ureteral calculus.   Description of procedure:  The patient was taken to the operating room and general anesthesia was induced.  The patient was placed in the dorsal lithotomy position, prepped and draped in the usual sterile fashion, and preoperative antibiotics were administered. A preoperative time-out was performed.   Cystourethroscopy was performed.  The patient's urethra was examined and was normal.  The bladder was then systematically examined in its entirety. 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  Attention then turned to the right ureteral orifice and a  0.038 sensor guidewire was then advanced up the right ureter into the renal pelvis under fluoroscopic guidance.  The ureteral stent was advance over the wire using Seldinger technique.  The stent was positioned appropriately under fluoroscopic and cystoscopic guidance.  The wire was then removed with an adequate stent curl noted in the renal pelvis as well as in the bladder.  The bladder was then emptied and the procedure ended.  The patient appeared to tolerate the procedure well and without complications.  The patient was able to be awakened and transferred to the recovery unit in satisfactory condition.   Follow up: Due to purulent efflux seen after stent placement patient will be placed on antibiotics.  He will be scheduled for second surgery in approx 3 weeks.

## 2019-06-21 NOTE — Transfer of Care (Signed)
Immediate Anesthesia Transfer of Care Note  Patient: Adam Weaver  Procedure(s) Performed: CYSTOSCOPY WITH RIGHT URETERAL STENT PLACEMENT (Right )  Patient Location: PACU  Anesthesia Type:General  Level of Consciousness: drowsy, patient cooperative and responds to stimulation  Airway & Oxygen Therapy: Patient Spontanous Breathing and Patient connected to face mask oxygen  Post-op Assessment: Report given to RN and Post -op Vital signs reviewed and stable  Post vital signs: Reviewed and stable  Last Vitals:  Vitals Value Taken Time  BP 130/79 06/21/19 1923  Temp    Pulse 101 06/21/19 1925  Resp 29 06/21/19 1925  SpO2 100 % 06/21/19 1925  Vitals shown include unvalidated device data.  Last Pain:  Vitals:   06/21/19 1838  TempSrc:   PainSc: 4       Patients Stated Pain Goal: 1 (02/63/78 5885)  Complications: No apparent anesthesia complications

## 2019-06-21 NOTE — Anesthesia Procedure Notes (Addendum)
Procedure Name: LMA Insertion Date/Time: 06/21/2019 6:57 PM Performed by: Silas Sacramento, CRNA Pre-anesthesia Checklist: Patient identified, Emergency Drugs available, Suction available and Patient being monitored Patient Re-evaluated:Patient Re-evaluated prior to induction Oxygen Delivery Method: Circle system utilized Preoxygenation: Pre-oxygenation with 100% oxygen Induction Type: IV induction LMA: LMA with gastric port inserted LMA Size: 5.0 Tube type: Oral Number of attempts: 1 Placement Confirmation: positive ETCO2 and breath sounds checked- equal and bilateral Tube secured with: Tape Dental Injury: Teeth and Oropharynx as per pre-operative assessment

## 2019-06-21 NOTE — ED Triage Notes (Signed)
Patient complains of right flank pain for the past 3 days. He presented today at [facility] and was transport to [facility].

## 2019-06-21 NOTE — ED Provider Notes (Signed)
[facility] EMERGENCY DEPARTMENT Provider Note   CSN: [id number] Arrival date & time: 06/21/19  1053     History Chief Complaint  Patient presents with  . Flank Pain    Adam Weaver is a 33 y.o. male presenting for evaluation of R sided flank pain.   Pt states for the past 3 days, he has had a lot of R flank pain/pressure. He reports associated nausea and sweats. Pain is constant, but waxing and waning in intensity.  Patient reports a history of CKD stage III, unknown cause, no history of hypertension or diabetes.  He also states he has hypokalemia frequently, takes potassium every day.  He takes no other medications daily.  He has not had anything for pain today.  He last ate and drink last night.  Patient states he normally urinates 6-7 times a day, but over the past couple days has only been able to urinate twice a day.  No increased pain with urination.  He denies chest pain, shortness of breath, cough, anterior abdominal pain, dysuria, hematuria, or abnormal bowel movements. Patient states he does not follow-up with a primary care doctor or nephrologist due to lack of insurance. He reports frequent history of kidney stones.   HPI     Past Medical History:  Diagnosis Date  . Hypokalemia   . Hypokalemic periodic paralysis   . Kidney stones   . Renal disorder     Patient Active Problem List   Diagnosis Date Noted  . Bronchitis   . 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 Hypokalemia 12/05/2017  . ARF (acute renal failure) (HCC) 12/05/2017  . Leucocytosis 12/05/2017  . Dehydration 12/05/2017    Past Surgical History:  Procedure Laterality Date  . LITHOTRIPSY         No family history on file.  Social History   Tobacco Use  . Smoking status: Current Every Day Smoker    Types: Cigarettes  . Smokeless tobacco: Never Used  Substance Use Topics  . Alcohol use: Never  . Drug use: Never     Home Medications Prior to Admission medications   Medication Sig Start Date End Date Taking? Authorizing Provider  aspirin-acetaminophen-caffeine (EXCEDRIN MIGRAINE) [phone] MG tablet Take 2 tablets by mouth every 6 (six) hours as needed for headache.    [provider]  Multiple Vitamin (MULTIVITAMIN WITH MINERALS) TABS tablet Take 1 tablet by mouth daily.    [provider]  nicotine (NICODERM CQ - DOSED IN MG/24 HOURS) 21 mg/24hr patch Place 1 patch (21 mg total) onto the skin daily. 03/18/18   Alwyn Ren, MD  Potassium 99 MG TABS Take 9 tablets by mouth 2 (two) times daily.     [provider]    Allergies    Zithromax [azithromycin] and Ciprofloxacin  Review of Systems   Review of Systems  Gastrointestinal: Positive for nausea.  Genitourinary: Positive for decreased urine volume, difficulty urinating and flank pain.  All other systems reviewed and are negative.   Physical Exam Updated Vital Signs BP 124/86 (BP Location: Left Arm)   Pulse 96   Temp 98.9 F (37.2 C) (Oral)   Resp 14   Ht  (1.803 m)   Wt 115.7 kg   SpO2 100%   BMI 35.57 kg/m   Physical Exam Vitals and nursing note reviewed.  Constitutional:      General: He is not in acute distress.    Appearance: He is  well-developed.     Comments: Appears uncomfortable, but nontoxic  HENT:     Head: Normocephalic and atraumatic.  Eyes:     Extraocular Movements: Extraocular movements intact.     Conjunctiva/sclera: Conjunctivae normal.     Pupils: Pupils are equal, round, and reactive to light.  Cardiovascular:     Rate and Rhythm: Normal rate and regular rhythm.     Pulses: Normal pulses.  Pulmonary:     Effort: Pulmonary effort is normal. No respiratory distress.     Breath sounds: Normal breath sounds. No wheezing.  Abdominal:     General: There is no distension.     Palpations: Abdomen is soft. There is no mass.     Tenderness: There is no abdominal tenderness.  There is right CVA tenderness. There is no guarding or rebound.     Comments: Right CVA tenderness.  No tenderness to palpation of the anterior abdomen.  No rigidity, guarding or distention.  Negative rebound.  Musculoskeletal:        General: Normal range of motion.     Cervical back: Normal range of motion and neck supple.  Skin:    General: Skin is warm and dry.     Capillary Refill: Capillary refill takes less than 2 seconds.  Neurological:     Mental Status: He is alert and oriented to person, place, and time.     ED Results / Procedures / Treatments   Labs (all labs ordered are listed, but only abnormal results are displayed) Labs Reviewed  URINALYSIS, ROUTINE W REFLEX MICROSCOPIC - Abnormal; Notable for the following components:      Result Value   APPearance CLOUDY (*)    Hgb urine dipstick LARGE (*)    Leukocytes,Ua LARGE (*)    All other components within normal limits  URINALYSIS, MICROSCOPIC (REFLEX) - Abnormal; Notable for the following components:   Bacteria, UA FEW (*)    All other components within normal limits  CBC WITH DIFFERENTIAL/PLATELET - Abnormal; Notable for the following components:   WBC 14.0 (*)    Neutro Abs 11.8 (*)    Monocytes Absolute 1.1 (*)    All other components within normal limits  COMPREHENSIVE METABOLIC PANEL - Abnormal; Notable for the following components:   Potassium 2.6 (*)    CO2 18 (*)    Glucose, Bld 102 (*)    Creatinine, Ser 2.46 (*)    Calcium 8.4 (*)    GFR calc non Af Amer 33 (*)    GFR calc Af Amer 39 (*)    All other components within normal limits  URINE CULTURE  RESPIRATORY PANEL BY RT PCR (FLU A&B, COVID)  LIPASE, BLOOD    EKG None  Radiology CT Renal Stone Study  Result Date: 06/21/2019 CLINICAL DATA:  Right flank pain. Increased urine output for 2 days. History of renal calculi. EXAM: CT ABDOMEN AND PELVIS WITHOUT CONTRAST TECHNIQUE: Multidetector CT imaging of the abdomen and pelvis was performed  following the standard protocol without IV contrast. COMPARISON:  12/25/2017 FINDINGS: Lower chest: Unremarkable Hepatobiliary: Unremarkable Pancreas: Unremarkable Spleen: Unremarkable Adrenals/Urinary Tract: Both adrenal glands appear normal. Innumerable bilateral renal calculi noted with possible medullary nephrocalcinosis contributing to the appearance of dense clustered calcifications bilaterally. Moderate to prominent right hydronephrosis is present extending down to a 1.3 cm in long axis right proximal ureteral stone shown on image 60/5. Right ureter distal to this stone is of normal caliber. No left hydronephrosis. A fairly homogeneous 1.6 by 1.6 cm  hyperdense exophytic lesion of the left kidney upper pole on image 39/2 is present and measured 1.6 by 1.4 cm on 12/25/2017. This is most likely a complex cyst although its enhancement characteristics are not assessed. A left kidney lower pole lesion measuring 2.9 by 2.6 by 2.5 cm on image 67/5 is present, this previously measured 2.7 by 2.4 by 2.3 cm. This lesion has fluid density internally but some subtle heterogeneity could have mild complexity. Other hypodense lesions of the left kidney are technically too small to characterize. Urinary bladder unremarkable. Stomach/Bowel: Unremarkable Vascular/Lymphatic: Unremarkable Reproductive: Unremarkable Other: No supplemental non-categorized findings. Musculoskeletal: Unremarkable IMPRESSION: 1. Moderate to prominent right hydronephrosis due to a 1.3 cm in long axis right proximal ureteral stone. 2. 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 3. Left kidney lower pole lesion has enlarged compared to 12/25/2017 and is probably a complex cyst. Another clearly complex lesion of the left kidney upper pole is homogeneous and probably a cyst. The enhancement characteristics of these lesions are not assessed; if further workup for a renal mass is warranted, possibilities might include ultrasound or  contrast enhanced cross-sectional imaging in the nonacute setting. Electronically Signed   By: Van Clines M.D.   On: 06/21/2019 13:19    Procedures .Critical Care Performed by: Franchot Heidelberg, PA-C Authorized by: Franchot Heidelberg, PA-C   Critical care provider statement:    Critical care time (minutes):  45   Critical care time was exclusive of:  Separately billable procedures and treating other patients and teaching time   Critical care was necessary to treat or prevent imminent or life-threatening deterioration of the following conditions:  Metabolic crisis   Critical care was time spent personally by me on the following activities:  Blood draw for specimens, development of treatment plan with patient or surrogate, evaluation of patient's response to treatment, discussions with consultants, examination of patient, obtaining history from patient or surrogate, ordering and performing treatments and interventions, ordering and review of laboratory studies, ordering and review of radiographic studies, pulse oximetry, re-evaluation of patient's condition and review of old charts   I assumed direction of critical care for this patient from another provider in my specialty: no   Comments:     Pt with low potassium at 2.6, IV medicine started. Pt with possible infected kidney stone requiring transfer and urologic intervention.    (including critical care time)  Medications Ordered in ED Medications  HYDROmorphone (DILAUDID) injection 0.5 mg (has no administration in time range)  ondansetron (ZOFRAN) injection 4 mg (has no administration in time range)  cefTRIAXone (ROCEPHIN) 1 g in sodium chloride 0.9 % 100 mL IVPB (has no administration in time range)  potassium chloride 10 mEq in 100 mL IVPB (has no administration in time range)    ED Course  I have reviewed the triage vital signs and the nursing notes.  Pertinent labs & imaging results that were available during my care of the  patient were reviewed by me and considered in my medical decision making (see chart for details).    MDM Rules/Calculators/A&P                      Patient presenting for evaluation of flank pain.  Physical exam shows patient appears uncomfortable, but nontoxic.  He does have CVA tenderness.  History of kidney stones and CKD.  Will obtain labs and CT renal for further evaluation.  Labs show hypokalemia 2.6, despite patient taking oral potassium.  Will give 3  rounds of IV potassium.  Creatinine went from 1.7-2.4.  White count of 14, large leuks and greater than 50 white cells with few bacteria.  Concern for possible infected stone.  CT shows 1.3 cm stone with significant hydro-.  Patient also with multiple renal cysts.  Patient will likely need urologic intervention, as such we will consult with urology.  [facility] Dr[facility] and further discussion on treatment.  Discussed with Dr. Ranae PalmsYelverton who accepts the patient in transfer.  Will start patient on antibiotics, potassium, will give pain and nausea control.  Tomorrow Covid test ordered as patient will likely need intervention.  Patient remain n.p.o.  Discussed findings and plan with patient, who is agreeable.   Final Clinical Impression(s) / ED Diagnoses Final diagnoses:  Right kidney stone  Hypokalemia  AKI (acute kidney injury) [facility](HCC)    Rx / DC Orders ED Discharge Orders    None       Alveria ApleyCaccavale, Talen Poser, PA-C 06/21/19 1404    Alvira MondaySchlossman, Erin, MD 06/22/19 [phone]

## 2019-06-21 NOTE — Anesthesia Postprocedure Evaluation (Signed)
Anesthesia Post Note  Patient: Adam Weaver  Procedure(s) Performed: CYSTOSCOPY WITH RIGHT URETERAL STENT PLACEMENT (Right )     Patient location during evaluation: PACU Anesthesia Type: General Level of consciousness: awake and alert Pain management: pain level controlled Vital Signs Assessment: post-procedure vital signs reviewed and stable Respiratory status: spontaneous breathing, nonlabored ventilation, respiratory function stable and patient connected to nasal cannula oxygen Cardiovascular status: blood pressure returned to baseline and stable Postop Assessment: no apparent nausea or vomiting Anesthetic complications: no    Last Vitals:  Vitals:   06/21/19 2000 06/21/19 2015  BP: (!) 143/89 (!) 144/87  Pulse: 99 98  Resp: (!) 21 20  Temp: 37.3 C 37.2 C  SpO2: 96% 98%    Last Pain:  Vitals:   06/21/19 2015  TempSrc:   PainSc: 0-No pain                 Barnet Glasgow

## 2019-06-21 NOTE — Consult Note (Signed)
I have been asked to see the patient by Dr. Junious Silk, for evaluation and management of right kidney stone.  History of present illness: 33 yo man presented to [facility] ED with right flank x 3 days pain found to have 1.3 cm right UPJ calculus.  WBC 14 and UA +WBCs.    Review of systems: A 12 point comprehensive review of systems was obtained and is negative unless otherwise stated in the history of present illness.  Patient Active Problem List   Diagnosis Date Noted  . Bronchitis   . 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 Hypokalemia 12/05/2017  . ARF (acute renal failure) ([place]) 12/05/2017  . Leucocytosis 12/05/2017  . Dehydration 12/05/2017    No current facility-administered medications on file prior to encounter.   Current Outpatient Medications on File Prior to Encounter  Medication Sig Dispense Refill  . aspirin-acetaminophen-caffeine (EXCEDRIN MIGRAINE) 250-250-65 MG tablet Take 2 tablets by mouth every 6 (six) hours as needed for headache.    . Multiple Vitamin (MULTIVITAMIN WITH MINERALS) TABS tablet Take 1 tablet by mouth daily.    . nicotine (NICODERM CQ - DOSED IN MG/24 HOURS) 21 mg/24hr patch Place 1 patch (21 mg total) onto the skin daily. 28 patch 0  . Potassium 99 MG TABS Take 9 tablets by mouth 2 (two) times daily.       Past Medical History:  Diagnosis Date  . Hypokalemia   . Hypokalemic periodic paralysis   . Kidney stones   . Renal disorder     Past Surgical History:  Procedure Laterality Date  . LITHOTRIPSY      Social History   Tobacco Use  . Smoking status: Current Every Day Smoker    Types: Cigarettes  . Smokeless tobacco: Never Used  Substance Use Topics  . Alcohol use: Never  . Drug use: Never    History reviewed. No pertinent family history.  PE: Vitals:   06/21/19 1135 06/21/19 1520  BP: 124/86 (!) 148/96  Pulse: 96 97  Resp: 14 18  Temp: 98.9 F (37.2 C) 99.1 F (37.3  C)  TempSrc: Oral Oral  SpO2: 100% 95%  Weight: 115.7 kg   Height: 5\' 11"  (1.803 m)    Patient appears to be in no acute distress  patient is alert and oriented x3 Atraumatic normocephalic head No cervical or supraclavicular lymphadenopathy appreciated No increased work of breathing, no audible wheezes/rhonchi Regular sinus rhythm/rate Abdomen is soft, nontender, nondistended, no CVA or suprapubic tenderness Lower extremities are symmetric without appreciable edema Grossly neurologically intact No identifiable skin lesions  Recent Labs    06/21/19 1225  WBC 14.0*  HGB 16.7  HCT 49.1   Recent Labs    06/21/19 1225  NA 138  K 2.6*  CL 111  CO2 18*  GLUCOSE 102*  BUN 19  CREATININE 2.46*  CALCIUM 8.4*   No results for input(s): LABPT, INR in the last 72 hours. No results for input(s): LABURIN in the last 72 hours. No results found for this or any previous visit.  Imaging: CT abd/pelvis:  IMPRESSION: 1. Moderate to prominent right hydronephrosis due to a 1.3 cm in long axis right proximal ureteral stone. 2. 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 3. Left kidney lower pole lesion has enlarged compared to 12/25/2017 and is probably a complex cyst. Another clearly complex lesion of the left kidney upper pole is homogeneous and probably a cyst. The enhancement characteristics of these lesions  are not assessed; if further workup for a renal mass is warranted, possibilities might include ultrasound or contrast enhanced cross-sectional imaging in the nonacute setting.  Imp: 33 yo man with 1.3 cm right proximal ureteral calculus with signs of infection.  Recommendations: -management options for kidney stone discussed including observation vs cysto/stent -risks and benefit of cysto stent discussed with patient and informed consent obtained -to OR today for cysto with right ureteral stent placement   Sharnette Kitamura D Wiley Magan

## 2019-06-21 NOTE — ED Triage Notes (Signed)
Pt c/o right flank pain, increase UO x 2 days-NAD-steady gait

## 2019-06-21 NOTE — ED Provider Notes (Signed)
Transfer from previous provider Sophia Caccavale, PA-C from [facility]  See previous providers note for full H&P.  Briefly, patient presenting with concern for infected stone.  He is found to have a 1.3 cm right ureteral stone with moderate to prominent hydronephrosis.  He has a little UA concerning for infection with large leukocytes, greater than WBCs.  Hypokalemic to 2.6, replacement initiated MCHP.  Creatinine up to 2.46.  Leukocytosis at 14K.  Urology was consulted and will see the patient, anticipate intervention.  Physical Exam  BP (!) 148/96 (BP Location: Right Arm)   Pulse 97   Temp 99.1 F (37.3 C) (Oral)   Resp 18   Ht 5\' 11"  (1.803 m)   Wt 115.7 kg   SpO2 95%   BMI 35.57 kg/m   Physical Exam Vitals and nursing note reviewed.  Constitutional:      General: He is not in acute distress.    Appearance: He is well-developed. He is not diaphoretic.  HENT:     Head: Normocephalic and atraumatic.     Mouth/Throat:     Pharynx: No oropharyngeal exudate.  Eyes:     General: No scleral icterus.       Right eye: No discharge.        Left eye: No discharge.     Conjunctiva/sclera: Conjunctivae normal.     Pupils: Pupils are equal, round, and reactive to light.  Neck:     Thyroid: No thyromegaly.  Cardiovascular:     Rate and Rhythm: Normal rate and regular rhythm.     Heart sounds: Normal heart sounds. No murmur. No friction rub. No gallop.   Pulmonary:     Effort: Pulmonary effort is normal. No respiratory distress.     Breath sounds: Normal breath sounds. No stridor. No wheezing or rales.  Abdominal:     General: Bowel sounds are normal. There is no distension.     Palpations: Abdomen is soft.     Tenderness: There is no abdominal tenderness. There is right CVA tenderness. There is no left CVA tenderness, guarding or rebound.  Musculoskeletal:     Cervical back: Normal range of motion and neck supple.  Lymphadenopathy:     Cervical: No cervical adenopathy.  Skin:  General: Skin is warm and dry.     Coloration: Skin is not pale.     Findings: No rash.  Neurological:     Mental Status: He is alert.     Coordination: Coordination normal.     ED Course/Procedures     Procedures  MDM  Patient presenting for ureteral stent by Dr. Claudia Desanctis, urologist.  Potassium initiated in the ED.  Pain control with Dilaudid. NPO status. COVID-19 screening negative.  Transfer of care to Dr. Claudia Desanctis.  I appreciate her assistance with the patient.       Frederica Kuster, PA-C 06/21/19 1919    Julianne Rice, MD 06/24/19 [phone]

## 2019-06-21 NOTE — ED Notes (Signed)
Report to [facility] staff at bedside, pt aware of plan to transfer to [facility] for urology consult. Rocephin cont infusing at transfer, sent Kcl 10 meq in 162ml ns with [facility] staff for infusion after rocephin finished.

## 2019-06-22 ENCOUNTER — Inpatient Hospital Stay (HOSPITAL_COMMUNITY)
Admission: EM | Admit: 2019-06-22 | Discharge: 2019-06-24 | DRG: 092 | Disposition: A | Discharge status: Home / Self Care | Payer: Medicaid Other | Attending: Internal Medicine | Admitting: Internal Medicine

## 2019-06-22 DIAGNOSIS — Z87442 Personal history of urinary calculi: Secondary | ICD-10-CM

## 2019-06-22 DIAGNOSIS — D72829 Elevated white blood cell count, unspecified: Secondary | ICD-10-CM

## 2019-06-22 DIAGNOSIS — Z20828 Contact with and (suspected) exposure to other viral communicable diseases: Secondary | ICD-10-CM | POA: Diagnosis present

## 2019-06-22 DIAGNOSIS — F17210 Nicotine dependence, cigarettes, uncomplicated: Secondary | ICD-10-CM | POA: Diagnosis present

## 2019-06-22 DIAGNOSIS — N390 Urinary tract infection, site not specified: Secondary | ICD-10-CM

## 2019-06-22 DIAGNOSIS — D751 Secondary polycythemia: Secondary | ICD-10-CM

## 2019-06-22 DIAGNOSIS — Z960 Presence of urogenital implants: Secondary | ICD-10-CM | POA: Diagnosis present

## 2019-06-22 DIAGNOSIS — K219 Gastro-esophageal reflux disease without esophagitis: Secondary | ICD-10-CM | POA: Diagnosis present

## 2019-06-22 DIAGNOSIS — Z8249 Family history of ischemic heart disease and other diseases of the circulatory system: Secondary | ICD-10-CM

## 2019-06-22 DIAGNOSIS — Z833 Family history of diabetes mellitus: Secondary | ICD-10-CM

## 2019-06-22 DIAGNOSIS — N189 Chronic kidney disease, unspecified: Secondary | ICD-10-CM

## 2019-06-22 DIAGNOSIS — E669 Obesity, unspecified: Secondary | ICD-10-CM | POA: Diagnosis present

## 2019-06-22 DIAGNOSIS — N200 Calculus of kidney: Secondary | ICD-10-CM | POA: Insufficient documentation

## 2019-06-22 DIAGNOSIS — Z79899 Other long term (current) drug therapy: Secondary | ICD-10-CM

## 2019-06-22 DIAGNOSIS — G723 Periodic paralysis: Principal | ICD-10-CM | POA: Diagnosis present

## 2019-06-22 DIAGNOSIS — N136 Pyonephrosis: Secondary | ICD-10-CM | POA: Diagnosis present

## 2019-06-22 DIAGNOSIS — E876 Hypokalemia: Secondary | ICD-10-CM

## 2019-06-22 DIAGNOSIS — Z6835 Body mass index (BMI) 35.0-35.9, adult: Secondary | ICD-10-CM

## 2019-06-22 DIAGNOSIS — N1831 Chronic kidney disease, stage 3a: Secondary | ICD-10-CM | POA: Diagnosis present

## 2019-06-22 DIAGNOSIS — N179 Acute kidney failure, unspecified: Secondary | ICD-10-CM | POA: Diagnosis present

## 2019-06-22 DIAGNOSIS — Z881 Allergy status to other antibiotic agents status: Secondary | ICD-10-CM

## 2019-06-22 DIAGNOSIS — R739 Hyperglycemia, unspecified: Secondary | ICD-10-CM | POA: Diagnosis present

## 2019-06-22 HISTORY — DX: Calculus of kidney: N20.0

## 2019-06-22 LAB — COMPREHENSIVE METABOLIC PANEL
ALT: 23 U/L (ref 0–44)
ALT: 20 U/L (ref 0–44)
AST: 14 U/L — ABNORMAL LOW (ref 15–41)
AST: 16 U/L (ref 15–41)
Albumin: 4.1 g/dL (ref 3.5–5.0)
Albumin: 3.5 g/dL (ref 3.5–5.0)
Alkaline Phosphatase: 90 U/L (ref 38–126)
Alkaline Phosphatase: 80 U/L (ref 38–126)
Anion gap: 11 (ref 5–15)
Anion gap: 11 (ref 5–15)
BUN: 22 mg/dL — ABNORMAL HIGH (ref 6–20)
BUN: 24 mg/dL — ABNORMAL HIGH (ref 6–20)
CO2: 11 mmol/L — ABNORMAL LOW (ref 22–32)
CO2: 12 mmol/L — ABNORMAL LOW (ref 22–32)
Calcium: 8.9 mg/dL (ref 8.9–10.3)
Calcium: 8.2 mg/dL — ABNORMAL LOW (ref 8.9–10.3)
Chloride: 117 mmol/L — ABNORMAL HIGH (ref 98–111)
Chloride: 118 mmol/L — ABNORMAL HIGH (ref 98–111)
Creatinine, Ser: 2.22 mg/dL — ABNORMAL HIGH (ref 0.61–1.24)
Creatinine, Ser: 1.96 mg/dL — ABNORMAL HIGH (ref 0.61–1.24)
GFR calc Af Amer: 44 mL/min — ABNORMAL LOW (ref >60)
GFR calc Af Amer: 51 mL/min — ABNORMAL LOW (ref >60)
GFR calc non Af Amer: 38 mL/min — ABNORMAL LOW (ref >60)
GFR calc non Af Amer: 44 mL/min — ABNORMAL LOW (ref >60)
Glucose, Bld: 139 mg/dL — ABNORMAL HIGH (ref 70–99)
Glucose, Bld: 102 mg/dL — ABNORMAL HIGH (ref 70–99)
Potassium: 2.2 mmol/L — CL (ref 3.5–5.1)
Potassium: 2.6 mmol/L — CL (ref 3.5–5.1)
Sodium: 139 mmol/L (ref 135–145)
Sodium: 141 mmol/L (ref 135–145)
Total Bilirubin: 1 mg/dL (ref 0.3–1.2)
Total Bilirubin: 0.6 mg/dL (ref 0.3–1.2)
Total Protein: 7.2 g/dL (ref 6.5–8.1)
Total Protein: 6.6 g/dL (ref 6.5–8.1)

## 2019-06-22 LAB — CBC WITH DIFFERENTIAL/PLATELET
Abs Immature Granulocytes: 0.07 10*3/uL (ref 0.00–0.07)
Basophils Absolute: 0 10*3/uL (ref 0.0–0.1)
Basophils Relative: 0 %
Eosinophils Absolute: 0 10*3/uL (ref 0.0–0.5)
Eosinophils Relative: 0 %
HCT: 51.9 % (ref 39.0–52.0)
Hemoglobin: 17.2 g/dL — ABNORMAL HIGH (ref 13.0–17.0)
Immature Granulocytes: 1 %
Lymphocytes Relative: 6 %
Lymphs Abs: 0.8 10*3/uL (ref 0.7–4.0)
MCH: 30.4 pg (ref 26.0–34.0)
MCHC: 33.1 g/dL (ref 30.0–36.0)
MCV: 91.7 fL (ref 80.0–100.0)
Monocytes Absolute: 0.6 10*3/uL (ref 0.1–1.0)
Monocytes Relative: 4 %
Neutro Abs: 12.4 10*3/uL — ABNORMAL HIGH (ref 1.7–7.7)
Neutrophils Relative %: 89 %
Platelets: 238 10*3/uL (ref 150–400)
RBC: 5.66 MIL/uL (ref 4.22–5.81)
RDW: 14.6 % (ref 11.5–15.5)
WBC: 13.8 10*3/uL — ABNORMAL HIGH (ref 4.0–10.5)
nRBC: 0 % (ref 0.0–0.2)

## 2019-06-22 LAB — SARS CORONAVIRUS 2 (TAT 6-24 HRS): SARS Coronavirus 2: NEGATIVE

## 2019-06-22 LAB — HIV ANTIBODY (ROUTINE TESTING W REFLEX): HIV Screen 4th Generation wRfx: NONREACTIVE

## 2019-06-22 LAB — MAGNESIUM: Magnesium: 2.5 mg/dL — ABNORMAL HIGH (ref 1.7–2.4)

## 2019-06-22 LAB — TSH: TSH: 0.791 u[IU]/mL (ref 0.350–4.500)

## 2019-06-22 MED ORDER — SODIUM CHLORIDE 0.9 % IV BOLUS (SEPSIS)
1000.0000 mL | Freq: Once | INTRAVENOUS | Status: AC
  Administered 2019-06-22: 1000 mL via INTRAVENOUS

## 2019-06-22 MED ORDER — POTASSIUM CHLORIDE 10 MEQ/100ML IV SOLN
10.0000 meq | INTRAVENOUS | Status: AC
  Administered 2019-06-22 (×6): 10 meq via INTRAVENOUS
  Filled 2019-06-22 (×6): qty 100

## 2019-06-22 MED ORDER — POLYETHYLENE GLYCOL 3350 17 G PO PACK: 17.0000 g | PACK | Freq: Every day | ORAL | Status: DC | PRN

## 2019-06-22 MED ORDER — ALUM & MAG HYDROXIDE-SIMETH 200-200-20 MG/5ML PO SUSP: 30.0000 mL | Freq: Once | ORAL | Status: DC

## 2019-06-22 MED ORDER — SODIUM BICARBONATE 8.4 % IV SOLN
50.0000 meq | Freq: Once | INTRAVENOUS | Status: AC
  Administered 2019-06-22: 50 meq via INTRAVENOUS
  Filled 2019-06-22: qty 50

## 2019-06-22 MED ORDER — OXYCODONE-ACETAMINOPHEN 5-325 MG PO TABS
1.0000 | ORAL_TABLET | Freq: Four times a day (QID) | ORAL | Status: DC | PRN
  Administered 2019-06-22 – 2019-06-23 (×2): 1 via ORAL
  Filled 2019-06-22 (×2): qty 1

## 2019-06-22 MED ORDER — FAMOTIDINE 20 MG PO TABS
20.0000 mg | ORAL_TABLET | Freq: Once | ORAL | Status: AC
  Administered 2019-06-22: 20 mg via ORAL
  Filled 2019-06-22: qty 1

## 2019-06-22 MED ORDER — SODIUM BICARBONATE 650 MG PO TABS
650.0000 mg | ORAL_TABLET | Freq: Three times a day (TID) | ORAL | Status: DC
  Administered 2019-06-22 – 2019-06-24 (×6): 650 mg via ORAL
  Filled 2019-06-22 (×6): qty 1

## 2019-06-22 MED ORDER — TAMSULOSIN HCL 0.4 MG PO CAPS
0.4000 mg | ORAL_CAPSULE | Freq: Every day | ORAL | Status: DC
  Administered 2019-06-22 – 2019-06-24 (×3): 0.4 mg via ORAL
  Filled 2019-06-22 (×3): qty 1

## 2019-06-22 MED ORDER — PANTOPRAZOLE SODIUM 40 MG PO TBEC
40.0000 mg | DELAYED_RELEASE_TABLET | Freq: Two times a day (BID) | ORAL | Status: DC
  Administered 2019-06-22 – 2019-06-24 (×5): 40 mg via ORAL
  Filled 2019-06-22 (×5): qty 1

## 2019-06-22 MED ORDER — POTASSIUM CHLORIDE 10 MEQ/100ML IV SOLN
10.0000 meq | INTRAVENOUS | Status: AC
  Administered 2019-06-22 (×4): 10 meq via INTRAVENOUS
  Filled 2019-06-22 (×4): qty 100

## 2019-06-22 MED ORDER — ACETAMINOPHEN 325 MG PO TABS: 650.0000 mg | ORAL_TABLET | Freq: Four times a day (QID) | ORAL | Status: DC | PRN

## 2019-06-22 MED ORDER — POTASSIUM CHLORIDE IN NACL 40-0.9 MEQ/L-% IV SOLN
INTRAVENOUS | Status: AC
  Administered 2019-06-22 – 2019-06-23 (×2): 75 mL/h via INTRAVENOUS
  Filled 2019-06-22 (×2): qty 1000

## 2019-06-22 MED ORDER — POTASSIUM CHLORIDE CRYS ER 20 MEQ PO TBCR
40.0000 meq | EXTENDED_RELEASE_TABLET | Freq: Once | ORAL | Status: AC
  Administered 2019-06-22: 40 meq via ORAL
  Filled 2019-06-22: qty 2

## 2019-06-22 MED ORDER — SODIUM CHLORIDE 0.9 % IV SOLN
1000.0000 mL | INTRAVENOUS | Status: DC
  Administered 2019-06-22: 1000 mL via INTRAVENOUS

## 2019-06-22 MED ORDER — SODIUM CHLORIDE 0.9 % IV SOLN
2.0000 g | INTRAVENOUS | Status: DC
  Administered 2019-06-22 – 2019-06-23 (×2): 2 g via INTRAVENOUS
  Filled 2019-06-22 (×2): qty 20
  Filled 2019-06-22: qty 2

## 2019-06-22 MED ORDER — ONDANSETRON HCL 4 MG PO TABS: 4.0000 mg | ORAL_TABLET | Freq: Four times a day (QID) | ORAL | Status: DC | PRN

## 2019-06-22 MED ORDER — HEPARIN SODIUM (PORCINE) 5000 UNIT/ML IJ SOLN
5000.0000 [IU] | Freq: Three times a day (TID) | INTRAMUSCULAR | Status: DC
  Administered 2019-06-22 – 2019-06-24 (×6): 5000 [IU] via SUBCUTANEOUS
  Filled 2019-06-22 (×6): qty 1

## 2019-06-22 MED ORDER — SENNOSIDES-DOCUSATE SODIUM 8.6-50 MG PO TABS: 2.0000 | ORAL_TABLET | Freq: Every day | ORAL | Status: DC | PRN

## 2019-06-22 MED ORDER — OXYBUTYNIN CHLORIDE 5 MG PO TABS: 5.0000 mg | ORAL_TABLET | Freq: Three times a day (TID) | ORAL | Status: DC | PRN

## 2019-06-22 MED ORDER — ACETAMINOPHEN 650 MG RE SUPP: 650.0000 mg | Freq: Four times a day (QID) | RECTAL | Status: DC | PRN

## 2019-06-22 MED ORDER — BISACODYL 10 MG RE SUPP: 10.0000 mg | Freq: Every day | RECTAL | Status: DC | PRN

## 2019-06-22 MED ORDER — SODIUM CHLORIDE 0.9 % IV SOLN: 1.0000 g | Freq: Once | INTRAVENOUS | Status: DC

## 2019-06-22 MED ORDER — ONDANSETRON HCL 4 MG/2ML IJ SOLN: 4.0000 mg | Freq: Four times a day (QID) | INTRAMUSCULAR | Status: DC | PRN

## 2019-06-22 MED ORDER — SODIUM CHLORIDE 0.9 % IV SOLN: 1.0000 g | INTRAVENOUS | Status: DC

## 2019-06-22 NOTE — ED Notes (Signed)
Sheikh DO aware of pts K+

## 2019-06-22 NOTE — ED Notes (Signed)
Pt provided with diet gingerale and cheese stick

## 2019-06-22 NOTE — ED Provider Notes (Addendum)
[facility] DEPT Provider Note   CSN: [id number] Arrival date & time: 06/22/19  1607     History No chief complaint on file.   Adam Weaver is a 33 y.o. male.  Pt complains of potassium being low. Pt reports he has a history of low potassium.  Pt states he is weak and can barely move.  Pt reports he feels like he does when his potassium is low. 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 Pt was seen yesterday for a infected kidney stone and had a stent placed.  Pt denies fever or chills. No nausea or vomiting. No covid exposure   The history is provided by the patient. No language interpreter was used.       Past Medical History:  Diagnosis Date  . Hypokalemia   . Hypokalemic periodic paralysis   . Kidney stones   . Renal disorder     Patient Active Problem List   Diagnosis Date Noted  . Bronchitis   . 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 Hypokalemia 12/05/2017  . ARF (acute renal failure) ([place]) 12/05/2017  . Leucocytosis 12/05/2017  . Dehydration 12/05/2017    Past Surgical History:  Procedure Laterality Date  . CYSTOSCOPY W/ URETERAL STENT PLACEMENT Right 06/21/2019   Procedure: CYSTOSCOPY WITH RIGHT URETERAL STENT PLACEMENT;  Surgeon: Robley Fries, MD;  Location: [facility] ORS;  Service: Urology;  Laterality: Right;  . LITHOTRIPSY         History reviewed. No pertinent family history.  Social History   Tobacco Use  . Smoking status: Current Every Day Smoker    Types: Cigarettes  . Smokeless tobacco: Never Used  Substance Use Topics  . Alcohol use: Never  . Drug use: Never    Home Medications Prior to Admission medications   Medication Sig Start Date End Date Taking? Authorizing Provider  Potassium 99 MG TABS Take 891 mg by mouth 2 (two) times daily.    Yes [provider]  cephALEXin (KEFLEX) 500 MG capsule Take 1 capsule (500 mg total) by  mouth 4 (four) times daily for 10 days. 06/21/19 07/01/19  Robley Fries, MD  oxybutynin (DITROPAN) 5 MG tablet Take 1 tablet (5 mg total) by mouth every 8 (eight) hours as needed for bladder spasms. 06/21/19   Robley Fries, MD  oxyCODONE-acetaminophen (PERCOCET) 5-325 MG tablet Take 1 tablet by mouth every 6 (six) hours as needed for severe pain (following stent placement for kidney stone). 06/21/19 06/20/20  Robley Fries, MD  senna-docusate (SENOKOT-S) 8.6-50 MG tablet Take 2 tablets by mouth daily as needed for mild constipation. 06/21/19 06/20/20  Robley Fries, MD  tamsulosin (FLOMAX) 0.4 MG CAPS capsule Take 1 capsule (0.4 mg total) by mouth daily. 06/21/19 07/21/19  Robley Fries, MD    Allergies    Zithromax [azithromycin] and Ciprofloxacin  Review of Systems   Review of Systems  All other systems reviewed and are negative.   Physical Exam Updated Vital Signs BP 132/83 (BP Location: Left Arm)   Pulse 70   Temp 98.4 F (36.9 C) (Oral)   Resp 18   SpO2 97%   Physical Exam Vitals and nursing note reviewed.  Constitutional:      Appearance: He is well-developed.  HENT:     Head: Normocephalic.  Cardiovascular:     Rate and Rhythm: Normal rate.  Pulmonary:     Effort: Pulmonary effort is normal.  Abdominal:  General: There is no distension.  Musculoskeletal:        General: Normal range of motion.     Cervical back: Normal range of motion.  Skin:    General: Skin is warm.  Neurological:     Mental Status: He is alert and oriented to person, place, and time.  Psychiatric:        Mood and Affect: Mood normal.     ED Results / Procedures / Treatments   Labs (all labs ordered are listed, but only abnormal results are displayed) Labs Reviewed  CBC WITH DIFFERENTIAL/PLATELET - Abnormal; Notable for the following components:      Result Value   WBC 13.8 (*)    Hemoglobin 17.2 (*)    Neutro Abs 12.4 (*)    All other components within normal  limits  COMPREHENSIVE METABOLIC PANEL - Abnormal; Notable for the following components:   Potassium 2.2 (*)    Chloride 117 (*)    CO2 11 (*)    Glucose, Bld 139 (*)    BUN 22 (*)    Creatinine, Ser 2.22 (*)    AST 14 (*)    GFR calc non Af Amer 38 (*)    GFR calc Af Amer 44 (*)    All other components within normal limits    EKG None  Radiology DG C-Arm 1-60 Min-No Report  Result Date: 06/21/2019 Fluoroscopy was utilized by the requesting physician.  No radiographic interpretation.   CT Renal Stone Study  Result Date: 06/21/2019 CLINICAL DATA:  Right flank pain. Increased urine output for 2 days. History of renal calculi. EXAM: CT ABDOMEN AND PELVIS WITHOUT CONTRAST TECHNIQUE: Multidetector CT imaging of the abdomen and pelvis was performed following the standard protocol without IV contrast. COMPARISON:  12/25/2017 FINDINGS: Lower chest: Unremarkable Hepatobiliary: Unremarkable Pancreas: Unremarkable Spleen: Unremarkable Adrenals/Urinary Tract: Both adrenal glands appear normal. Innumerable bilateral renal calculi noted with possible medullary nephrocalcinosis contributing to the appearance of dense clustered calcifications bilaterally. Moderate to prominent right hydronephrosis is present extending down to a 1.3 cm in long axis right proximal ureteral stone shown on image 60/5. Right ureter distal to this stone is of normal caliber. No left hydronephrosis. A fairly homogeneous 1.6 by 1.6 cm hyperdense exophytic lesion of the left kidney upper pole on image 39/2 is present and measured 1.6 by 1.4 cm on 12/25/2017. This is most likely a complex cyst although its enhancement characteristics are not assessed. A left kidney lower pole lesion measuring 2.9 by 2.6 by 2.5 cm on image 67/5 is present, this previously measured 2.7 by 2.4 by 2.3 cm. This lesion has fluid density internally but some subtle heterogeneity could have mild complexity. Other hypodense lesions of the left kidney are  technically too small to characterize. Urinary bladder unremarkable. Stomach/Bowel: Unremarkable Vascular/Lymphatic: Unremarkable Reproductive: Unremarkable Other: No supplemental non-categorized findings. Musculoskeletal: Unremarkable IMPRESSION: 1. Moderate to prominent right hydronephrosis due to a 1.3 cm in long axis right proximal ureteral stone. 2. 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 3. Left kidney lower pole lesion has enlarged compared to 12/25/2017 and is probably a complex cyst. Another clearly complex lesion of the left kidney upper pole is homogeneous and probably a cyst. The enhancement characteristics of these lesions are not assessed; if further workup for a renal mass is warranted, possibilities might include ultrasound or contrast enhanced cross-sectional imaging in the nonacute setting. Electronically Signed   By: Gaylyn Rong M.D.   On: 06/21/2019 13:19    Procedures .  Critical Care Performed by: Elson AreasSofia, Mollie Rossano K, PA-C Authorized by: Elson AreasSofia, Rebie Peale K, PA-C   Critical care provider statement:    Critical care time (minutes):  45   Critical care time was exclusive of:  Separately billable procedures and treating other patients   Critical care was necessary to treat or prevent imminent or life-threatening deterioration of the following conditions:  Cardiac failure, circulatory failure and metabolic crisis   Critical care was time spent personally by me on the following activities:  Discussions with consultants, evaluation of patient's response to treatment, examination of patient, ordering and performing treatments and interventions, ordering and review of laboratory studies, ordering and review of radiographic studies, pulse oximetry, re-evaluation of patient's condition, obtaining history from patient or surrogate and review of old charts   I assumed direction of critical care for this patient from another provider in my specialty: no      (including critical care time)  Medications Ordered in ED Medications  potassium chloride SA (KLOR-CON) CR tablet 40 mEq (has no administration in time range)  potassium chloride 10 mEq in 100 mL IVPB (has no administration in time range)  famotidine (PEPCID) tablet 20 mg (20 mg Oral Given 06/22/19 [id number])    ED Course  I have reviewed the triage vital signs and the nursing notes.  Pertinent labs & imaging results that were available during my care of the patient were reviewed by me and considered in my medical decision making (see chart for details).    MDM Rules/Calculators/A&P                      MDM: Pt given po potassium and iv potassium.  Pt given IV fluids, bicarb and Rocephin.   Hospitalist consulted and will see here.  Final Clinical Impression(s) / ED Diagnoses Final diagnoses:  Hypokalemia    Rx / DC Orders ED Discharge Orders    None       Osie CheeksSofia, Ethelreda Sukhu K, PA-C 06/22/19 [id number]    Lorre NickAllen, Anthony, MD 06/24/19 0801    Elson AreasSofia, Ottie Tillery K, PA-C 08/22/19 1327    Lorre NickAllen, Anthony, MD 08/23/19 1215

## 2019-06-22 NOTE — ED Notes (Signed)
Pharmacy called for [employer]

## 2019-06-22 NOTE — Progress Notes (Signed)
Found patients slippers in ER department. Will call tomorrow to let patient  Know he can come get them.

## 2019-06-22 NOTE — H&P (Signed)
History and Physical    Adam Weaver LPF:[id number] DOB: Jun 16, 1986 DOA: 06/22/2019  PCP: Patient, No Pcp Per   Patient coming from: Home  Chief Complaint: Generalized Weaness; Arm paralysis   HPI: Adam Weaver is a 33 y.o. male with medical history significant for but not limited too obesity, hyperkalemia, familial hypokalemic periodic paralysis, chronic kidney disease stage III, nephrolithiasis with recent evaluation by urology for an obstructing 1.3 cm calculus in the right UPJ status post stent placement on 06/21/2019, as well as other comorbidities who presents with chief complaint of generalized weakness with then inability to move some of his extremities.  Patient was evaluated yesterday for a right renal stone that required intervention with a cystoscopy and right ureteral stent placement.  He was discharged by the urologist home on Keflex and presents again today after he got up this morning to use the restroom and felt severely weak.  Patient has periods of bradycardia becomes paralyzed and weak when his potassium drops and he felt like his potassium was severely low.  He asked his fiance to call the EMS to bring him to the hospital.  He complains of weakness and states that his flank pain is improved from yesterday but also is complaining of indigestion and reflux.  Denies chest pain, lightheadedness or dizziness.  Frustrated that he cannot get onto Medicaid.  Denies any other concerns or complaints at this time and [facility] was asked to admit this patient for severe hyperkalemia and hypokalemic periodic paralysis.  ED Course: In the ED he had basic blood work done including a CMP and a CBC and also was given potassium replacement with IV and p.o. potassium chloride as well as amp of sodium bicarbonate and placed on IV fluid hydration.  Of note patient tested negative for SARS coronavirus 2 yesterday but requires testing today given hospital protocol for admission.  Review of Systems: As per  HPI otherwise all other systems reviewed and negative.   Past Medical History:  Diagnosis Date  . Hypokalemia   . Hypokalemic periodic paralysis   . Kidney stones   . Renal disorder    Past Surgical History:  Procedure Laterality Date  . CYSTOSCOPY W/ URETERAL STENT PLACEMENT Right 06/21/2019   Procedure: CYSTOSCOPY WITH RIGHT URETERAL STENT PLACEMENT;  Surgeon: Noel Christmas, MD;  Location: [facility];  Service: Urology;  Laterality: Right;  . LITHOTRIPSY     SOCIAL HISTORY  reports that he has been smoking cigarettes. He has never used smokeless tobacco. He reports that he does not drink alcohol or use drugs.  Allergies  Allergen Reactions  . Zithromax [Azithromycin] Anaphylaxis  . Ciprofloxacin Hives   FAMILY HISTORY Patient's Mother had Diabetes and HTN Father is Relatively Healthy per patient   Prior to Admission medications   Medication Sig Start Date End Date Taking? Authorizing Provider  Potassium 99 MG TABS Take 891 mg by mouth 2 (two) times daily.    Yes [provider]  cephALEXin (KEFLEX) 500 MG capsule Take 1 capsule (500 mg total) by mouth 4 (four) times daily for 10 days. 06/21/19 07/01/19  Noel Christmas, MD  oxybutynin (DITROPAN) 5 MG tablet Take 1 tablet (5 mg total) by mouth every 8 (eight) hours as needed for bladder spasms. 06/21/19   Noel Christmas, MD  oxyCODONE-acetaminophen (PERCOCET) 5-325 MG tablet Take 1 tablet by mouth every 6 (six) hours as needed for severe pain (following stent placement for kidney stone). 06/21/19 06/20/20  Noel Christmas, MD  senna-docusate (  SENOKOT-S) 8.6-50 MG tablet Take 2 tablets by mouth daily as needed for mild constipation. 06/21/19 06/20/20  Noel ChristmasPace, Maryellen D, MD  tamsulosin (FLOMAX) 0.4 MG CAPS capsule Take 1 capsule (0.4 mg total) by mouth daily. 06/21/19 07/21/19  Noel ChristmasPace, Maryellen D, MD   Physical Exam: Vitals:   06/22/19 [id number] 06/22/19 0647 06/22/19 0914 06/22/19 0942  BP:  132/83  112/84  Pulse:  70  70 70  Resp:  18 20 18   Temp:  98.4 F (36.9 C)    TempSrc:  Oral    SpO2: 96% 97% 96% 97%   Constitutional: WN/WD obese Caucasian male in NAD but appears frustrated  Eyes: Lids and conjunctivae normal, sclerae anicteric  ENMT: External Ears, Nose appear normal. Grossly normal hearing.  Neck: Appears normal, supple, no cervical masses, normal ROM, no appreciable thyromegaly; no JVD Respiratory: Diminished to auscultation bilaterally, no wheezing, rales, rhonchi or crackles. Normal respiratory effort and patient is not tachypenic. No accessory muscle use.  Cardiovascular: RRR, no murmurs / rubs / gallops. S1 and S2 auscultated. No extremity edema.  Abdomen: Soft, non-tender, Distended. Bowel sounds positive x4.  GU: Deferred. Musculoskeletal: No clubbing / cyanosis of digits/nails. No joint deformity upper and lower extremities.  Skin: No rashes, lesions, ulcers on a limited skin evaluation. No induration; Warm and dry.  Neurologic: CN 2-12 grossly intact with no focal deficits. Has a very difficult time lifting left arm and right arm he can only lift somewhat an hand is paralyzed.  Psychiatric: Normal judgment and insight. Alert and oriented x 3. Slightly agitated mood and appropriate affect.   Labs on Admission: I have personally reviewed following labs and imaging studies  CBC: Recent Labs  Lab 06/21/19 1225 06/22/19 0652  WBC 14.0* 13.8*  NEUTROABS 11.8* 12.4*  HGB 16.7 17.2*  HCT 49.1 51.9  MCV 88.8 91.7  PLT 227 238   Basic Metabolic Panel: Recent Labs  Lab 06/21/19 1225 06/22/19 0652  NA 138 139  K 2.6* 2.2*  CL 111 117*  CO2 18* 11*  GLUCOSE 102* 139*  BUN 19 22*  CREATININE 2.46* 2.22*  CALCIUM 8.4* 8.9  MG  --  2.5*   GFR: Estimated Creatinine Clearance: 61.8 mL/min (A) (by C-G formula based on SCr of 2.22 mg/dL (H)). Liver Function Tests: Recent Labs  Lab 06/21/19 1225 06/22/19 0652  AST 17 14*  ALT 25 23  ALKPHOS 105 90  BILITOT 1.1 1.0  PROT 7.0  7.2  ALBUMIN 4.2 4.1   Recent Labs  Lab 06/21/19 1225  LIPASE 34   No results for input(s): AMMONIA in the last 168 hours. Coagulation Profile: No results for input(s): INR, PROTIME in the last 168 hours. Cardiac Enzymes: No results for input(s): CKTOTAL, CKMB, CKMBINDEX, TROPONINI in the last 168 hours. BNP (last 3 results) No results for input(s): PROBNP in the last 8760 hours. HbA1C: No results for input(s): HGBA1C in the last 72 hours. CBG: No results for input(s): GLUCAP in the last 168 hours. Lipid Profile: No results for input(s): CHOL, HDL, LDLCALC, TRIG, CHOLHDL, LDLDIRECT in the last 72 hours. Thyroid Function Tests: No results for input(s): TSH, T4TOTAL, FREET4, T3FREE, THYROIDAB in the last 72 hours. Anemia Panel: No results for input(s): VITAMINB12, FOLATE, FERRITIN, TIBC, IRON, RETICCTPCT in the last 72 hours. Urine analysis:    Component Value Date/Time   COLORURINE YELLOW 06/21/2019 1201   APPEARANCEUR CLOUDY (A) 06/21/2019 1201   LABSPEC 1.010 06/21/2019 1201   PHURINE 7.5 06/21/2019 1201  GLUCOSEU NEGATIVE 06/21/2019 1201   HGBUR LARGE (A) 06/21/2019 1201   BILIRUBINUR NEGATIVE 06/21/2019 1201   KETONESUR NEGATIVE 06/21/2019 1201   PROTEINUR NEGATIVE 06/21/2019 1201   NITRITE NEGATIVE 06/21/2019 1201   LEUKOCYTESUR LARGE (A) 06/21/2019 1201   Sepsis Labs: !!!!!!!!!!!!!!!!!!!!!!!!!!!!!!!!!!!!!!!!!!!! (procalcitonin:4,lacticidven:4) ) Recent Results (from the past 240 hour(s))  Respiratory Panel by RT PCR (Flu A&B, Covid) - Nasopharyngeal Swab     Status: None   Collection Time: 06/21/19  2:00 PM   Specimen: Nasopharyngeal Swab  Result Value Ref Range Status   SARS Coronavirus 2 by RT PCR NEGATIVE NEGATIVE Final    Comment: (NOTE) SARS-CoV-2 target nucleic acids are NOT DETECTED. The SARS-CoV-2 RNA is generally detectable in upper respiratoy specimens during the acute phase of infection. The lowest concentration of SARS-CoV-2 viral  copies this assay can detect is 131 copies/mL. A negative result does not preclude SARS-Cov-2 infection and should not be used as the sole basis for treatment or other patient management decisions. A negative result may occur with  improper specimen collection/handling, submission of specimen other than nasopharyngeal swab, presence of viral mutation(s) within the areas targeted by this assay, and inadequate number of viral copies (<131 copies/mL). A negative result must be combined with clinical observations, patient history, and epidemiological information. The expected result is Negative. Fact Sheet for Patients:  https://www.moore.com/ Fact Sheet for Healthcare Providers:  https://www.young.biz/ This test is not yet ap proved or cleared by the [place] FDA and  has been authorized for detection and/or diagnosis of SARS-CoV-2 by FDA under an Emergency Use Authorization (EUA). This EUA will remain  in effect (meaning this test can be used) for the duration of the COVID-19 declaration under Section 564(b)(1) of the Act, 21 U.S.C. section 360bbb-3(b)(1), unless the authorization is terminated or revoked sooner.    Influenza A by PCR NEGATIVE NEGATIVE Final   Influenza B by PCR NEGATIVE NEGATIVE Final    Comment: (NOTE) The Xpert Xpress SARS-CoV-2/FLU/RSV assay is intended as an aid in  the diagnosis of influenza from Nasopharyngeal swab specimens and  should not be used as a sole basis for treatment. Nasal washings and  aspirates are unacceptable for Xpert Xpress SARS-CoV-2/FLU/RSV  testing. Fact Sheet for Patients: https://www.moore.com/ Fact Sheet for Healthcare Providers: https://www.young.biz/ This test is not yet approved or cleared by the [place] FDA and  has been authorized for detection and/or diagnosis of SARS-CoV-2 by  FDA under an Emergency Use Authorization (EUA). This EUA will  remain  in effect (meaning this test can be used) for the duration of the  Covid-19 declaration under Section 564(b)(1) of the Act, 21  U.S.C. section 360bbb-3(b)(1), unless the authorization is  terminated or revoked. Performed at [facility], [address], [place], [place] [address]     Radiological Exams on Admission: DG C-Arm 1-60 Min-No Report  Result Date: 06/21/2019 Fluoroscopy was utilized by the requesting physician.  No radiographic interpretation.   CT Renal Stone Study  Result Date: 06/21/2019 CLINICAL DATA:  Right flank pain. Increased urine output for 2 days. History of renal calculi. EXAM: CT ABDOMEN AND PELVIS WITHOUT CONTRAST TECHNIQUE: Multidetector CT imaging of the abdomen and pelvis was performed following the standard protocol without IV contrast. COMPARISON:  12/25/2017 FINDINGS: Lower chest: Unremarkable Hepatobiliary: Unremarkable Pancreas: Unremarkable Spleen: Unremarkable Adrenals/Urinary Tract: Both adrenal glands appear normal. Innumerable bilateral renal calculi noted with possible medullary nephrocalcinosis contributing to the appearance of dense clustered calcifications bilaterally. Moderate to prominent right hydronephrosis is  present extending down to a 1.3 cm in long axis right proximal ureteral stone shown on image 60/5. Right ureter distal to this stone is of normal caliber. No left hydronephrosis. A fairly homogeneous 1.6 by 1.6 cm hyperdense exophytic lesion of the left kidney upper pole on image 39/2 is present and measured 1.6 by 1.4 cm on 12/25/2017. This is most likely a complex cyst although its enhancement characteristics are not assessed. A left kidney lower pole lesion measuring 2.9 by 2.6 by 2.5 cm on image 67/5 is present, this previously measured 2.7 by 2.4 by 2.3 cm. This lesion has fluid density internally but some subtle heterogeneity could have mild complexity. Other hypodense lesions of the left kidney are technically too small to  characterize. Urinary bladder unremarkable. Stomach/Bowel: Unremarkable Vascular/Lymphatic: Unremarkable Reproductive: Unremarkable Other: No supplemental non-categorized findings. Musculoskeletal: Unremarkable IMPRESSION: 1. Moderate to prominent right hydronephrosis due to a 1.3 cm in long axis right proximal ureteral stone. 2. 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 3. Left kidney lower pole lesion has enlarged compared to 12/25/2017 and is probably a complex cyst. Another clearly complex lesion of the left kidney upper pole is homogeneous and probably a cyst. The enhancement characteristics of these lesions are not assessed; if further workup for a renal mass is warranted, possibilities might include ultrasound or contrast enhanced cross-sectional imaging in the nonacute setting. Electronically Signed   By: Van Clines M.D.   On: 06/21/2019 13:19   EKG: No EKG was done on arrival to the emergency room so we will order one now  Assessment/Plan Active Problems:   Hypokalemia   Leucocytosis   Hypokalemic periodic paralysis   Familial hypokalemic periodic paralysis   Acute kidney injury superimposed on CKD ([place])   Acute lower UTI   Right nephrolithiasis  Severe Hypokalemia Hypokalemic Periodic Paralysis -Yesterday patient had a calcium of 2.6 and this morning was 2.2 and he was significantly weak and unable to move his left arm and barely move his right arm -Place in observation telemetry given his low potassium-replete and patient was given p.o. potassium chloride 40 mEq and was given IV KCl 60 mEq runs.  We will place the patient on normal saline +40 mEq of KCl per 75 mL's per hour-magnesium level was 2.5X-continue to monitor and replete as necessary -Check TSH as well -Repeat CMP in a.m. -If patient continues to still have weakness and paralysis will obtain PT and OT to evaluate and will consider further brain imaging  UTI, poA in the setting of  obstructing right UPJ calculus -Yesterday was febrile, tacycardic, and tachypenic and was placed on Cefalexain after his stent placement -WBC was 14,000 on admission today is 13,800 -was found to have a 1.3 cm right ureteral calculus -Urinalysis yesterday showed a cloudy appearance with large hemoglobin, large leukocytes, negative nitrites, few bacteria, 21-50 RBCs per high-power field, and greater than 50 WBCs with urine culture data still pending next-if placed on empiric cephalexin we will continue IV ceftriaxone while he is hospitalized -Continue to monitor and follow culture data and obtain blood cultures x2  Obstructing Nephrolithiasis -He is status post diascopy and a right ureteral stent placement yesterday -Is to undergo lithotripsy in 3 weeks  AKI on CKD Stage 3 Metabolic Acidosis -Patient's Baseline Cr is around 1.6-1.8 -Presented with a Cr of 2.46 yesterday and today BUN/Cr is 22/2.22 -Given IVF Hydraton with 2 NS boluses; EDP started maintenance at 125 mL/hr but will reduce to 75 mL/hr and add 40 mEQ of  KCl  -Given 1 Amp of Bicarbonate and will start the patient on po Sodium Bicarbonate 650 mg po TID -Avoid nephrotoxic medications, contrast dyes as well as hypotension and renally dose medications -Continue to monitor and trend renal function -Repeat CMP in a.m.  Obesity -Estimated body mass index is 35.57 kg/m as calculated from the following:   Height as of 06/21/19:  (1.803 m).   Weight as of 06/21/19: 115.7 kg. -Weight Loss and Dietary Counseling given  Erythrocytosis -Likely in the setting of hemoconcentration  -Patient's hemoglobin/hematocrit went from 16.7/49.1 and now is 17.2/51.9 -C/w IVF Hydration as above -Continue to Monitor and Trend -Repeat CBC in AM   Hyperglycemia -Likely in the setting of infection -Rule out diabetes component given his body habitus-patient blood glucose yesterday was 102 on CMP and this morning is 139 -Continue to monitor and  trend blood sugars carefully and if necessary will place on sensitive NovoLog/scale insulin  DVT prophylaxis: Heparin 5,000 units subcutaneous q8h Code Status: FULL CODE  Family Communication: No family present at bedside  Disposition Plan: Pending Clinical Improvement of his Paralysis and Weakness with repletement of his K+  Consults called: None Admission status: Observation Telemetry   Severity of Illness: The appropriate patient status for this patient is OBSERVATION. Observation status is judged to be reasonable and necessary in order to provide the required intensity of service to ensure the patient's safety. The patient's presenting symptoms, physical exam findings, and initial radiographic and laboratory data in the context of their medical condition is felt to place them at decreased risk for further clinical deterioration. Furthermore, it is anticipated that the patient will be medically stable for discharge from the hospital within 2 midnights of admission. The following factors support the patient status of observation.   " The patient's presenting symptoms include Weakness. " The physical exam findings include Arm parlaysis and decreased ROM " The initial radiographic and laboratory data are concerning for Severe Hypokalemia and AKI on CKD  Merlene Laughter, D.O. [facility] PAGER is on AMION  If 7PM-7AM, please contact night-coverage [email or web address]  06/22/2019, 10:24 AM    \

## 2019-06-22 NOTE — ED Triage Notes (Signed)
Pt arriving via [facility] for increased weakness and low potassium. Pt reports he had a stint placed yesterday. Stage 3 kidney disease. Pt A&O x4 upon arrival

## 2019-06-22 NOTE — ED Notes (Signed)
CRITICAL VALUE ALERT  Critical Value:  K+ 2.21mmol/L  Date & Time Notied:  06/22/2019 5929  Provider Notified: Sophia PA  Orders Received/Actions taken: PA aware

## 2019-06-22 NOTE — ED Notes (Addendum)
Pt  Placed  On cardiac monitor in hall

## 2019-06-22 NOTE — ED Notes (Signed)
While drawing pts blood, Pt states "After you take this blood can you go ahead and give me my potassium in my IV so my heart doesn't stop? I know it is low. Just give it to me." Pt informed that he must for the results of the labs before any potassium can be administered. Pt displeased.

## 2019-06-23 LAB — BASIC METABOLIC PANEL
Anion gap: 9 (ref 5–15)
BUN: 27 mg/dL — ABNORMAL HIGH (ref 6–20)
CO2: 14 mmol/L — ABNORMAL LOW (ref 22–32)
Calcium: 8.3 mg/dL — ABNORMAL LOW (ref 8.9–10.3)
Chloride: 121 mmol/L — ABNORMAL HIGH (ref 98–111)
Creatinine, Ser: 1.99 mg/dL — ABNORMAL HIGH (ref 0.61–1.24)
GFR calc Af Amer: 50 mL/min — ABNORMAL LOW (ref >60)
GFR calc non Af Amer: 43 mL/min — ABNORMAL LOW (ref >60)
Glucose, Bld: 98 mg/dL (ref 70–99)
Potassium: 3.4 mmol/L — ABNORMAL LOW (ref 3.5–5.1)
Sodium: 144 mmol/L (ref 135–145)

## 2019-06-23 LAB — COMPREHENSIVE METABOLIC PANEL
ALT: 18 U/L (ref 0–44)
AST: 17 U/L (ref 15–41)
Albumin: 3.1 g/dL — ABNORMAL LOW (ref 3.5–5.0)
Alkaline Phosphatase: 67 U/L (ref 38–126)
Anion gap: 9 (ref 5–15)
BUN: 25 mg/dL — ABNORMAL HIGH (ref 6–20)
CO2: 10 mmol/L — ABNORMAL LOW (ref 22–32)
Calcium: 8.4 mg/dL — ABNORMAL LOW (ref 8.9–10.3)
Chloride: 124 mmol/L — ABNORMAL HIGH (ref 98–111)
Creatinine, Ser: 2.02 mg/dL — ABNORMAL HIGH (ref 0.61–1.24)
GFR calc Af Amer: 49 mL/min — ABNORMAL LOW (ref >60)
GFR calc non Af Amer: 42 mL/min — ABNORMAL LOW (ref >60)
Glucose, Bld: 106 mg/dL — ABNORMAL HIGH (ref 70–99)
Potassium: 2.3 mmol/L — CL (ref 3.5–5.1)
Sodium: 143 mmol/L (ref 135–145)
Total Bilirubin: 0.9 mg/dL (ref 0.3–1.2)
Total Protein: 5.9 g/dL — ABNORMAL LOW (ref 6.5–8.1)

## 2019-06-23 LAB — CBC WITH DIFFERENTIAL/PLATELET
Abs Immature Granulocytes: 0.08 10*3/uL — ABNORMAL HIGH (ref 0.00–0.07)
Basophils Absolute: 0.1 10*3/uL (ref 0.0–0.1)
Basophils Relative: 0 %
Eosinophils Absolute: 0.2 10*3/uL (ref 0.0–0.5)
Eosinophils Relative: 1 %
HCT: 45 % (ref 39.0–52.0)
Hemoglobin: 14.5 g/dL (ref 13.0–17.0)
Immature Granulocytes: 1 %
Lymphocytes Relative: 14 %
Lymphs Abs: 1.9 10*3/uL (ref 0.7–4.0)
MCH: 29.8 pg (ref 26.0–34.0)
MCHC: 32.2 g/dL (ref 30.0–36.0)
MCV: 92.6 fL (ref 80.0–100.0)
Monocytes Absolute: 1 10*3/uL (ref 0.1–1.0)
Monocytes Relative: 7 %
Neutro Abs: 10.6 10*3/uL — ABNORMAL HIGH (ref 1.7–7.7)
Neutrophils Relative %: 77 %
Platelets: 221 10*3/uL (ref 150–400)
RBC: 4.86 MIL/uL (ref 4.22–5.81)
RDW: 15.1 % (ref 11.5–15.5)
WBC: 13.7 10*3/uL — ABNORMAL HIGH (ref 4.0–10.5)
nRBC: 0 % (ref 0.0–0.2)

## 2019-06-23 LAB — URINE CULTURE: Culture: NO GROWTH

## 2019-06-23 LAB — GLUCOSE, CAPILLARY: Glucose-Capillary: 91 mg/dL (ref 70–99)

## 2019-06-23 LAB — HEMOGLOBIN A1C
Hgb A1c MFr Bld: 4.7 % — ABNORMAL LOW (ref 4.8–5.6)
Mean Plasma Glucose: 88.19 mg/dL

## 2019-06-23 LAB — MAGNESIUM: Magnesium: 2.1 mg/dL (ref 1.7–2.4)

## 2019-06-23 LAB — PHOSPHORUS: Phosphorus: 2.7 mg/dL (ref 2.5–4.6)

## 2019-06-23 MED ORDER — MELATONIN 3 MG PO TABS
3.0000 mg | ORAL_TABLET | Freq: Every day | ORAL | Status: DC
  Administered 2019-06-23: 3 mg via ORAL
  Filled 2019-06-23: qty 1

## 2019-06-23 MED ORDER — POTASSIUM CHLORIDE 10 MEQ/100ML IV SOLN
10.0000 meq | INTRAVENOUS | Status: AC
  Administered 2019-06-23 (×4): 10 meq via INTRAVENOUS
  Administered 2019-06-23: 0 meq via INTRAVENOUS
  Administered 2019-06-23: 10 meq via INTRAVENOUS
  Filled 2019-06-23 (×6): qty 100

## 2019-06-23 NOTE — Progress Notes (Signed)
Patient admitted to [facility]. Alert/oriented x 4. Patient very weak and unable to stand to transfer to bed. Slight pain. K pad applied. No other complaints. IV K+ running. Tele on. Skin intact. Patient oriented to room and staff. No questions. Will continue to monitor.

## 2019-06-23 NOTE — Progress Notes (Signed)
CRITICAL VALUE STICKER  CRITICAL VALUE: K 2.3  RECEIVER (on-site recipient of call):  DATE & TIME NOTIFIED:  06/23/19 @ 0705  MESSENGER (representative from lab):  MD NOTIFIED:   E[place]  TIME OF NOTIFICATION: 0705  RESPONSE: Awaiting

## 2019-06-23 NOTE — Progress Notes (Signed)
PROGRESS NOTE  Rathana Viveros NOM:[id number] DOB: 1985/07/06 DOA: 06/22/2019 PCP: Patient, No Pcp Per  HPI/Recap of past 24 hours: HPI from Dr Alfredia Ferguson Peretz Thieme is a 33 y.o. male with medical history significant for obesity, familial hypokalemic periodic paralysis, chronic kidney disease stage III, nephrolithiasis with recent evaluation by urology for an obstructing 1.3 cm calculus in the right UPJ status post stent placement on 06/21/2019, presents with chief complaint of generalized weakness, inability to move some of his extremities. Patient has periods of bradycardia becomes paralyzed and weak when his potassium drops and he felt like his potassium was severely low. [facility] was asked to admit this patient for severe hypokalemia in the setting of hypokalemic periodic paralysis.    Today, patient still reports generalized weakness, worse in right upper extremity and left lower extremity, and some generalized body aches. Patient denies any other new complaints, denies any chest pain, abdominal pain, nausea/vomiting, fever/chills.  Assessment/Plan: Active Problems:   Hypokalemia   Leucocytosis   Hypokalemic periodic paralysis   Familial hypokalemic periodic paralysis   Acute kidney injury superimposed on CKD (HCC)   Acute lower UTI   Right nephrolithiasis   Obesity (BMI 30-39.9)   Erythrocytosis   Hyperglycemia  Severe hypokalemia in the setting of hypokalemic periodic paralysis Ongoing potassium replacement Frequent BMP Patient stated a sinus potassium is replenished his paralysis improved significantly Currently no need for PT/OT for now  UTI in the setting of obstructing right UPJ calculus Status post cystoscopy and right ureteral stent placement on 06/21/2019 by urology Currently afebrile, with leukocytosis UA with large hemoglobin, large leukocytes, few bacteria, greater than 50 WBC, negative nitrites UC pending Switch home Keflex to IV ceftriaxone, continue Follow-up  with urology for lithotripsy in 3 weeks  CKD stage IIIa Creatinine baseline around 1.8-2.0 Status post IV fluids Started on p.o. sodium bicarb 3 times daily, will continue for now Daily BMP  GERD Continue PPI  Obesity Lifestyle modification advised        Malnutrition Type:      Malnutrition Characteristics:      Nutrition Interventions:       Estimated body mass index is 37.39 kg/m as calculated from the following:   Height as of 06/21/19: 5\' 11"  (1.803 m).   Weight as of this encounter: 121.6 kg.     Code Status: Full  Family Communication: None at bedside  Disposition Plan: To be determined   Consultants:  None  Procedures:  None  Antimicrobials:  Ceftriaxone  DVT prophylaxis: Heparin   Objective: Vitals:   06/22/19 2330 06/23/19 0015 06/23/19 0123 06/23/19 0533  BP: 117/73 130/84  115/72  Pulse: 72 69  71  Resp: 16 17  17   Temp:  98 F (36.7 C)  97.7 F (36.5 C)  TempSrc:  Oral  Oral  SpO2: 93% 98%  95%  Weight:   121.6 kg     Intake/Output Summary (Last 24 hours) at 06/23/2019 1256 Last data filed at 06/23/2019 9628 Gross per 24 hour  Intake 1552.47 ml  Output 5100 ml  Net -3547.53 ml   Filed Weights   06/23/19 0123  Weight: 121.6 kg    Exam:  General: NAD   Cardiovascular: S1, S2 present  Respiratory: CTAB  Abdomen: Soft, nontender, nondistended, bowel sounds present  Musculoskeletal: No bilateral pedal edema noted  Skin: Normal  Neurology: Able to lift right arm and left leg, but still somewhat difficult, other extremities with normal strength  Psychiatry: Normal mood   Data  Reviewed: CBC: Recent Labs  Lab 06/21/19 1225 06/22/19 0652 06/23/19 0605  WBC 14.0* 13.8* 13.7*  NEUTROABS 11.8* 12.4* 10.6*  HGB 16.7 17.2* 14.5  HCT 49.1 51.9 45.0  MCV 88.8 91.7 92.6  PLT 227 238 221   Basic Metabolic Panel: Recent Labs  Lab 06/21/19 1225 06/22/19 0652 06/22/19 1728 06/23/19 0605  NA 138 139  141 143  K 2.6* 2.2* 2.6* 2.3*  CL 111 117* 118* 124*  CO2 18* 11* 12* 10*  GLUCOSE 102* 139* 102* 106*  BUN 19 22* 24* 25*  CREATININE 2.46* 2.22* 1.96* 2.02*  CALCIUM 8.4* 8.9 8.2* 8.4*  MG  --  2.5*  --  2.1  PHOS  --   --   --  2.7   GFR: Estimated Creatinine Clearance: 69.7 mL/min (A) (by C-G formula based on SCr of 2.02 mg/dL (H)). Liver Function Tests: Recent Labs  Lab 06/21/19 1225 06/22/19 0652 06/22/19 1728 06/23/19 0605  AST 17 14* 16 17  ALT 25 23 20 18   ALKPHOS 105 90 80 67  BILITOT 1.1 1.0 0.6 0.9  PROT 7.0 7.2 6.6 5.9*  ALBUMIN 4.2 4.1 3.5 3.1*   Recent Labs  Lab 06/21/19 1225  LIPASE 34   No results for input(s): AMMONIA in the last 168 hours. Coagulation Profile: No results for input(s): INR, PROTIME in the last 168 hours. Cardiac Enzymes: No results for input(s): CKTOTAL, CKMB, CKMBINDEX, TROPONINI in the last 168 hours. BNP (last 3 results) No results for input(s): PROBNP in the last 8760 hours. HbA1C: Recent Labs    06/23/19 0605  HGBA1C 4.7*   CBG: Recent Labs  Lab 06/23/19 0742  GLUCAP 91   Lipid Profile: No results for input(s): CHOL, HDL, LDLCALC, TRIG, CHOLHDL, LDLDIRECT in the last 72 hours. Thyroid Function Tests: Recent Labs    06/22/19 1728  TSH 0.791   Anemia Panel: No results for input(s): VITAMINB12, FOLATE, FERRITIN, TIBC, IRON, RETICCTPCT in the last 72 hours. Urine analysis:    Component Value Date/Time   COLORURINE YELLOW 06/21/2019 1201   APPEARANCEUR CLOUDY (A) 06/21/2019 1201   LABSPEC 1.010 06/21/2019 1201   PHURINE 7.5 06/21/2019 1201   GLUCOSEU NEGATIVE 06/21/2019 1201   HGBUR LARGE (A) 06/21/2019 1201   BILIRUBINUR NEGATIVE 06/21/2019 1201   KETONESUR NEGATIVE 06/21/2019 1201   PROTEINUR NEGATIVE 06/21/2019 1201   NITRITE NEGATIVE 06/21/2019 1201   LEUKOCYTESUR LARGE (A) 06/21/2019 1201   Sepsis Labs: @LABRCNTIP (procalcitonin:4,lacticidven:4)  ) Recent Results (from the past 240 hour(s))  Urine  culture     Status: None   Collection Time: 06/21/19 12:01 PM   Specimen: Urine, Clean Catch  Result Value Ref Range Status   Specimen Description   Final    URINE, CLEAN CATCH Performed at [facility] [place], [address] [place], [place] [address]    Special Requests   Final    NONE Performed at [facility] [place], [address] [place], [place] [address]    Culture   Final    NO GROWTH Performed at [facility], [address] [place], [place] [address]    Report Status 06/23/2019 FINAL  Final  Respiratory Panel by RT PCR (Flu A&B, Covid) - Nasopharyngeal Swab     Status: None   Collection Time: 06/21/19  2:00 PM   Specimen: Nasopharyngeal Swab  Result Value Ref Range Status   SARS Coronavirus 2 by RT PCR NEGATIVE NEGATIVE Final    Comment: (NOTE) SARS-CoV-2  target nucleic acids are NOT DETECTED. The SARS-CoV-2 RNA is generally detectable in upper respiratoy specimens during the acute phase of infection. The lowest concentration of SARS-CoV-2 viral copies this assay can detect is 131 copies/mL. A negative result does not preclude SARS-Cov-2 infection and should not be used as the sole basis for treatment or other patient management decisions. A negative result may occur with  improper specimen collection/handling, submission of specimen other than nasopharyngeal swab, presence of viral mutation(s) within the areas targeted by this assay, and inadequate number of viral copies (<131 copies/mL). A negative result must be combined with clinical observations, patient history, and epidemiological information. The expected result is Negative. Fact Sheet for Patients:  https://www.moore.com/ Fact Sheet for Healthcare Providers:  https://www.young.biz/ This test is not yet ap proved or cleared by the [place] FDA and  has been authorized for detection and/or diagnosis of SARS-CoV-2 by FDA under an Emergency Use  Authorization (EUA). This EUA will remain  in effect (meaning this test can be used) for the duration of the COVID-19 declaration under Section 564(b)(1) of the Act, 21 U.S.C. section 360bbb-3(b)(1), unless the authorization is terminated or revoked sooner.    Influenza A by PCR NEGATIVE NEGATIVE Final   Influenza B by PCR NEGATIVE NEGATIVE Final    Comment: (NOTE) The Xpert Xpress SARS-CoV-2/FLU/RSV assay is intended as an aid in  the diagnosis of influenza from Nasopharyngeal swab specimens and  should not be used as a sole basis for treatment. Nasal washings and  aspirates are unacceptable for Xpert Xpress SARS-CoV-2/FLU/RSV  testing. Fact Sheet for Patients: https://www.moore.com/ Fact Sheet for Healthcare Providers: https://www.young.biz/ This test is not yet approved or cleared by the [place] FDA and  has been authorized for detection and/or diagnosis of SARS-CoV-2 by  FDA under an Emergency Use Authorization (EUA). This EUA will remain  in effect (meaning this test can be used) for the duration of the  Covid-19 declaration under Section 564(b)(1) of the Act, 21  U.S.C. section 360bbb-3(b)(1), unless the authorization is  terminated or revoked. Performed at [facility], [address], [place], [place] [address]   Blood culture (routine x 2)     Status: None (Preliminary result)   Collection Time: 06/22/19  9:17 AM   Specimen: BLOOD  Result Value Ref Range Status   Specimen Description   Final    BLOOD RIGHT HAND Performed at [facility], [address], [place], [place] [address]    Special Requests   Final    BOTTLES DRAWN AEROBIC AND ANAEROBIC Blood Culture adequate volume Performed at [facility], [address] [place], [place] [address]    Culture   Final    NO GROWTH < 24 HOURS Performed at [facility], [address] [place], [place] [address]    Report Status  PENDING  Incomplete  SARS CORONAVIRUS 2 (TAT 6-24 HRS) Nasopharyngeal Nasopharyngeal Swab     Status: None   Collection Time: 06/22/19 10:42 AM   Specimen: Nasopharyngeal Swab  Result Value Ref Range Status   SARS Coronavirus 2 NEGATIVE NEGATIVE Final    Comment: (NOTE) SARS-CoV-2 target nucleic acids are NOT DETECTED. The SARS-CoV-2 RNA is generally detectable in upper and lower respiratory specimens during the acute phase of infection. Negative results do not preclude SARS-CoV-2 infection, do not rule out co-infections with other pathogens, and should not be used as the sole basis for treatment or other patient management decisions. Negative results  must be combined with clinical observations, patient history, and epidemiological information. The expected result is Negative. Fact Sheet for Patients: HairSlick.no Fact Sheet for Healthcare Providers: quierodirigir.com This test is not yet approved or cleared by the [place] FDA and  has been authorized for detection and/or diagnosis of SARS-CoV-2 by FDA under an Emergency Use Authorization (EUA). This EUA will remain  in effect (meaning this test can be used) for the duration of the COVID-19 declaration under Section 56 4(b)(1) of the Act, 21 U.S.C. section 360bbb-3(b)(1), unless the authorization is terminated or revoked sooner. Performed at [facility], [address], [place], [place] [address]   Blood culture (routine x 2)     Status: None (Preliminary result)   Collection Time: 06/22/19 10:52 AM   Specimen: BLOOD  Result Value Ref Range Status   Specimen Description   Final    BLOOD BLOOD RIGHT FOREARM Performed at [facility], [address] [place], [place] [address]    Special Requests   Final    BOTTLES DRAWN AEROBIC AND ANAEROBIC Blood Culture results may not be optimal due to an inadequate volume of blood received in culture  bottles Performed at [facility], [address], [place], [place] [address]    Culture   Final    NO GROWTH < 24 HOURS Performed at [facility], [address], [place], [place] [address]    Report Status PENDING  Incomplete      Studies: No results found.  Scheduled Meds: . alum & mag hydroxide-simeth  30 mL Oral Once  . heparin  5,000 Units Subcutaneous Q8H  . pantoprazole  40 mg Oral BID  . sodium bicarbonate  650 mg Oral TID  . tamsulosin  0.4 mg Oral Daily    Continuous Infusions: . 0.9 % NaCl with KCl 40 mEq / L 75 mL/hr (06/23/19 0538)  . cefTRIAXone (ROCEPHIN)  IV 2 g (06/23/19 1144)  . potassium chloride 10 mEq (06/23/19 1200)     LOS: 0 days     Briant Cedar, MD [facility]  If 7PM-7AM, please contact night-coverage [email or web address] 06/23/2019, 12:56 PM

## 2019-06-24 LAB — BASIC METABOLIC PANEL
Anion gap: 7 (ref 5–15)
BUN: 23 mg/dL — ABNORMAL HIGH (ref 6–20)
CO2: 15 mmol/L — ABNORMAL LOW (ref 22–32)
Calcium: 8 mg/dL — ABNORMAL LOW (ref 8.9–10.3)
Chloride: 120 mmol/L — ABNORMAL HIGH (ref 98–111)
Creatinine, Ser: 1.82 mg/dL — ABNORMAL HIGH (ref 0.61–1.24)
GFR calc Af Amer: 56 mL/min — ABNORMAL LOW (ref >60)
GFR calc non Af Amer: 48 mL/min — ABNORMAL LOW (ref >60)
Glucose, Bld: 98 mg/dL (ref 70–99)
Potassium: 3.3 mmol/L — ABNORMAL LOW (ref 3.5–5.1)
Sodium: 142 mmol/L (ref 135–145)

## 2019-06-24 LAB — CBC WITH DIFFERENTIAL/PLATELET
Abs Immature Granulocytes: 0.03 10*3/uL (ref 0.00–0.07)
Basophils Absolute: 0.1 10*3/uL (ref 0.0–0.1)
Basophils Relative: 1 %
Eosinophils Absolute: 0.3 10*3/uL (ref 0.0–0.5)
Eosinophils Relative: 3 %
HCT: 41.7 % (ref 39.0–52.0)
Hemoglobin: 13.6 g/dL (ref 13.0–17.0)
Immature Granulocytes: 0 %
Lymphocytes Relative: 22 %
Lymphs Abs: 2 10*3/uL (ref 0.7–4.0)
MCH: 29.7 pg (ref 26.0–34.0)
MCHC: 32.6 g/dL (ref 30.0–36.0)
MCV: 91 fL (ref 80.0–100.0)
Monocytes Absolute: 0.7 10*3/uL (ref 0.1–1.0)
Monocytes Relative: 8 %
Neutro Abs: 6.1 10*3/uL (ref 1.7–7.7)
Neutrophils Relative %: 66 %
Platelets: 241 10*3/uL (ref 150–400)
RBC: 4.58 MIL/uL (ref 4.22–5.81)
RDW: 15 % (ref 11.5–15.5)
WBC: 9.2 10*3/uL (ref 4.0–10.5)
nRBC: 0 % (ref 0.0–0.2)

## 2019-06-24 LAB — GLUCOSE, CAPILLARY: Glucose-Capillary: 100 mg/dL — ABNORMAL HIGH (ref 70–99)

## 2019-06-24 MED ORDER — POTASSIUM CHLORIDE CRYS ER 20 MEQ PO TBCR
40.0000 meq | EXTENDED_RELEASE_TABLET | Freq: Once | ORAL | Status: AC
  Administered 2019-06-24: 40 meq via ORAL
  Filled 2019-06-24: qty 2

## 2019-06-24 MED ORDER — POTASSIUM CHLORIDE 10 MEQ/100ML IV SOLN: 10.0000 meq | INTRAVENOUS | Status: DC

## 2019-06-24 MED ORDER — POTASSIUM CHLORIDE 10 MEQ/100ML IV SOLN
10.0000 meq | INTRAVENOUS | Status: DC
  Filled 2019-06-24: qty 100

## 2019-06-24 NOTE — Discharge Summary (Signed)
Discharge Summary  Adam Weaver IFO:[id number] DOB: [birth year]  PCP: Patient, No Pcp Per  Admit date: 06/22/2019 Discharge date: 06/24/2019  Time spent: 40 mins  Recommendations for Outpatient Follow-up:  1. Patient advised to call to make an appointment with [facility] in 1 week with follow-up labs 2. Follow-up with urology as scheduled  Discharge Diagnoses:  Active Hospital Problems   Diagnosis Date Noted  . Acute kidney injury superimposed on CKD (HCC) 06/22/2019  . Acute lower UTI 06/22/2019  . Right nephrolithiasis 06/22/2019  . Obesity (BMI 30-39.9) 06/22/2019  . Erythrocytosis 06/22/2019  . Hyperglycemia 06/22/2019  . 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 Hypokalemia 12/05/2017  . Leucocytosis 12/05/2017    Resolved Hospital Problems  No resolved problems to display.    Discharge Condition: Stable  Diet recommendation: Regular  Vitals:   06/23/19 1953 06/24/19 0602  BP: 118/76 122/88  Pulse: 85 74  Resp: 17 17  Temp: 97.8 F (36.6 C) 98.4 F (36.9 C)  SpO2: 94% 96%    History of present illness:  Adam Weaver a 33 y.o.malewith medical history significantfor obesity, familial hypokalemic periodic paralysis, chronic kidney disease stage III, nephrolithiasis with recent evaluation by urology for an obstructing 1.3 cm calculus in the right UPJ status post stent placement on 06/21/2019, presents with chief complaint of generalized weakness, inability to move some of his extremities. Patient has periods of bradycardia becomes paralyzed and weak when his potassium drops and he felt like his potassium was severely low. TR[facility] was asked to admit this patient for severe hypokalemia in the setting of hypokalemic periodic paralysis   Today, patient denies any new complaints, reports weakness has significantly improved, denies any chest pain, abdominal pain, nausea/vomiting, fever/chills.   Patient very eager to be discharged.  Advised patient to follow-up with  and wellness center.   Hospital Course:  Active Problems:   Hypokalemia   Leucocytosis   Hypokalemic periodic paralysis   Familial hypokalemic periodic paralysis   Acute kidney injury superimposed on CKD (HCC)   Acute lower UTI   Right nephrolithiasis   Obesity (BMI 30-39.9)   Erythrocytosis   Hyperglycemia   Severe hypokalemia in the setting of hypokalemic periodic paralysis Potassium level improved Improved generalized weakness Continue home potassium repletion  UTI in the setting of obstructing right UPJ calculus Status post cystoscopy and right ureteral stent placement on 06/21/2019 by urology Currently afebrile, with resolved leukocytosis UA with large hemoglobin, large leukocytes, few bacteria, greater than 50 WBC, negative nitrites UC no growth S/p IV ceftriaxone, continue home Keflex to complete 10 days as per urology Follow-up with urology for lithotripsy in 3 weeks  CKD stage IIIa Creatinine baseline around 1.8-2.0 Status post IV fluids Follow-up with repeat labs  Obesity Lifestyle modification advised            Malnutrition Type:      Malnutrition Characteristics:      Nutrition Interventions:      Estimated body mass index is 35.43 kg/m as calculated from the following:   Height as of 06/21/19: 5\' 11"  (1.803 m).   Weight as of this encounter: 115.2 kg.    Procedures:  None  Consultations:  None  Discharge Exam: BP 122/88 (BP Location: Left Arm)   Pulse 74   Temp 98.4 F (36.9 C) (Oral)   Resp 17   Wt 115.2 kg   SpO2 96%   BMI 35.43 kg/m   General: NAD Cardiovascular: S1,  S2 present Respiratory: CTA B  Discharge Instructions You were cared for by a hospitalist during your hospital stay. If you have any questions about your discharge medications or the care you received while you were in the hospital after you are discharged, you  can call the unit and asked to speak with the hospitalist on call if the hospitalist that took care of you is not available. Once you are discharged, your primary care physician will handle any further medical issues. Please note that NO REFILLS for any discharge medications will be authorized once you are discharged, as it is imperative that you return to your primary care physician (or establish a relationship with a primary care physician if you do not have one) for your aftercare needs so that they can reassess your need for medications and monitor your lab values.  Discharge Instructions    Diet - low sodium heart healthy   Complete by: As directed    Increase activity slowly   Complete by: As directed      Allergies as of 06/24/2019      Reactions   Zithromax [azithromycin] Anaphylaxis   Ciprofloxacin Hives      Medication List    TAKE these medications   cephALEXin 500 MG capsule Commonly known as: KEFLEX Take 1 capsule (500 mg total) by mouth 4 (four) times daily for 10 days.   oxybutynin 5 MG tablet Commonly known as: DITROPAN Take 1 tablet (5 mg total) by mouth every 8 (eight) hours as needed for bladder spasms.   oxyCODONE-acetaminophen 5-325 MG tablet Commonly known as: Percocet Take 1 tablet by mouth every 6 (six) hours as needed for severe pain (following stent placement for kidney stone).   Potassium 99 MG Tabs Take 891 mg by mouth 2 (two) times daily.   senna-docusate 8.6-50 MG tablet Commonly known as: Senokot-S Take 2 tablets by mouth daily as needed for mild constipation.   tamsulosin 0.4 MG Caps capsule Commonly known as: FLOMAX Take 1 capsule (0.4 mg total) by mouth daily.      Allergies  Allergen Reactions  . Zithromax [Azithromycin] Anaphylaxis  . Ciprofloxacin Hives   Follow-up Information    [facility]. Schedule an appointment as soon as possible for a visit in 1 week(s).   Why: Call this number to make an  appointment to set up with a PCP Contact information: [address] [place] [address] [phone]           The results of significant diagnostics from this hospitalization (including imaging, microbiology, ancillary and laboratory) are listed below for reference.    Significant Diagnostic Studies: DG C-Arm 1-60 Min-No Report  Result Date: 06/21/2019 Fluoroscopy was utilized by the requesting physician.  No radiographic interpretation.   CT Renal Stone Study  Result Date: 06/21/2019 CLINICAL DATA:  Right flank pain. Increased urine output for 2 days. History of renal calculi. EXAM: CT ABDOMEN AND PELVIS WITHOUT CONTRAST TECHNIQUE: Multidetector CT imaging of the abdomen and pelvis was performed following the standard protocol without IV contrast. COMPARISON:  12/25/2017 FINDINGS: Lower chest: Unremarkable Hepatobiliary: Unremarkable Pancreas: Unremarkable Spleen: Unremarkable Adrenals/Urinary Tract: Both adrenal glands appear normal. Innumerable bilateral renal calculi noted with possible medullary nephrocalcinosis contributing to the appearance of dense clustered calcifications bilaterally. Moderate to prominent right hydronephrosis is present extending down to a 1.3 cm in long axis right proximal ureteral stone shown on image 60/5. Right ureter distal to this stone is of normal caliber. No  left hydronephrosis. A fairly homogeneous 1.6 by 1.6 cm hyperdense exophytic lesion of the left kidney upper pole on image 39/2 is present and measured 1.6 by 1.4 cm on 12/25/2017. This is most likely a complex cyst although its enhancement characteristics are not assessed. A left kidney lower pole lesion measuring 2.9 by 2.6 by 2.5 cm on image 67/5 is present, this previously measured 2.7 by 2.4 by 2.3 cm. This lesion has fluid density internally but some subtle heterogeneity could have mild complexity. Other hypodense lesions of the left kidney are technically too small to  characterize. Urinary bladder unremarkable. Stomach/Bowel: Unremarkable Vascular/Lymphatic: Unremarkable Reproductive: Unremarkable Other: No supplemental non-categorized findings. Musculoskeletal: Unremarkable IMPRESSION: 1. Moderate to prominent right hydronephrosis due to a 1.3 cm in long axis right proximal ureteral stone. 2. 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 3. Left kidney lower pole lesion has enlarged compared to 12/25/2017 and is probably a complex cyst. Another clearly complex lesion of the left kidney upper pole is homogeneous and probably a cyst. The enhancement characteristics of these lesions are not assessed; if further workup for a renal mass is warranted, possibilities might include ultrasound or contrast enhanced cross-sectional imaging in the nonacute setting. Electronically Signed   By: Gaylyn Rong M.D.   On: 06/21/2019 13:19    Microbiology: Recent Results (from the past 240 hour(s))  Urine culture     Status: None   Collection Time: 06/21/19 12:01 PM   Specimen: Urine, Clean Catch  Result Value Ref Range Status   Specimen Description   Final    URINE, CLEAN CATCH Performed at [facility], [address] [place], [place] [address]    Special Requests   Final    NONE Performed at [facility], [address] [place], [place] [address]    Culture   Final    NO GROWTH Performed at [facility], [address], [place], [place] [address]    Report Status 06/23/2019 FINAL  Final  Respiratory Panel by RT PCR (Flu A&B, Covid) - Nasopharyngeal Swab     Status: None   Collection Time: 06/21/19  2:00 PM   Specimen: Nasopharyngeal Swab  Result Value Ref Range Status   SARS Coronavirus 2 by RT PCR NEGATIVE NEGATIVE Final    Comment: (NOTE) SARS-CoV-2 target nucleic acids are NOT DETECTED. The SARS-CoV-2 RNA is generally detectable in upper respiratoy specimens during the acute phase of infection. The  lowest concentration of SARS-CoV-2 viral copies this assay can detect is 131 copies/mL. A negative result does not preclude SARS-Cov-2 infection and should not be used as the sole basis for treatment or other patient management decisions. A negative result may occur with  improper specimen collection/handling, submission of specimen other than nasopharyngeal swab, presence of viral mutation(s) within the areas targeted by this assay, and inadequate number of viral copies (<131 copies/mL). A negative result must be combined with clinical observations, patient history, and epidemiological information. The expected result is Negative. Fact Sheet for Patients:  https://www.moore.com/ Fact Sheet for Healthcare Providers:  https://www.young.biz/ This test is not yet ap proved or cleared by the [place] FDA and  has been authorized for detection and/or diagnosis of SARS-CoV-2 by FDA under an Emergency Use Authorization (EUA). This EUA will remain  in effect (meaning this test can be used) for the duration of the COVID-19 declaration under Section 564(b)(1) of the Act, 21 U.S.C. section 360bbb-3(b)(1), unless the authorization is terminated or revoked  sooner.    Influenza A by PCR NEGATIVE NEGATIVE Final   Influenza B by PCR NEGATIVE NEGATIVE Final    Comment: (NOTE) The Xpert Xpress SARS-CoV-2/FLU/RSV assay is intended as an aid in  the diagnosis of influenza from Nasopharyngeal swab specimens and  should not be used as a sole basis for treatment. Nasal washings and  aspirates are unacceptable for Xpert Xpress SARS-CoV-2/FLU/RSV  testing. Fact Sheet for Patients: PinkCheek.be Fact Sheet for Healthcare Providers: GravelBags.it This test is not yet approved or cleared by the [place] FDA and  has been authorized for detection and/or diagnosis of SARS-CoV-2 by  FDA under an Emergency  Use Authorization (EUA). This EUA will remain  in effect (meaning this test can be used) for the duration of the  Covid-19 declaration under Section 564(b)(1) of the Act, 21  U.S.C. section 360bbb-3(b)(1), unless the authorization is  terminated or revoked. Performed at [facility], [place] [address] [place], [place] [address]   Blood culture (routine x 2)     Status: None (Preliminary result)   Collection Time: 06/22/19  9:17 AM   Specimen: BLOOD  Result Value Ref Range Status   Specimen Description   Final    BLOOD RIGHT HAND Performed at [facility] [address], [place], [place] [address]    Special Requests   Final    BOTTLES DRAWN AEROBIC AND ANAEROBIC Blood Culture adequate volume Performed at [facility] [address] [place], [place] [address]    Culture   Final    NO GROWTH 2 DAYS Performed at [facility] [address], [place], [place] [address]    Report Status PENDING  Incomplete  SARS CORONAVIRUS 2 (TAT 6-24 HRS) Nasopharyngeal Nasopharyngeal Swab     Status: None   Collection Time: 06/22/19 10:42 AM   Specimen: Nasopharyngeal Swab  Result Value Ref Range Status   SARS Coronavirus 2 NEGATIVE NEGATIVE Final    Comment: (NOTE) SARS-CoV-2 target nucleic acids are NOT DETECTED. The SARS-CoV-2 RNA is generally detectable in upper and lower respiratory specimens during the acute phase of infection. Negative results do not preclude SARS-CoV-2 infection, do not rule out co-infections with other pathogens, and should not be used as the sole basis for treatment or other patient management decisions. Negative results must be combined with clinical observations, patient history, and epidemiological information. The expected result is Negative. Fact Sheet for Patients: SugarRoll.be Fact Sheet for Healthcare Providers: https://www.woods-mathews.com/ This test is not yet  approved or cleared by the [place] FDA and  has been authorized for detection and/or diagnosis of SARS-CoV-2 by FDA under an Emergency Use Authorization (EUA). This EUA will remain  in effect (meaning this test can be used) for the duration of the COVID-19 declaration under Section 56 4(b)(1) of the Act, 21 U.S.C. section 360bbb-3(b)(1), unless the authorization is terminated or revoked sooner. Performed at [facility], [place] [address] [place], [place] [address]   Blood culture (routine x 2)     Status: None (Preliminary result)   Collection Time: 06/22/19 10:52 AM   Specimen: BLOOD  Result Value Ref Range Status   Specimen Description   Final    BLOOD BLOOD RIGHT FOREARM Performed at [facility] [address], [place], [place] [address]    Special Requests   Final    BOTTLES DRAWN AEROBIC AND ANAEROBIC Blood Culture results may not be optimal due to an inadequate volume of  blood received in culture bottles Performed at [facility], [name] [place], [place] [address]    Culture   Final    NO GROWTH 2 DAYS Performed at [facility], [address] [place], [place] [address]    Report Status PENDING  Incomplete     Labs: Basic Metabolic Panel: Recent Labs  Lab 06/22/19 0652 06/22/19 1728 06/23/19 0605 06/23/19 1812 06/24/19 0554  NA 139 141 143 144 142  K 2.2* 2.6* 2.3* 3.4* 3.3*  CL 117* 118* 124* 121* 120*  CO2 11* 12* 10* 14* 15*  GLUCOSE 139* 102* 106* 98 98  BUN 22* 24* 25* 27* 23*  CREATININE 2.22* 1.96* 2.02* 1.99* 1.82*  CALCIUM 8.9 8.2* 8.4* 8.3* 8.0*  MG 2.5*  --  2.1  --   --   PHOS  --   --  2.7  --   --    Liver Function Tests: Recent Labs  Lab 06/21/19 1225 06/22/19 0652 06/22/19 1728 06/23/19 0605  AST 17 14* 16 17  ALT 25 23 20 18   ALKPHOS 105 90 80 67  BILITOT 1.1 1.0 0.6 0.9  PROT 7.0 7.2 6.6 5.9*  ALBUMIN 4.2 4.1 3.5 3.1*   Recent Labs  Lab 06/21/19 1225  LIPASE 34    No results for input(s): AMMONIA in the last 168 hours. CBC: Recent Labs  Lab 06/21/19 1225 06/22/19 0652 06/23/19 0605 06/24/19 0554  WBC 14.0* 13.8* 13.7* 9.2  NEUTROABS 11.8* 12.4* 10.6* 6.1  HGB 16.7 17.2* 14.5 13.6  HCT 49.1 51.9 45.0 41.7  MCV 88.8 91.7 92.6 91.0  PLT 227 238 221 241   Cardiac Enzymes: No results for input(s): CKTOTAL, CKMB, CKMBINDEX, TROPONINI in the last 168 hours. BNP: BNP (last 3 results) No results for input(s): BNP in the last 8760 hours.  ProBNP (last 3 results) No results for input(s): PROBNP in the last 8760 hours.  CBG: Recent Labs  Lab 06/23/19 0742 06/24/19 0810  GLUCAP 91 100*       Signed:  Briant CedarNkeiruka J Emily Forse, MD [facility] 06/24/2019, 11:20 AM

## 2019-06-27 LAB — CULTURE, BLOOD (ROUTINE X 2)
Culture: NO GROWTH
Culture: NO GROWTH
Special Requests: ADEQUATE

## 2019-07-05 ENCOUNTER — Other Ambulatory Visit: Payer: Self-pay | Admitting: Urology

## 2019-07-16 ENCOUNTER — Encounter (HOSPITAL_BASED_OUTPATIENT_CLINIC_OR_DEPARTMENT_OTHER): Payer: Self-pay | Admitting: Urology

## 2019-07-16 ENCOUNTER — Other Ambulatory Visit: Payer: Self-pay

## 2019-07-16 ENCOUNTER — Other Ambulatory Visit (HOSPITAL_COMMUNITY)
Admission: RE | Admit: 2019-07-16 | Discharge: 2019-07-16 | Disposition: A | Discharge status: Home / Self Care | Payer: Medicaid Other | Source: Ambulatory Visit | Attending: Urology | Admitting: Urology

## 2019-07-16 DIAGNOSIS — Z01812 Encounter for preprocedural laboratory examination: Secondary | ICD-10-CM | POA: Diagnosis present

## 2019-07-16 DIAGNOSIS — Z20822 Contact with and (suspected) exposure to covid-19: Secondary | ICD-10-CM | POA: Diagnosis not present

## 2019-07-16 NOTE — Progress Notes (Addendum)
Spoke w/ via phone for pre-op interview---[facility] needs dos----     I STAT 8         Lab results------EKG 08-03-2018 CHART/EPIC COVID test ------07-16-2019 Arrive at -------945 AM 07-20-2019 NPO after ------MIDNIGHT Medications to take morning of surgery -----NONE Diabetic medication -----N/A Patient Special Instructions ----- Pre-Op special Istructions ----- Patient verbalized understanding of instructions that were given at this phone interview. Patient denies shortness of breath, chest pain, fever, cough a this phone interview.

## 2019-07-17 LAB — NOVEL CORONAVIRUS, NAA (HOSP ORDER, SEND-OUT TO REF LAB; TAT 18-24 HRS): SARS-CoV-2, NAA: NOT DETECTED

## 2019-07-19 ENCOUNTER — Other Ambulatory Visit: Payer: Self-pay | Admitting: Urology

## 2019-07-19 ENCOUNTER — Telehealth: Payer: Self-pay | Admitting: Urology

## 2019-07-19 MED ORDER — SULFAMETHOXAZOLE-TRIMETHOPRIM 800-160 MG PO TABS: 1.0000 | ORAL_TABLET | Freq: Two times a day (BID) | ORAL | 0 refills | Status: AC

## 2019-07-19 NOTE — Telephone Encounter (Signed)
Called patient. Sent in Bactrim. Will get UA in Preop

## 2019-07-20 ENCOUNTER — Encounter (HOSPITAL_BASED_OUTPATIENT_CLINIC_OR_DEPARTMENT_OTHER)
Admission: RE | Disposition: A | Discharge status: Home / Self Care | Payer: Self-pay | Source: Ambulatory Visit | Attending: Urology

## 2019-07-20 ENCOUNTER — Ambulatory Visit (HOSPITAL_BASED_OUTPATIENT_CLINIC_OR_DEPARTMENT_OTHER): Payer: Self-pay | Admitting: Anesthesiology

## 2019-07-20 ENCOUNTER — Ambulatory Visit (HOSPITAL_BASED_OUTPATIENT_CLINIC_OR_DEPARTMENT_OTHER)
Admission: RE | Admit: 2019-07-20 | Discharge: 2019-07-20 | Disposition: A | Discharge status: Home / Self Care | Payer: Self-pay | Source: Ambulatory Visit | Attending: Urology | Admitting: Urology

## 2019-07-20 ENCOUNTER — Other Ambulatory Visit: Payer: Self-pay

## 2019-07-20 ENCOUNTER — Encounter (HOSPITAL_BASED_OUTPATIENT_CLINIC_OR_DEPARTMENT_OTHER): Payer: Self-pay | Admitting: Urology

## 2019-07-20 DIAGNOSIS — N183 Chronic kidney disease, stage 3 unspecified: Secondary | ICD-10-CM | POA: Insufficient documentation

## 2019-07-20 DIAGNOSIS — N202 Calculus of kidney with calculus of ureter: Secondary | ICD-10-CM | POA: Insufficient documentation

## 2019-07-20 DIAGNOSIS — Z881 Allergy status to other antibiotic agents status: Secondary | ICD-10-CM | POA: Insufficient documentation

## 2019-07-20 DIAGNOSIS — T8389XA Other specified complication of genitourinary prosthetic devices, implants and grafts, initial encounter: Secondary | ICD-10-CM | POA: Insufficient documentation

## 2019-07-20 DIAGNOSIS — N179 Acute kidney failure, unspecified: Secondary | ICD-10-CM

## 2019-07-20 DIAGNOSIS — Z6835 Body mass index (BMI) 35.0-35.9, adult: Secondary | ICD-10-CM | POA: Insufficient documentation

## 2019-07-20 DIAGNOSIS — F17210 Nicotine dependence, cigarettes, uncomplicated: Secondary | ICD-10-CM | POA: Insufficient documentation

## 2019-07-20 DIAGNOSIS — N189 Chronic kidney disease, unspecified: Secondary | ICD-10-CM

## 2019-07-20 DIAGNOSIS — Y831 Surgical operation with implant of artificial internal device as the cause of abnormal reaction of the patient, or of later complication, without mention of misadventure at the time of the procedure: Secondary | ICD-10-CM | POA: Insufficient documentation

## 2019-07-20 DIAGNOSIS — Z87442 Personal history of urinary calculi: Secondary | ICD-10-CM | POA: Insufficient documentation

## 2019-07-20 DIAGNOSIS — E669 Obesity, unspecified: Secondary | ICD-10-CM | POA: Insufficient documentation

## 2019-07-20 HISTORY — PX: CYSTOSCOPY WITH RETROGRADE PYELOGRAM, URETEROSCOPY AND STENT PLACEMENT: SHX5789

## 2019-07-20 HISTORY — PX: HOLMIUM LASER APPLICATION: SHX5852

## 2019-07-20 HISTORY — DX: Personal history of urinary calculi: Z87.442

## 2019-07-20 LAB — URINALYSIS, COMPLETE (UACMP) WITH MICROSCOPIC
Bilirubin Urine: NEGATIVE
Glucose, UA: NEGATIVE mg/dL
Ketones, ur: NEGATIVE mg/dL
Nitrite: NEGATIVE
Protein, ur: 30 mg/dL — AB
Specific Gravity, Urine: 1.008 (ref 1.005–1.030)
WBC, UA: >50 WBC/hpf — ABNORMAL HIGH (ref 0–5)
pH: 8 (ref 5.0–8.0)

## 2019-07-20 LAB — POCT I-STAT, CHEM 8
BUN: 20 mg/dL (ref 6–20)
Calcium, Ion: 1.26 mmol/L (ref 1.15–1.40)
Chloride: 114 mmol/L — ABNORMAL HIGH (ref 98–111)
Creatinine, Ser: 1.7 mg/dL — ABNORMAL HIGH (ref 0.61–1.24)
Glucose, Bld: 103 mg/dL — ABNORMAL HIGH (ref 70–99)
HCT: 54 % — ABNORMAL HIGH (ref 39.0–52.0)
Hemoglobin: 18.4 g/dL — ABNORMAL HIGH (ref 13.0–17.0)
Potassium: 3.4 mmol/L — ABNORMAL LOW (ref 3.5–5.1)
Sodium: 146 mmol/L — ABNORMAL HIGH (ref 135–145)
TCO2: 17 mmol/L — ABNORMAL LOW (ref 22–32)

## 2019-07-20 SURGERY — CYSTOURETEROSCOPY, WITH RETROGRADE PYELOGRAM AND STENT INSERTION
Anesthesia: General | Site: Ureter | Laterality: Right

## 2019-07-20 MED ORDER — HYDROMORPHONE HCL 1 MG/ML IJ SOLN
0.2500 mg | INTRAMUSCULAR | Status: DC | PRN
  Filled 2019-07-20: qty 0.5

## 2019-07-20 MED ORDER — IOHEXOL 300 MG/ML  SOLN
INTRAMUSCULAR | Status: DC | PRN
  Administered 2019-07-20: 10 mL via URETHRAL

## 2019-07-20 MED ORDER — DEXAMETHASONE SODIUM PHOSPHATE 10 MG/ML IJ SOLN
INTRAMUSCULAR | Status: AC
  Filled 2019-07-20: qty 1

## 2019-07-20 MED ORDER — ACETAMINOPHEN 10 MG/ML IV SOLN
1000.0000 mg | Freq: Once | INTRAVENOUS | Status: DC | PRN
  Filled 2019-07-20: qty 100

## 2019-07-20 MED ORDER — MIDAZOLAM HCL 2 MG/2ML IJ SOLN
INTRAMUSCULAR | Status: AC
  Filled 2019-07-20: qty 2

## 2019-07-20 MED ORDER — FENTANYL CITRATE (PF) 100 MCG/2ML IJ SOLN
INTRAMUSCULAR | Status: AC
  Filled 2019-07-20: qty 2

## 2019-07-20 MED ORDER — PROMETHAZINE HCL 25 MG/ML IJ SOLN
6.2500 mg | INTRAMUSCULAR | Status: DC | PRN
  Filled 2019-07-20: qty 1

## 2019-07-20 MED ORDER — CEFAZOLIN SODIUM-DEXTROSE 2-3 GM-%(50ML) IV SOLR
INTRAVENOUS | Status: DC | PRN
  Administered 2019-07-20 (×2): 2 g via INTRAVENOUS

## 2019-07-20 MED ORDER — CEFAZOLIN SODIUM-DEXTROSE 2-4 GM/100ML-% IV SOLN
INTRAVENOUS | Status: AC
  Filled 2019-07-20: qty 100

## 2019-07-20 MED ORDER — HYDROCODONE-ACETAMINOPHEN 7.5-325 MG PO TABS
1.0000 | ORAL_TABLET | Freq: Once | ORAL | Status: DC | PRN
  Filled 2019-07-20: qty 1

## 2019-07-20 MED ORDER — DEXAMETHASONE SODIUM PHOSPHATE 4 MG/ML IJ SOLN
INTRAMUSCULAR | Status: DC | PRN
  Administered 2019-07-20: 10 mg via INTRAVENOUS

## 2019-07-20 MED ORDER — PROPOFOL 10 MG/ML IV BOLUS
INTRAVENOUS | Status: DC | PRN
  Administered 2019-07-20: 250 mg via INTRAVENOUS

## 2019-07-20 MED ORDER — SODIUM CHLORIDE 0.9 % IV SOLN: INTRAVENOUS | Status: DC | PRN

## 2019-07-20 MED ORDER — SODIUM CHLORIDE 0.9 % IV SOLN
2.0000 g | Freq: Once | INTRAVENOUS | Status: DC
  Filled 2019-07-20: qty 20

## 2019-07-20 MED ORDER — LIDOCAINE HCL (CARDIAC) PF 100 MG/5ML IV SOSY
PREFILLED_SYRINGE | INTRAVENOUS | Status: DC | PRN
  Administered 2019-07-20: 100 mg via INTRAVENOUS

## 2019-07-20 MED ORDER — PROPOFOL 10 MG/ML IV BOLUS
INTRAVENOUS | Status: AC
  Filled 2019-07-20: qty 20

## 2019-07-20 MED ORDER — OXYCODONE HCL 5 MG PO TABS: 5.0000 mg | ORAL_TABLET | Freq: Three times a day (TID) | ORAL | 0 refills | Status: DC | PRN

## 2019-07-20 MED ORDER — LIDOCAINE 2% (20 MG/ML) 5 ML SYRINGE
INTRAMUSCULAR | Status: AC
  Filled 2019-07-20: qty 5

## 2019-07-20 MED ORDER — OXYCODONE HCL 5 MG PO TABS
5.0000 mg | ORAL_TABLET | ORAL | Status: DC | PRN
  Filled 2019-07-20: qty 1

## 2019-07-20 MED ORDER — ONDANSETRON HCL 4 MG/2ML IJ SOLN
INTRAMUSCULAR | Status: AC
  Filled 2019-07-20: qty 2

## 2019-07-20 MED ORDER — CEFAZOLIN SODIUM-DEXTROSE 2-4 GM/100ML-% IV SOLN
2.0000 g | INTRAVENOUS | Status: DC
  Filled 2019-07-20: qty 100

## 2019-07-20 MED ORDER — ONDANSETRON HCL 4 MG/2ML IJ SOLN
INTRAMUSCULAR | Status: DC | PRN
  Administered 2019-07-20: 4 mg via INTRAVENOUS

## 2019-07-20 MED ORDER — MIDAZOLAM HCL 5 MG/5ML IJ SOLN
INTRAMUSCULAR | Status: DC | PRN
  Administered 2019-07-20: 2 mg via INTRAVENOUS

## 2019-07-20 MED ORDER — SODIUM CHLORIDE 0.9 % IV SOLN
INTRAVENOUS | Status: DC
  Filled 2019-07-20: qty 1000

## 2019-07-20 MED ORDER — FENTANYL CITRATE (PF) 100 MCG/2ML IJ SOLN
INTRAMUSCULAR | Status: DC | PRN
  Administered 2019-07-20: 25 ug via INTRAVENOUS
  Administered 2019-07-20: 50 ug via INTRAVENOUS
  Administered 2019-07-20: 25 ug via INTRAVENOUS
  Administered 2019-07-20 (×2): 50 ug via INTRAVENOUS

## 2019-07-20 MED ORDER — MEPERIDINE HCL 25 MG/ML IJ SOLN
6.2500 mg | INTRAMUSCULAR | Status: DC | PRN
  Filled 2019-07-20: qty 1

## 2019-07-20 MED ORDER — SODIUM CHLORIDE 0.9 % IR SOLN
Status: DC | PRN
  Administered 2019-07-20: 3000 mL via INTRAVESICAL

## 2019-07-20 SURGICAL SUPPLY — 26 items
BAG DRAIN URO-CYSTO SKYTR STRL (DRAIN) ×3 IMPLANT
BASKET ZERO TIP NITINOL 2.4FR (BASKET) ×2 IMPLANT
CATH URET 5FR 28IN OPEN ENDED (CATHETERS) ×3 IMPLANT
CATH URET DUAL LUMEN 6-10FR 50 (CATHETERS) ×2 IMPLANT
CLOTH BEACON ORANGE TIMEOUT ST (SAFETY) ×3 IMPLANT
DRSG TEGADERM 4X4.75 (GAUZE/BANDAGES/DRESSINGS) IMPLANT
EXTRACTOR STONE 1.7FRX115CM (UROLOGICAL SUPPLIES) IMPLANT
FIBER LASER FLEXIVA 200 (UROLOGICAL SUPPLIES) ×2 IMPLANT
GLOVE BIO SURGEON STRL SZ 6.5 (GLOVE) ×2 IMPLANT
GLOVE BIO SURGEON STRL SZ7.5 (GLOVE) ×2 IMPLANT
GLOVE BIO SURGEON STRL SZ8 (GLOVE) ×2 IMPLANT
GLOVE BIO SURGEONS STRL SZ 6.5 (GLOVE) ×1
GLOVE BIOGEL PI IND STRL 8.5 (GLOVE) IMPLANT
GLOVE BIOGEL PI INDICATOR 8.5 (GLOVE) ×4
GOWN STRL REUS W/TWL LRG LVL3 (GOWN DISPOSABLE) ×3 IMPLANT
GOWN STRL REUS W/TWL XL LVL3 (GOWN DISPOSABLE) ×2 IMPLANT
GUIDEWIRE ANG ZIPWIRE 038X150 (WIRE) ×2 IMPLANT
GUIDEWIRE STR DUAL SENSOR (WIRE) ×5 IMPLANT
KIT TURNOVER CYSTO (KITS) IMPLANT
MANIFOLD NEPTUNE II (INSTRUMENTS) ×3 IMPLANT
PACK CYSTO (CUSTOM PROCEDURE TRAY) ×3 IMPLANT
SHEATH URET ACCESS 12FR/35CM (UROLOGICAL SUPPLIES) ×2 IMPLANT
STENT URET 6FRX26 CONTOUR (STENTS) ×2 IMPLANT
TUBE CONNECTING 12'X1/4 (SUCTIONS) ×1
TUBE CONNECTING 12X1/4 (SUCTIONS) ×2 IMPLANT
TUBING UROLOGY SET (TUBING) ×3 IMPLANT

## 2019-07-20 NOTE — Anesthesia Procedure Notes (Signed)
Procedure Name: LMA Insertion Date/Time: 07/20/2019 11:03 AM Performed by: Jessica Priest, CRNA Pre-anesthesia Checklist: Patient identified, Emergency Drugs available, Suction available and Patient being monitored Patient Re-evaluated:Patient Re-evaluated prior to induction Oxygen Delivery Method: Circle system utilized Preoxygenation: Pre-oxygenation with 100% oxygen Induction Type: IV induction Ventilation: Mask ventilation without difficulty LMA: LMA inserted LMA Size: 5.0 Number of attempts: 1 Airway Equipment and Method: Bite block Placement Confirmation: positive ETCO2 and breath sounds checked- equal and bilateral Tube secured with: Tape Dental Injury: Teeth and Oropharynx as per pre-operative assessment

## 2019-07-20 NOTE — Op Note (Signed)
Preoperative Diagnosis: Right ureterolithiasis and nephrolithiasis  Postoperative Diagnosis:  Same  Procedure(s) Performed:   1. Cystourethroscopy 2.  Right ureteral stent removal 3.  Right retrograde pyelogram 4.  Right ureteroscopic stone extraction with laser lithotripsy 5.  Right ureteral stent placement, 6 x 26 cm 7. Intraoperative fluoroscopy with interpretation <1hr.   Teaching Surgeon:  Dr. Claudia Desanctis  Resident Surgeon:  Tharon Aquas, MD  Assistant(s):  None  Anesthesia:  General via LMA  Fluids:  See anesthesia record  Estimated blood loss:  56m  Specimens:   calculus for stone analysis (Dr. PClaudia Desanctisto bring to clinic)  Cultures:  Preop urine   Drains:  Right  6Fr x 26cm JJ ureteral stent without dangler  Complications:  None  Indications: 34y.o. patient with a history of nephrocalcinosis.  Had obstructing right ureteral stone underwent stent placement in December 2020.  Now presents for right ureteroscopic stone extraction. Risks & benefits of the procedure discussed with the patient, who wishes to proceed.  Findings:   -Encrusted right ureteral stent, unable to cannulate with a wire -Access to the right kidney obtained using a wire next ureteral stent was then removed without difficulty. -Ureter easily accommodated 12/14 access sheath -Previous ureteral stones were now in the renal pelvis.  There were 2 large pelvis stones and several other large stones in each calyx.  3 of the stones were relocated into the upper pole and we attempted to dusted with high frequency laser settings.  Stone proved difficult to dust due increased respiratory movement and dense stone.  One large fragment was removed. -We made minimal progress fragmenting stone over a 60-minute period.  It became evident we had no chance of clearing his right kidney.  Decision was made to restent and plan for PCNL.  Radiologic Interpretation of Retrograde Pyelogram: Right retrograde pyelogram demonstrated No  contrast extravasation, filling defects or evidence of hydroureteronephrosis.  Description:  The patient was correctly identified in the preop holding area where written informed consent as well potential risk and complication reviewed. The patient agreed. They were brought back to the operative suite where a preinduction timeout was performed. Once correct information was verified, general anesthesia was induced via LMA. They were then gently placed into dorsal lithotomy position with SCDs in place for VTE prophylaxis. They were prepped and draped in the usual sterile fashion and given appropriate preoperative antibiotics with . A second timeout was then performed.   We inserted a 3F rigid cystoscope per urethra with copious lubrication and normal saline irrigation running. This demonstrated findings as described above.    Existing stent - We turned our attention to the Right ureteral orifice with a ureteral stent emanating without obvious encrustation and good curl within the bladder. We grasped the distal end of the ureteral stent and brought it to the ureteral orifice with the flexible graspers. A sensor wire was then advanced into the ureteral stent a significant resistance was met approximately 5 cm from the distal end of the stent.  We also attempted to cannulate the stent with a Glidewire but this was also unsuccessful.  A sensor wire was then advanced alongside the stent into the right renal pelvis under fluoroscopic guidance without resistance.  The previously placed stent was removed.  The lumen was advanced into the distal ureter and a second wire was placed into the right renal pelvis.  We then obtained a 12/14Fr x 35cm ureteral access sheath which was easily passed over the sensor wire into the proximal ureter  under fluoroscopic guidance. The inner cannula was removed and was replaced by the flexible ureteroscope and ureteroscopy was performed. This demonstrated NO ureteral stones or  scarring.  Pyeloscopy revealed 2 stone fragments in the renal pelvis as well as several large stones in other calyces.  Using a 0 tip basket we relocated the renal pelvis stones and one other large stone into the upper pole.  Wethen obtained a 200 micron holmium laser fiber and performed laser lithotripsy under high frequency stone settings to try and fragmented/dusted the stone.  The stone was extremely sticky and dense which made fragmenting difficult.  Patient also had a hypermobile kidney that resulted in marked movements with each respiration.  We spent approximately 60 minutes treating the stone and removed 1 sizable fragment.  At the end of this time it became evident that we would not be able to clear his stone burden and that he would require PCNL in the future.  Decision was made to terminate ureteroscopy.  The sheath was removed under direct vision and there were no fragments in the ureter.  Wire was left in place.    We then advanced a 6Fr x 26cm JJ ureteral stent without dangler with the assistance of a stent pusher under direct fluoroscopic & visual guidance  without difficultly. Sensor wire removal demonstrated satisfactory stent curl proximally in the renal pelvis and distally in the bladder. The bladder was emptied and all instrumentation was removed. The patient was woken up from anesthesia and taken to the recovery unit for routine postoperative care.   Post Op Plan:   1.  Discharge home once voids x1 2.  Return to resident clinic to discuss PCNL.  Would have VIR placed access prior and start on the right side 3.  Referral to nephrology placed for nephrocalcinosis 4.  Urine culture pending, we will follow up results.  Until then should take Bactrim empirically  Attestation:  Dr. Claudia Desanctis was present and scrubbed for key portions of the procedure.

## 2019-07-20 NOTE — H&P (Addendum)
Urology History and Physical  Plan   - Risk of the surgery will be discussed in preop holding and informed consent will be obtained accordingly. Will discuss possible need for staged procedure given large stone burden. Will discuss risks of surgery including stent discomfort, hematuria, ureteral injury.   History of Present Illness:    34 yo man withx of right flank pain found to have 1.3 cm right ureteral calculus. Underwent right ureteral stent on 06/21/19. Urine culture NG but was prescribed keflex. Given Bactrim last night. UA this AM pending.   No other changes to history.   Medical History: Past Medical History:  Diagnosis Date  . History of kidney stones   . Hypokalemia   . Hypokalemic periodic paralysis    POTASSIUM GETS LOW TROUBLE MOVING DAY AFTER SURGERY  . Kidney stones   . Renal disorder    STAGE 3 CKD, NO NEPHROLOGIST    Surgical History: Past Surgical History:  Procedure Laterality Date  . CYSTOSCOPY W/ URETERAL STENT PLACEMENT Right 06/21/2019   Procedure: CYSTOSCOPY WITH RIGHT URETERAL STENT PLACEMENT;  Surgeon: Noel Christmas, MD;  Location: [facility];  Service: Urology;  Laterality: Right;  . LITHOTRIPSY    . PINS IN RIGHT ARM     LATER REMOVED    Social History:  reports that he has been smoking cigarettes. He has a 3.00 pack-year smoking history. He has never used smokeless tobacco. He reports current alcohol use. He reports that he does not use drugs.  Family History:family history is not on file.  Medications:  Current Facility-Administered Medications:  .  0.9 %  sodium chloride infusion, , Intravenous, Continuous, Houser, Stephen A, MD .  ceFAZolin (ANCEF) IVPB 2g/100 mL premix, 2 g, Intravenous, 30 min Pre-Op, Pace, Maryellen D, MD  Allergies:is allergic to ciprofloxacin and zithromax [azithromycin].  Review of Systems:   Remainder of a 10 system review of systems is negative.   Objective   Vitals History: BP Readings from Last 4  Encounters:  06/24/19 122/88  06/21/19 (!) 144/87  08/05/18 130/81  06/10/18 (!) 151/98   Pulse Readings from Last 4 Encounters:  06/24/19 74  06/21/19 98  08/05/18 83  06/10/18 (!) 103    Lab History: Lab Results  Component Value Date   CREATININE 1.82 (H) 06/24/2019   Lab Results  Component Value Date   HGB 13.6 06/24/2019

## 2019-07-20 NOTE — Discharge Instructions (Signed)
1. We were able to remove some of the kidney stones, but you still have many stones remaining and will need another surgery to remove them. Until then you have a ureteral stent in place on the inside WITHOUT a string. The stent is TEMPORARY and should be removed within 3 months. You need to follow up for your next appointment, otherwise damage to the kidney and stent may occur 2. You may see some blood in the urine and may have some burning with urination for 48-72 hours. You also may notice that you have to urinate more frequently or urgently after your procedure which is normal.  3. You should call should you develop an inability urinate, fever > 101, persistent nausea and vomiting that prevents you from eating or drinking to stay hydrated.  4. If you have a stent, you will likely urinate more frequently and urgently until the stent is removed and you may experience some discomfort/pain in the lower abdomen and flank especially when urinating. You may take pain medication prescribed to you if needed for pain. You may also intermittently have blood in the urine until the stent is removed.  5. If you have questions you should call the office [phone]) to notify us.   Post Anesthesia Home Care Instructions  Activity: Get plenty of rest for the remainder of the day. A responsible individual must stay with you for 24 hours following the procedure.  For the next 24 hours, DO NOT: -Drive a car -Advertising copywriter -Drink alcoholic beverages -Take any medication unless instructed by your physician -Make any legal decisions or sign important papers.  Meals: Start with liquid foods such as gelatin or soup. Progress to regular foods as tolerated. Avoid greasy, spicy, heavy foods. If nausea and/or vomiting occur, drink only clear liquids until the nausea and/or vomiting subsides. Call your physician if vomiting continues.  Special Instructions/Symptoms: Your throat may feel dry or sore from the  anesthesia or the breathing tube placed in your throat during surgery. If this causes discomfort, gargle with warm salt water. The discomfort should disappear within 24 hours.

## 2019-07-20 NOTE — Transfer of Care (Signed)
Immediate Anesthesia Transfer of Care Note  Patient: Adam Weaver  Procedure(s) Performed: Procedure(s) (LRB): CYSTOSCOPY WITH RETROGRADE PYELOGRAM, URETEROSCOPY AND STENT PLACEMENT (Right) HOLMIUM LASER APPLICATION (Right)  Patient Location: PACU  Anesthesia Type: General  Level of Consciousness: awake, sedated, patient cooperative and responds to stimulation  Airway & Oxygen Therapy: Patient Spontanous Breathing and Patient connected to NC02 and soft FM  Post-op Assessment: Report given to PACU RN, Post -op Vital signs reviewed and stable and Patient moving all extremities  Post vital signs: Reviewed and stable  Complications: No apparent anesthesia complications

## 2019-07-20 NOTE — Anesthesia Preprocedure Evaluation (Addendum)
Anesthesia Evaluation  Patient identified by MRN, date of birth, ID band Patient awake    Reviewed: Allergy & Precautions, NPO status , Patient's Chart, lab work & pertinent test results  Airway Mallampati: II  TM Distance: >3 FB Neck ROM: Full    Dental no notable dental hx. (+) Teeth Intact, Dental Advisory Given   Pulmonary neg pulmonary ROS, Current Smoker and Patient abstained from smoking.,    Pulmonary exam normal breath sounds clear to auscultation       Cardiovascular Exercise Tolerance: Good Normal cardiovascular exam Rhythm:Regular Rate:Normal     Neuro/Psych negative neurological ROS  negative psych ROS   GI/Hepatic negative GI ROS, Neg liver ROS,   Endo/Other  negative endocrine ROS  Renal/GU Renal InsufficiencyRenal disease     Musculoskeletal negative musculoskeletal ROS (+)   Abdominal   Peds  Hematology negative hematology ROS (+)   Anesthesia Other Findings   Reproductive/Obstetrics                            Anesthesia Physical Anesthesia Plan  ASA: II  Anesthesia Plan: General   Post-op Pain Management:    Induction: Intravenous  PONV Risk Score and Plan: 2 and Treatment may vary due to age or medical condition, Ondansetron, Dexamethasone and Midazolam  Airway Management Planned: LMA  Additional Equipment:   Intra-op Plan:   Post-operative Plan:   Informed Consent: I have reviewed the patients History and Physical, chart, labs and discussed the procedure including the risks, benefits and alternatives for the proposed anesthesia with the patient or authorized representative who has indicated his/her understanding and acceptance.     Dental advisory given  Plan Discussed with: CRNA  Anesthesia Plan Comments:         Anesthesia Quick Evaluation

## 2019-07-20 NOTE — Anesthesia Postprocedure Evaluation (Signed)
Anesthesia Post Note  Patient: Adam Weaver  Procedure(s) Performed: CYSTOSCOPY WITH RETROGRADE PYELOGRAM, URETEROSCOPY AND STENT PLACEMENT (Right Ureter) HOLMIUM LASER APPLICATION (Right Ureter)     Patient location during evaluation: PACU Anesthesia Type: General Level of consciousness: awake and alert Pain management: pain level controlled Vital Signs Assessment: post-procedure vital signs reviewed and stable Respiratory status: spontaneous breathing, nonlabored ventilation, respiratory function stable and patient connected to nasal cannula oxygen Cardiovascular status: blood pressure returned to baseline and stable Postop Assessment: no apparent nausea or vomiting Anesthetic complications: no    Last Vitals:  Vitals:   07/20/19 1315 07/20/19 1406  BP: 126/78 130/85  Pulse: 80 74  Resp: 20 19  Temp:  36.7 C  SpO2: 93% 95%    Last Pain:  Vitals:   07/20/19 1406  TempSrc:   PainSc: 0-No pain                 Trevor Iha

## 2019-07-20 NOTE — Interval H&P Note (Signed)
History and Physical Interval Note:  07/20/2019 10:56 AM  Adam Weaver  has presented today for surgery, with the diagnosis of RIGHT URETERAL CALCULUS.  The various methods of treatment have been discussed with the patient and family. After consideration of risks, benefits and other options for treatment, the patient has consented to  Procedure(s) with comments: CYSTOSCOPY WITH RETROGRADE PYELOGRAM, URETEROSCOPY AND STENT PLACEMENT (Right) - 90 MINS HOLMIUM LASER APPLICATION (Right) as a surgical intervention.  The patient's history has been reviewed, patient examined, no change in status, stable for surgery.  I have reviewed the patient's chart and labs.  Questions were answered to the patient's satisfaction.     [name] D Keeara Frees

## 2019-07-21 LAB — URINE CULTURE: Culture: <10000 — AB

## 2019-09-12 ENCOUNTER — Other Ambulatory Visit: Payer: Self-pay

## 2019-09-12 ENCOUNTER — Encounter (HOSPITAL_BASED_OUTPATIENT_CLINIC_OR_DEPARTMENT_OTHER): Payer: Self-pay | Admitting: Emergency Medicine

## 2019-09-12 ENCOUNTER — Emergency Department (HOSPITAL_BASED_OUTPATIENT_CLINIC_OR_DEPARTMENT_OTHER)
Admission: EM | Admit: 2019-09-12 | Discharge: 2019-09-12 | Disposition: A | Discharge status: Home / Self Care | Payer: Medicaid Other | Attending: Emergency Medicine | Admitting: Emergency Medicine

## 2019-09-12 DIAGNOSIS — F17210 Nicotine dependence, cigarettes, uncomplicated: Secondary | ICD-10-CM | POA: Insufficient documentation

## 2019-09-12 DIAGNOSIS — N183 Chronic kidney disease, stage 3 unspecified: Secondary | ICD-10-CM | POA: Insufficient documentation

## 2019-09-12 DIAGNOSIS — M545 Low back pain, unspecified: Secondary | ICD-10-CM

## 2019-09-12 DIAGNOSIS — Z881 Allergy status to other antibiotic agents status: Secondary | ICD-10-CM | POA: Insufficient documentation

## 2019-09-12 MED ORDER — CYCLOBENZAPRINE HCL 10 MG PO TABS: 10.0000 mg | ORAL_TABLET | Freq: Three times a day (TID) | ORAL | 0 refills | Status: DC | PRN

## 2019-09-12 MED ORDER — OXYCODONE-ACETAMINOPHEN 5-325 MG PO TABS: 1.0000 | ORAL_TABLET | ORAL | 0 refills | Status: DC | PRN

## 2019-09-12 NOTE — ED Provider Notes (Signed)
[facility] DEPT [facility] Provider Note: Georgena Spurling, MD, FACEP  CSN: [id number] MRN: [id number] ARRIVAL: 09/12/19 at [place]: [place]  09/12/19 4:48 AM Adam Weaver is a 34 y.o. male with history of chronic back pain for which she is not normally treated.  He rolled over in bed about 30 minutes prior to arrival and felt the sudden onset of sharp pain in his mid lumbar region.  There is some intermittent radiation to his hips but not constantly.  He rates pain as an 8 out of 10, worse with movement.  He denies any associated numbness or weakness.  He denies bowel or bladder changes.   Past Medical History:  Diagnosis Date  . History of kidney stones   . Hypokalemia   . Hypokalemic periodic paralysis    POTASSIUM GETS LOW TROUBLE MOVING DAY AFTER SURGERY  . Kidney stones   . Renal disorder    STAGE 3 CKD, NO NEPHROLOGIST    Past Surgical History:  Procedure Laterality Date  . CYSTOSCOPY W/ URETERAL STENT PLACEMENT Right 06/21/2019   Procedure: CYSTOSCOPY WITH RIGHT URETERAL STENT PLACEMENT;  Surgeon: Robley Fries, MD;  Location: [facility];  Service: Urology;  Laterality: Right;  . CYSTOSCOPY WITH RETROGRADE PYELOGRAM, URETEROSCOPY AND STENT PLACEMENT Right 07/20/2019   Procedure: CYSTOSCOPY WITH RETROGRADE PYELOGRAM, URETEROSCOPY AND STENT PLACEMENT;  Surgeon: Robley Fries, MD;  Location: [facility];  Service: Urology;  Laterality: Right;  90 MINS  . HOLMIUM LASER APPLICATION Right 07/28/7865   Procedure: HOLMIUM LASER APPLICATION;  Surgeon: Robley Fries, MD;  Location: [facility];  Service: Urology;  Laterality: Right;  . LITHOTRIPSY    . PINS IN RIGHT ARM     LATER REMOVED    No family history on file.  Social History   Tobacco Use  . Smoking status: Current Every Day Smoker    Packs/day: 1.00    Years: 3.00    Pack years: 3.00    Types: Cigarettes,  E-cigarettes  . Smokeless tobacco: Never Used  Substance Use Topics  . Alcohol use: Yes    Comment: RARE  . Drug use: Never    Prior to Admission medications   Medication Sig Start Date End Date Taking? Authorizing Provider  cyclobenzaprine (FLEXERIL) 10 MG tablet Take 1 tablet (10 mg total) by mouth 3 (three) times daily as needed for muscle spasms. 09/12/19   Nalleli Largent, MD  oxyCODONE-acetaminophen (PERCOCET) 5-325 MG tablet Take 1 tablet by mouth every 4 (four) hours as needed for severe pain. 09/12/19   Keyante Durio, MD    Allergies Ciprofloxacin and Zithromax [azithromycin]   REVIEW OF SYSTEMS  Negative except as noted here or in the History of Present Illness.   PHYSICAL EXAMINATION  Initial Vital Signs Blood pressure 134/90, pulse 85, temperature 98 F (36.7 C), resp. rate 16, height 5\' 11"  (1.803 m), weight 117.9 kg, SpO2 97 %.  Examination General: Well-developed, well-nourished male in no acute distress; appearance consistent with age of record HENT: normocephalic; atraumatic Eyes: Normal appearance Neck: supple Heart: regular rate and rhythm Lungs: clear to auscultation bilaterally Abdomen: soft; nondistended; nontender; bowel sounds present Back: Mid lumbar tenderness with pain on movement of lower back Extremities: No deformity; full range of motion; pulses normal Neurologic: Awake, alert and oriented; motor function intact in all extremities and symmetric; sensation intact in lower extremities and symmetric; no facial droop Skin: Warm  and dry Psychiatric: Normal mood and affect   RESULTS  Summary of this visit's results, reviewed and interpreted by myself:   EKG Interpretation  Date/Time:    Ventricular Rate:    PR Interval:    QRS Duration:   QT Interval:    QTC Calculation:   R Axis:     Text Interpretation:        Laboratory Studies: No results found for this or any previous visit (from the past 24 hour(s)). Imaging Studies: No results  found.  ED COURSE and MDM  Nursing notes, initial and subsequent vitals signs, including pulse oximetry, reviewed and interpreted by myself.  Vitals:   09/12/19 0325 09/12/19 0326  BP: 134/90   Pulse: 85   Resp: 16   Temp: 98 F (36.7 C)   SpO2: 97%   Weight:  117.9 kg  Height:  5\' 11"  (1.803 m)   Medications - No data to display  Patient's mechanism of injury does not suggest the need for radiographs at this time.  PROCEDURES  Procedures   ED DIAGNOSES     ICD-10-CM   1. Acute midline low back pain without sciatica  M54.5        Krzysztof Reichelt, , MD 09/12/19 [phone]

## 2019-09-12 NOTE — ED Triage Notes (Signed)
Pt reports sudden onset back pain after twisting to get tv remote about 30 minutes ago. Reports lower central back pain. Also reporting vomiting d/t pain. Denies loss of control of bowel or bladder. Denies tingling or numbness in lower extremities.

## 2019-09-21 ENCOUNTER — Other Ambulatory Visit: Payer: Self-pay | Admitting: Urology

## 2019-09-22 ENCOUNTER — Other Ambulatory Visit (HOSPITAL_COMMUNITY): Payer: Self-pay | Admitting: Urology

## 2019-09-22 DIAGNOSIS — N200 Calculus of kidney: Secondary | ICD-10-CM

## 2019-10-14 NOTE — Patient Instructions (Signed)
DUE TO COVID-19 ONLY ONE VISITOR IS ALLOWED TO COME WITH YOU AND STAY IN THE WAITING ROOM ONLY DURING PRE OP AND PROCEDURE DAY OF SURGERY. THE 2 VISITORS MAY VISIT WITH YOU AFTER SURGERY IN YOUR PRIVATE ROOM DURING VISITING HOURS ONLY!  YOU NEED TO HAVE A COVID 19 TEST ON_4/23______ @__2 :40_____, THIS TEST MUST BE DONE BEFORE SURGERY, COME  [address], [place] , .  [facility]) ONCE YOUR COVID TEST IS COMPLETED, PLEASE BEGIN THE QUARANTINE INSTRUCTIONS AS OUTLINED IN YOUR HANDOUT.                Adam Weaver    Your procedure is scheduled on: 10/26/19   Report to [facility] Main  Entrance   Report to admitting at   9:00 AM     Call this number if you have problems the morning of surgery [phone]    Remember: Do not eat food or drink liquids :After Midnight.   BRUSH YOUR TEETH MORNING OF SURGERY AND RINSE YOUR MOUTH OUT, NO CHEWING GUM CANDY OR MINTS.     Take these medicines the morning of surgery with A SIP OF WATER: None                                You may not have any metal on your body including              piercings  Do not wear jewelry,  lotions, powders or  deodorant                      Men may shave face and neck.   Do not bring valuables to the hospital. [facility] IS NOT             RESPONSIBLE   FOR VALUABLES.  Contacts, dentures or bridgework may not be worn into surgery.                 Please read over the following fact sheets you were given: _____________________________________________________________________             [facility] - Preparing for Surgery Before surgery, you can play an important role.   Because skin is not sterile, your skin needs to be as free of germs as possible.   You can reduce the number of germs on your skin by washing with CHG (chlorahexidine gluconate) soap before surgery.   CHG is an antiseptic cleaner which kills germs and bonds with the skin to continue killing germs  even after washing. Please DO NOT use if you have an allergy to CHG or antibacterial soaps.   If your skin becomes reddened/irritated stop using the CHG and inform your nurse when you arrive at Short Stay.  You may shave your face/neck.  Please follow these instructions carefully:  1.  Shower with CHG Soap the night before surgery and the  morning of Surgery.  2.  If you choose to wash your hair, wash your hair first as usual with your  normal  shampoo.  3.  After you shampoo, rinse your hair and body thoroughly to remove the  shampoo.                                        4.  Use CHG as you  would any other liquid soap.  You can apply chg directly  to the skin and wash                       Gently with a scrungie or clean washcloth.  5.  Apply the CHG Soap to your body ONLY FROM THE NECK DOWN.   Do not use on face/ open                           Wound or open sores. Avoid contact with eyes, ears mouth and genitals (private parts).                       Wash face,  Genitals (private parts) with your normal soap.             6.  Wash thoroughly, paying special attention to the area where your surgery  will be performed.  7.  Thoroughly rinse your body with warm water from the neck down.  8.  DO NOT shower/wash with your normal soap after using and rinsing off  the CHG Soap.             9.  Pat yourself dry with a clean towel.            10.  Wear clean pajamas.            11.  Place clean sheets on your bed the night of your first shower and do not  sleep with pets. Day of Surgery : Do not apply any lotions/deodorants the morning of surgery.  Please wear clean clothes to the hospital/surgery center.  FAILURE TO FOLLOW THESE INSTRUCTIONS MAY RESULT IN THE CANCELLATION OF YOUR SURGERY PATIENT SIGNATURE_________________________________  NURSE SIGNATURE__________________________________  ________________________________________________________________________

## 2019-10-15 ENCOUNTER — Encounter (HOSPITAL_COMMUNITY)
Admission: RE | Admit: 2019-10-15 | Discharge: 2019-10-15 | Disposition: A | Discharge status: Home / Self Care | Payer: Medicaid Other | Source: Ambulatory Visit | Attending: Urology | Admitting: Urology

## 2019-10-15 ENCOUNTER — Encounter (HOSPITAL_COMMUNITY): Payer: Self-pay

## 2019-10-15 ENCOUNTER — Other Ambulatory Visit: Payer: Self-pay

## 2019-10-15 DIAGNOSIS — Z01812 Encounter for preprocedural laboratory examination: Secondary | ICD-10-CM | POA: Insufficient documentation

## 2019-10-15 LAB — CBC
HCT: 52.9 % — ABNORMAL HIGH (ref 39.0–52.0)
Hemoglobin: 17.5 g/dL — ABNORMAL HIGH (ref 13.0–17.0)
MCH: 29.6 pg (ref 26.0–34.0)
MCHC: 33.1 g/dL (ref 30.0–36.0)
MCV: 89.4 fL (ref 80.0–100.0)
Platelets: 306 10*3/uL (ref 150–400)
RBC: 5.92 MIL/uL — ABNORMAL HIGH (ref 4.22–5.81)
RDW: 15 % (ref 11.5–15.5)
WBC: 9.6 10*3/uL (ref 4.0–10.5)
nRBC: 0 % (ref 0.0–0.2)

## 2019-10-15 NOTE — Progress Notes (Signed)
PCP - none. New to area Cardiologist - none  Chest x-ray - no EKG - no Stress Test -no  ECHO - no Cardiac Cath - no  Sleep Study - no CPAP -   Fasting Blood Sugar - NA Checks Blood Sugar _____ times a day  Blood Thinner Instructions:NA Aspirin Instructions: Last Dose:  Anesthesia review:   Patient denies shortness of breath, fever, cough and chest pain at PAT appointment yes  Patient verbalized understanding of instructions that were given to them at the PAT appointment. Patient was also instructed that they will need to review over the PAT instructions again at home before surgery. yes

## 2019-10-22 ENCOUNTER — Other Ambulatory Visit (HOSPITAL_COMMUNITY)
Admission: RE | Admit: 2019-10-22 | Discharge: 2019-10-22 | Disposition: A | Discharge status: Home / Self Care | Payer: Medicaid Other | Source: Ambulatory Visit | Attending: Urology | Admitting: Urology

## 2019-10-22 DIAGNOSIS — Z01812 Encounter for preprocedural laboratory examination: Secondary | ICD-10-CM | POA: Diagnosis present

## 2019-10-22 DIAGNOSIS — Z20822 Contact with and (suspected) exposure to covid-19: Secondary | ICD-10-CM | POA: Insufficient documentation

## 2019-10-22 LAB — SARS CORONAVIRUS 2 (TAT 6-24 HRS): SARS Coronavirus 2: NEGATIVE

## 2019-10-25 ENCOUNTER — Other Ambulatory Visit: Payer: Self-pay | Admitting: Physician Assistant

## 2019-10-25 MED ORDER — GENTAMICIN SULFATE 40 MG/ML IJ SOLN
5.0000 mg/kg | INTRAVENOUS | Status: AC
  Administered 2019-10-26: 11:00:00 462 mg via INTRAVENOUS
  Filled 2019-10-25: qty 11.5

## 2019-10-25 NOTE — Addendum Note (Signed)
Addended by: Lynnette Caffey A on: 10/25/2019 09:17 AM   Modules accepted: Orders

## 2019-10-25 NOTE — H&P (Addendum)
CC/HPI: CC: Nephrolithiasis   HPI:  34 yo with a history of nephrocalcinosis and bilateral nephrolithiasis s/p right ureteral stent for ureteral stone 06/2019, then s/p right ureteroscopic stone extraction on 07/20/2019. Presents right PCNL for large residual right stone burden. Cr on 1/19 was 1.7.   ALLERGIES: azithromycin - Hives Ciprofloxacin - Anaphylaxis   MEDICATIONS: Percocet  Muscle Relaxer  Potassium    GU PSH: Cystoscopy Insert Stent, Right - 06/21/2019 Ureteroscopic laser litho, Right - 07/20/2019  NON-GU PSH: No Non-GU PSH    GU PMH: No GU PMH    NON-GU PMH: No Non-GU PMH    FAMILY HISTORY: No Family History    SOCIAL HISTORY: No Social History    GU PHYSICAL EXAMINATION 09/16/19:      Notes: No CVA tenderness bilaterally.   MULTI-SYSTEM PHYSICAL EXAMINATION:    Constitutional: Well-nourished. No physical deformities. Normally developed. Good grooming.  Neck: Neck symmetrical, not swollen. Normal tracheal position.  Respiratory: No labored breathing, no use of accessory muscles.   Cardiovascular: Normal temperature, normal extremity pulses, no swelling, no varicosities.  Lymphatic: No enlargement of neck, axillae, groin.  Skin: No paleness, no jaundice, no cyanosis. No lesion, no ulcer, no rash.  Neurologic / Psychiatric: Oriented to time, oriented to place, oriented to person. No depression, no anxiety, no agitation.  Gastrointestinal: No mass, no tenderness, no rigidity, non obese abdomen.  Eyes: Normal conjunctivae. Normal eyelids.  Ears, Nose, Mouth, and Throat: Left ear no scars, no lesions, no masses. Right ear no scars, no lesions, no masses. Nose no scars, no lesions, no masses. Normal hearing. Normal lips.  Musculoskeletal: Normal gait and station of head and neck.      ASSESSMENT:      ICD-10 Details  1 GU:   Renal calculus - N20.0    PLAN:    34 yo M with bilateral nephrolithiasis and plan for treatment of residual right stone burden with right  PCNL with VIR access prior.   Risks and benefits of procedure as well as expected postoperative course previously reviewed with patient. Possible need for further right sided procedures (second look PCNL or ureteroscopy) also reviewed. Preoperative urine culture and labs collected.   -Plan for VIR access and PCNL -Amp/Gent for antibiotics.

## 2019-10-26 ENCOUNTER — Inpatient Hospital Stay (HOSPITAL_COMMUNITY): Payer: Self-pay | Admitting: Certified Registered"

## 2019-10-26 ENCOUNTER — Other Ambulatory Visit: Payer: Self-pay

## 2019-10-26 ENCOUNTER — Ambulatory Visit (HOSPITAL_COMMUNITY)
Admission: RE | Admit: 2019-10-26 | Discharge: 2019-10-26 | Disposition: A | Discharge status: Home / Self Care | Payer: Self-pay | Source: Ambulatory Visit | Attending: Urology | Admitting: Urology

## 2019-10-26 ENCOUNTER — Encounter (HOSPITAL_COMMUNITY)
Admission: RE | Disposition: A | Discharge status: Home / Self Care | Payer: Self-pay | Source: Ambulatory Visit | Attending: Urology

## 2019-10-26 ENCOUNTER — Observation Stay (HOSPITAL_COMMUNITY)
Admission: RE | Admit: 2019-10-26 | Discharge: 2019-10-27 | Disposition: A | Discharge status: Home / Self Care | Payer: Self-pay | Source: Ambulatory Visit | Attending: Urology | Admitting: Urology

## 2019-10-26 ENCOUNTER — Inpatient Hospital Stay (HOSPITAL_COMMUNITY): Payer: Self-pay

## 2019-10-26 ENCOUNTER — Encounter (HOSPITAL_COMMUNITY): Payer: Self-pay | Admitting: Urology

## 2019-10-26 DIAGNOSIS — N202 Calculus of kidney with calculus of ureter: Principal | ICD-10-CM | POA: Insufficient documentation

## 2019-10-26 DIAGNOSIS — F17210 Nicotine dependence, cigarettes, uncomplicated: Secondary | ICD-10-CM | POA: Insufficient documentation

## 2019-10-26 DIAGNOSIS — Z466 Encounter for fitting and adjustment of urinary device: Secondary | ICD-10-CM | POA: Insufficient documentation

## 2019-10-26 DIAGNOSIS — Z87442 Personal history of urinary calculi: Secondary | ICD-10-CM | POA: Insufficient documentation

## 2019-10-26 DIAGNOSIS — N200 Calculus of kidney: Secondary | ICD-10-CM

## 2019-10-26 DIAGNOSIS — Z881 Allergy status to other antibiotic agents status: Secondary | ICD-10-CM | POA: Insufficient documentation

## 2019-10-26 DIAGNOSIS — N183 Chronic kidney disease, stage 3 unspecified: Secondary | ICD-10-CM | POA: Insufficient documentation

## 2019-10-26 HISTORY — PX: HOLMIUM LASER APPLICATION: SHX5852

## 2019-10-26 HISTORY — PX: NEPHROLITHOTOMY: SHX5134

## 2019-10-26 HISTORY — PX: IR URETERAL STENT RIGHT NEW ACCESS W/O SEP NEPHROSTOMY CATH: IMG6076

## 2019-10-26 LAB — BASIC METABOLIC PANEL
Anion gap: 11 (ref 5–15)
Anion gap: 7 (ref 5–15)
BUN: 15 mg/dL (ref 6–20)
BUN: 16 mg/dL (ref 6–20)
CO2: 17 mmol/L — ABNORMAL LOW (ref 22–32)
CO2: 20 mmol/L — ABNORMAL LOW (ref 22–32)
Calcium: 8.5 mg/dL — ABNORMAL LOW (ref 8.9–10.3)
Calcium: 8.2 mg/dL — ABNORMAL LOW (ref 8.9–10.3)
Chloride: 113 mmol/L — ABNORMAL HIGH (ref 98–111)
Chloride: 114 mmol/L — ABNORMAL HIGH (ref 98–111)
Creatinine, Ser: 1.9 mg/dL — ABNORMAL HIGH (ref 0.61–1.24)
Creatinine, Ser: 1.96 mg/dL — ABNORMAL HIGH (ref 0.61–1.24)
GFR calc Af Amer: 53 mL/min — ABNORMAL LOW (ref >60)
GFR calc Af Amer: 51 mL/min — ABNORMAL LOW (ref >60)
GFR calc non Af Amer: 45 mL/min — ABNORMAL LOW (ref >60)
GFR calc non Af Amer: 44 mL/min — ABNORMAL LOW (ref >60)
Glucose, Bld: 107 mg/dL — ABNORMAL HIGH (ref 70–99)
Glucose, Bld: 107 mg/dL — ABNORMAL HIGH (ref 70–99)
Potassium: 3.1 mmol/L — ABNORMAL LOW (ref 3.5–5.1)
Potassium: 3.4 mmol/L — ABNORMAL LOW (ref 3.5–5.1)
Sodium: 141 mmol/L (ref 135–145)
Sodium: 141 mmol/L (ref 135–145)

## 2019-10-26 LAB — CBC
HCT: 52.2 % — ABNORMAL HIGH (ref 39.0–52.0)
Hemoglobin: 17.7 g/dL — ABNORMAL HIGH (ref 13.0–17.0)
MCH: 29.6 pg (ref 26.0–34.0)
MCHC: 33.9 g/dL (ref 30.0–36.0)
MCV: 87.4 fL (ref 80.0–100.0)
Platelets: 317 10*3/uL (ref 150–400)
RBC: 5.97 MIL/uL — ABNORMAL HIGH (ref 4.22–5.81)
RDW: 14.1 % (ref 11.5–15.5)
WBC: 8.9 10*3/uL (ref 4.0–10.5)
nRBC: 0 % (ref 0.0–0.2)

## 2019-10-26 LAB — PROTIME-INR
INR: 1 (ref 0.8–1.2)
Prothrombin Time: 12.6 seconds (ref 11.4–15.2)

## 2019-10-26 LAB — HEMOGLOBIN AND HEMATOCRIT, BLOOD
HCT: 50.6 % (ref 39.0–52.0)
Hemoglobin: 16.8 g/dL (ref 13.0–17.0)

## 2019-10-26 SURGERY — NEPHROLITHOTOMY PERCUTANEOUS
Anesthesia: General | Site: Back | Laterality: Right

## 2019-10-26 MED ORDER — DIPHENHYDRAMINE HCL 12.5 MG/5ML PO ELIX: 12.5000 mg | ORAL_SOLUTION | Freq: Four times a day (QID) | ORAL | Status: DC | PRN

## 2019-10-26 MED ORDER — SCOPOLAMINE 1 MG/3DAYS TD PT72
1.0000 | MEDICATED_PATCH | TRANSDERMAL | Status: DC
  Administered 2019-10-26: 1.5 mg via TRANSDERMAL
  Filled 2019-10-26: qty 1

## 2019-10-26 MED ORDER — FENTANYL CITRATE (PF) 100 MCG/2ML IJ SOLN
INTRAMUSCULAR | Status: AC
  Filled 2019-10-26: qty 4

## 2019-10-26 MED ORDER — BUPIVACAINE HCL 0.25 % IJ SOLN
INTRAMUSCULAR | Status: AC
  Filled 2019-10-26: qty 1

## 2019-10-26 MED ORDER — MIDAZOLAM HCL 2 MG/2ML IJ SOLN
INTRAMUSCULAR | Status: AC | PRN
  Administered 2019-10-26 (×2): 1 mg via INTRAVENOUS
  Administered 2019-10-26: 2 mg via INTRAVENOUS

## 2019-10-26 MED ORDER — CEFAZOLIN SODIUM-DEXTROSE 2-4 GM/100ML-% IV SOLN
2.0000 g | Freq: Once | INTRAVENOUS | Status: AC
  Administered 2019-10-26: 2 g via INTRAVENOUS

## 2019-10-26 MED ORDER — FENTANYL CITRATE (PF) 250 MCG/5ML IJ SOLN
INTRAMUSCULAR | Status: AC
  Filled 2019-10-26: qty 5

## 2019-10-26 MED ORDER — CELECOXIB 200 MG PO CAPS
200.0000 mg | ORAL_CAPSULE | Freq: Once | ORAL | Status: AC
  Administered 2019-10-26: 09:00:00 200 mg via ORAL
  Filled 2019-10-26: qty 1

## 2019-10-26 MED ORDER — LIDOCAINE 2% (20 MG/ML) 5 ML SYRINGE
INTRAMUSCULAR | Status: AC
  Filled 2019-10-26: qty 5

## 2019-10-26 MED ORDER — MIDAZOLAM HCL 2 MG/2ML IJ SOLN
INTRAMUSCULAR | Status: AC
  Filled 2019-10-26: qty 6

## 2019-10-26 MED ORDER — SUCCINYLCHOLINE CHLORIDE 200 MG/10ML IV SOSY
PREFILLED_SYRINGE | INTRAVENOUS | Status: AC
  Filled 2019-10-26: qty 10

## 2019-10-26 MED ORDER — FENTANYL CITRATE (PF) 100 MCG/2ML IJ SOLN
INTRAMUSCULAR | Status: DC | PRN
  Administered 2019-10-26: 50 ug via INTRAVENOUS
  Administered 2019-10-26: 25 ug via INTRAVENOUS
  Administered 2019-10-26 (×2): 50 ug via INTRAVENOUS
  Administered 2019-10-26: 25 ug via INTRAVENOUS
  Administered 2019-10-26: 50 ug via INTRAVENOUS

## 2019-10-26 MED ORDER — DOCUSATE SODIUM 100 MG PO CAPS
100.0000 mg | ORAL_CAPSULE | Freq: Two times a day (BID) | ORAL | Status: DC
  Administered 2019-10-26 – 2019-10-27 (×2): 100 mg via ORAL
  Filled 2019-10-26 (×2): qty 1

## 2019-10-26 MED ORDER — ACETAMINOPHEN 500 MG PO TABS
1000.0000 mg | ORAL_TABLET | Freq: Once | ORAL | Status: AC
  Administered 2019-10-26: 1000 mg via ORAL
  Filled 2019-10-26: qty 2

## 2019-10-26 MED ORDER — ROCURONIUM BROMIDE 10 MG/ML (PF) SYRINGE
PREFILLED_SYRINGE | INTRAVENOUS | Status: DC | PRN
  Administered 2019-10-26: 50 mg via INTRAVENOUS
  Administered 2019-10-26: 20 mg via INTRAVENOUS

## 2019-10-26 MED ORDER — MIDAZOLAM HCL 2 MG/2ML IJ SOLN
INTRAMUSCULAR | Status: AC
  Filled 2019-10-26: qty 2

## 2019-10-26 MED ORDER — OXYCODONE HCL 5 MG PO TABS
5.0000 mg | ORAL_TABLET | ORAL | Status: DC | PRN
  Administered 2019-10-27 (×2): 10 mg via ORAL
  Filled 2019-10-26 (×2): qty 2

## 2019-10-26 MED ORDER — LIDOCAINE HCL (PF) 1 % IJ SOLN
INTRAMUSCULAR | Status: AC | PRN
  Administered 2019-10-26: 10 mL

## 2019-10-26 MED ORDER — SUCCINYLCHOLINE CHLORIDE 200 MG/10ML IV SOSY
PREFILLED_SYRINGE | INTRAVENOUS | Status: DC | PRN
  Administered 2019-10-26: 120 mg via INTRAVENOUS

## 2019-10-26 MED ORDER — PHENYLEPHRINE HCL (PRESSORS) 10 MG/ML IV SOLN
INTRAVENOUS | Status: AC
  Filled 2019-10-26: qty 1

## 2019-10-26 MED ORDER — SODIUM CHLORIDE 0.9 % IV SOLN: INTRAVENOUS | Status: DC

## 2019-10-26 MED ORDER — LIDOCAINE HCL 1 % IJ SOLN
INTRAMUSCULAR | Status: AC
  Filled 2019-10-26: qty 20

## 2019-10-26 MED ORDER — ACETAMINOPHEN 500 MG PO TABS
1000.0000 mg | ORAL_TABLET | Freq: Four times a day (QID) | ORAL | Status: DC
  Administered 2019-10-26 – 2019-10-27 (×4): 1000 mg via ORAL
  Filled 2019-10-26 (×4): qty 2

## 2019-10-26 MED ORDER — SODIUM CHLORIDE 0.9 % IR SOLN
Status: DC | PRN
  Administered 2019-10-26: 12000 mL via INTRAVESICAL

## 2019-10-26 MED ORDER — PROMETHAZINE HCL 25 MG/ML IJ SOLN: 6.2500 mg | INTRAMUSCULAR | Status: DC | PRN

## 2019-10-26 MED ORDER — PHENYLEPHRINE 40 MCG/ML (10ML) SYRINGE FOR IV PUSH (FOR BLOOD PRESSURE SUPPORT)
PREFILLED_SYRINGE | INTRAVENOUS | Status: DC | PRN
  Administered 2019-10-26 (×2): 80 ug via INTRAVENOUS

## 2019-10-26 MED ORDER — BUPIVACAINE HCL (PF) 0.25 % IJ SOLN
INTRAMUSCULAR | Status: DC | PRN
  Administered 2019-10-26: 30 mL

## 2019-10-26 MED ORDER — FENTANYL CITRATE (PF) 100 MCG/2ML IJ SOLN: 25.0000 ug | INTRAMUSCULAR | Status: DC | PRN

## 2019-10-26 MED ORDER — PHENYLEPHRINE 40 MCG/ML (10ML) SYRINGE FOR IV PUSH (FOR BLOOD PRESSURE SUPPORT)
PREFILLED_SYRINGE | INTRAVENOUS | Status: AC
  Filled 2019-10-26: qty 10

## 2019-10-26 MED ORDER — ONDANSETRON HCL 4 MG/2ML IJ SOLN
INTRAMUSCULAR | Status: DC | PRN
  Administered 2019-10-26: 4 mg via INTRAVENOUS

## 2019-10-26 MED ORDER — DIPHENHYDRAMINE HCL 50 MG/ML IJ SOLN: 12.5000 mg | Freq: Four times a day (QID) | INTRAMUSCULAR | Status: DC | PRN

## 2019-10-26 MED ORDER — LACTATED RINGERS IV SOLN: INTRAVENOUS | Status: DC

## 2019-10-26 MED ORDER — SENNA 8.6 MG PO TABS
1.0000 | ORAL_TABLET | Freq: Every day | ORAL | Status: DC
  Administered 2019-10-26 – 2019-10-27 (×2): 8.6 mg via ORAL
  Filled 2019-10-26 (×2): qty 1

## 2019-10-26 MED ORDER — LIDOCAINE 2% (20 MG/ML) 5 ML SYRINGE
INTRAMUSCULAR | Status: DC | PRN
  Administered 2019-10-26: 80 mg via INTRAVENOUS

## 2019-10-26 MED ORDER — DEXAMETHASONE SODIUM PHOSPHATE 10 MG/ML IJ SOLN
INTRAMUSCULAR | Status: DC | PRN
  Administered 2019-10-26: 10 mg via INTRAVENOUS

## 2019-10-26 MED ORDER — FENTANYL CITRATE (PF) 100 MCG/2ML IJ SOLN
INTRAMUSCULAR | Status: AC
  Administered 2019-10-26: 50 ug via INTRAVENOUS
  Filled 2019-10-26: qty 2

## 2019-10-26 MED ORDER — MORPHINE SULFATE (PF) 2 MG/ML IV SOLN
1.0000 mg | INTRAVENOUS | Status: DC | PRN
  Administered 2019-10-26: 1 mg via INTRAVENOUS
  Administered 2019-10-26 – 2019-10-27 (×3): 2 mg via INTRAVENOUS
  Filled 2019-10-26 (×4): qty 1

## 2019-10-26 MED ORDER — DEXTROSE-NACL 5-0.45 % IV SOLN: INTRAVENOUS | Status: DC

## 2019-10-26 MED ORDER — PROPOFOL 10 MG/ML IV BOLUS
INTRAVENOUS | Status: DC | PRN
  Administered 2019-10-26: 200 mg via INTRAVENOUS

## 2019-10-26 MED ORDER — GENTAMICIN SULFATE 40 MG/ML IJ SOLN
5.0000 mg/kg | Freq: Once | INTRAVENOUS | Status: AC
  Administered 2019-10-27: 460 mg via INTRAVENOUS
  Filled 2019-10-26: qty 11.5

## 2019-10-26 MED ORDER — FENTANYL CITRATE (PF) 100 MCG/2ML IJ SOLN
INTRAMUSCULAR | Status: AC | PRN
  Administered 2019-10-26 (×3): 50 ug via INTRAVENOUS

## 2019-10-26 MED ORDER — CEFAZOLIN SODIUM-DEXTROSE 2-4 GM/100ML-% IV SOLN
INTRAVENOUS | Status: AC
  Filled 2019-10-26: qty 100

## 2019-10-26 MED ORDER — EPHEDRINE 5 MG/ML INJ
INTRAVENOUS | Status: AC
  Filled 2019-10-26: qty 10

## 2019-10-26 MED ORDER — IOHEXOL 300 MG/ML  SOLN
50.0000 mL | Freq: Once | INTRAMUSCULAR | Status: AC | PRN
  Administered 2019-10-26: 15 mL

## 2019-10-26 MED ORDER — ESMOLOL HCL 100 MG/10ML IV SOLN
INTRAVENOUS | Status: AC
  Filled 2019-10-26: qty 10

## 2019-10-26 MED ORDER — SODIUM CHLORIDE 0.9 % IV SOLN: INTRAVENOUS | Status: DC | PRN

## 2019-10-26 MED ORDER — DEXAMETHASONE SODIUM PHOSPHATE 10 MG/ML IJ SOLN
INTRAMUSCULAR | Status: AC
  Filled 2019-10-26: qty 1

## 2019-10-26 MED ORDER — MIDAZOLAM HCL 5 MG/5ML IJ SOLN
INTRAMUSCULAR | Status: DC | PRN
  Administered 2019-10-26: 2 mg via INTRAVENOUS

## 2019-10-26 MED ORDER — CEFAZOLIN SODIUM-DEXTROSE 1-4 GM/50ML-% IV SOLN
1.0000 g | Freq: Three times a day (TID) | INTRAVENOUS | Status: AC
  Administered 2019-10-26 – 2019-10-27 (×3): 1 g via INTRAVENOUS
  Filled 2019-10-26 (×4): qty 50

## 2019-10-26 MED ORDER — CHLORHEXIDINE GLUCONATE CLOTH 2 % EX PADS
6.0000 | MEDICATED_PAD | Freq: Every day | CUTANEOUS | Status: DC
  Administered 2019-10-27: 6 via TOPICAL

## 2019-10-26 MED ORDER — SUGAMMADEX SODIUM 200 MG/2ML IV SOLN
INTRAVENOUS | Status: DC | PRN
  Administered 2019-10-26: 250 mg via INTRAVENOUS

## 2019-10-26 MED ORDER — SODIUM CHLORIDE 0.9 % IV SOLN
1.0000 g | INTRAVENOUS | Status: DC
  Filled 2019-10-26: qty 1000

## 2019-10-26 MED ORDER — ONDANSETRON HCL 4 MG/2ML IJ SOLN
INTRAMUSCULAR | Status: AC
  Filled 2019-10-26: qty 2

## 2019-10-26 SURGICAL SUPPLY — 55 items
ADAPTER GOLDBERG URETERAL (ADAPTER) ×3 IMPLANT
BAG URINE DRAIN 2000ML AR STRL (UROLOGICAL SUPPLIES) ×3 IMPLANT
BASKET LASER NITINOL 1.9FR (BASKET) ×3 IMPLANT
BASKET STONE NITINOL 3FRX115MB (UROLOGICAL SUPPLIES) IMPLANT
BASKET ZERO TIP NITINOL 2.4FR (BASKET) ×3 IMPLANT
BENZOIN TINCTURE PRP APPL 2/3 (GAUZE/BANDAGES/DRESSINGS) ×3 IMPLANT
BLADE SURG 15 STRL LF DISP TIS (BLADE) ×1 IMPLANT
BLADE SURG 15 STRL SS (BLADE) ×2
BOOTIES KNEE HIGH SLOAN (MISCELLANEOUS) ×3 IMPLANT
CATH FOLEY 2W COUNCIL 20FR 5CC (CATHETERS) ×3 IMPLANT
CATH FOLEY 2WAY SLVR  5CC 18FR (CATHETERS) ×2
CATH FOLEY 2WAY SLVR 5CC 18FR (CATHETERS) ×1 IMPLANT
CATH IMAGER II 65CM (CATHETERS) IMPLANT
CATH ROBINSON RED A/P 20FR (CATHETERS) IMPLANT
CATH URET 5FR 28IN OPEN ENDED (CATHETERS) IMPLANT
CATH URET DUAL LUMEN 6-10FR 50 (CATHETERS) ×6 IMPLANT
CATH UROLOGY TORQUE 40 (MISCELLANEOUS) IMPLANT
CATH UROLOGY TORQUE 65 (CATHETERS) ×3 IMPLANT
CATH X-FORCE N30 NEPHROSTOMY (TUBING) ×3 IMPLANT
CHLORAPREP W/TINT 26 (MISCELLANEOUS) ×3 IMPLANT
COVER SURGICAL LIGHT HANDLE (MISCELLANEOUS) ×3 IMPLANT
COVER WAND RF STERILE (DRAPES) IMPLANT
DRAPE C-ARM 42X120 X-RAY (DRAPES) ×3 IMPLANT
DRAPE LINGEMAN PERC (DRAPES) ×3 IMPLANT
DRAPE SURG IRRIG POUCH 19X23 (DRAPES) ×3 IMPLANT
DRSG PAD ABDOMINAL 8X10 ST (GAUZE/BANDAGES/DRESSINGS) ×3 IMPLANT
DRSG TEGADERM 8X12 (GAUZE/BANDAGES/DRESSINGS) ×3 IMPLANT
FIBER LASER FLEXIVA 365 (UROLOGICAL SUPPLIES) ×3 IMPLANT
FIBER LASER TRAC TIP (UROLOGICAL SUPPLIES) ×3 IMPLANT
GAUZE SPONGE 4X4 12PLY STRL (GAUZE/BANDAGES/DRESSINGS) ×3 IMPLANT
GLOVE BIO SURGEON STRL SZ 6.5 (GLOVE) ×2 IMPLANT
GLOVE BIO SURGEONS STRL SZ 6.5 (GLOVE) ×1
GOWN STRL REUS W/TWL LRG LVL3 (GOWN DISPOSABLE) ×3 IMPLANT
GUIDEWIRE AMPLAZ .035X145 (WIRE) ×3 IMPLANT
GUIDEWIRE SENSOR ANG DUAL FLEX (WIRE) ×3 IMPLANT
GUIDEWIRE STR DUAL SENSOR (WIRE) IMPLANT
KIT BASIN (CUSTOM PROCEDURE TRAY) ×3 IMPLANT
KIT PROBE 340X3.4XDISP GRN (MISCELLANEOUS) IMPLANT
KIT PROBE TRILOGY 3.4X340 (MISCELLANEOUS)
KIT PROBE TRILOGY 3.9X350 (MISCELLANEOUS) ×3 IMPLANT
KIT TURNOVER KIT A (KITS) IMPLANT
MANIFOLD NEPTUNE II (INSTRUMENTS) ×3 IMPLANT
NS IRRIG 1000ML POUR BTL (IV SOLUTION) ×3 IMPLANT
SPONGE LAP 4X18 RFD (DISPOSABLE) ×3 IMPLANT
SUT SILK 2 0 30  PSL (SUTURE)
SUT SILK 2 0 30 PSL (SUTURE) IMPLANT
SYR 20ML LL LF (SYRINGE) ×3 IMPLANT
TOWEL OR 17X26 10 PK STRL BLUE (TOWEL DISPOSABLE) ×3 IMPLANT
TRAY CYSTO PACK (CUSTOM PROCEDURE TRAY) ×3 IMPLANT
TRAY FOLEY MTR SLVR 16FR STAT (SET/KITS/TRAYS/PACK) ×3 IMPLANT
TUBING CONNECTING 10 (TUBING) ×2 IMPLANT
TUBING CONNECTING 10' (TUBING) ×1
TUBING STONE CATCHER TRILOGY (MISCELLANEOUS) ×3 IMPLANT
TUBING UROLOGY SET (TUBING) ×3 IMPLANT
WATER STERILE IRR 1000ML POUR (IV SOLUTION) ×3 IMPLANT

## 2019-10-26 NOTE — Anesthesia Procedure Notes (Signed)
Procedure Name: Intubation Date/Time: 10/26/2019 10:57 AM Performed by: Silas Sacramento, CRNA Pre-anesthesia Checklist: Patient identified, Emergency Drugs available, Suction available and Patient being monitored Patient Re-evaluated:Patient Re-evaluated prior to induction Oxygen Delivery Method: Circle system utilized Preoxygenation: Pre-oxygenation with 100% oxygen Induction Type: IV induction and Rapid sequence Laryngoscope Size: Mac and 4 Grade View: Grade I Tube type: Oral Tube size: 7.5 mm Number of attempts: 1 Airway Equipment and Method: Stylet Placement Confirmation: ETT inserted through vocal cords under direct vision,  positive ETCO2 and breath sounds checked- equal and bilateral Secured at: 22 cm Tube secured with: Tape Dental Injury: Teeth and Oropharynx as per pre-operative assessment

## 2019-10-26 NOTE — H&P (Signed)
Chief Complaint: Patient was seen in consultation today for right ureteral stent placement.  Referring Physician(s): Pace,Maryellen D  Supervising Physician: Richarda Overlie  Patient Status: [facility] - Out-pt  History of Present Illness: Adam Weaver is a 34 y.o. male with a past medical history significant for CKD, neprhocalcinosis and right ureteral calculus who presents today for a right ureteral stent placement for percutaneous lithotripsy with urology later today. Adam Weaver presented to [facility] on 06/21/19 with complaints of right flank pain and was found to have a 1.3 cm right UPJ calculus - urology was consulted and he underwent cystoscopy with right ureteral stent placement later that day with Dr. Arita Miss. During the procedure purulent efflux was noted after stent placement, he was given PO antibiotics and planned for a second surgery in about 3 weeks. He underwent cystourethroscopy with right ureteral stent removal, right ureteroscopic stone extraction with lase lithotripsy and new right ureteral stent placement - during the procedure it was noted that minimal progress was made regarding the fragmenting of the stone over 1 hour and decision was made to place the new ureteral stent and plan for PCNL at a future date. IR has been asked to place percutaneous access prior to PCNL with urology later today.   Adam Weaver denies any pain today, he is just very anxious about his upcoming procedures. He states that last time he had an operation his potassium plummeted and he needed to go to the ER to have it replaced, so he is worried this will happen again but is hopeful this will not be the case as everyone is already aware of this complication from last time. He states understanding of the IR procedure and is agreeable to proceed.  Past Medical History:  Diagnosis Date  . History of kidney stones   . Hypokalemia   . Hypokalemic periodic paralysis    POTASSIUM GETS LOW TROUBLE MOVING DAY AFTER SURGERY   . Kidney stones   . Renal disorder    STAGE 3 CKD, NO NEPHROLOGIST    Past Surgical History:  Procedure Laterality Date  . CYSTOSCOPY W/ URETERAL STENT PLACEMENT Right 06/21/2019   Procedure: CYSTOSCOPY WITH RIGHT URETERAL STENT PLACEMENT;  Surgeon: Noel Christmas, MD;  Location: [facility];  Service: Urology;  Laterality: Right;  . CYSTOSCOPY WITH RETROGRADE PYELOGRAM, URETEROSCOPY AND STENT PLACEMENT Right 07/20/2019   Procedure: CYSTOSCOPY WITH RETROGRADE PYELOGRAM, URETEROSCOPY AND STENT PLACEMENT;  Surgeon: Noel Christmas, MD;  Location: [facility];  Service: Urology;  Laterality: Right;  90 MINS  . HOLMIUM LASER APPLICATION Right 07/20/2019   Procedure: HOLMIUM LASER APPLICATION;  Surgeon: Noel Christmas, MD;  Location: [facility];  Service: Urology;  Laterality: Right;  . LITHOTRIPSY    . PINS IN RIGHT ARM     LATER REMOVED    Allergies: Ciprofloxacin and Zithromax [azithromycin]  Medications: Prior to Admission medications   Medication Sig Start Date End Date Taking? Authorizing Provider  Aspirin-Caffeine (BC FAST PAIN RELIEF) 845-65 MG PACK Take 1 packet by mouth daily as needed (headache/back pain.).    [provider]  cyclobenzaprine (FLEXERIL) 10 MG tablet Take 1 tablet (10 mg total) by mouth 3 (three) times daily as needed for muscle spasms. Patient not taking: Reported on 10/05/2019 09/12/19   Molpus, [name], MD  naproxen sodium (ALEVE) 220 MG tablet Take 440 mg by mouth daily as needed.    [provider]  oxyCODONE-acetaminophen (PERCOCET) 5-325 MG tablet Take 1 tablet by mouth  every 4 (four) hours as needed for severe pain. Patient not taking: Reported on 10/05/2019 09/12/19   Molpus, [name], MD  Potassium 99 MG TABS Take 891 mg by mouth at bedtime.     [provider]     No family history on file.  Social History   Socioeconomic History  . Marital status: Significant Other    Spouse name: Not on file  .  Number of children: Not on file  . Years of education: Not on file  . Highest education level: Not on file  Occupational History  . Not on file  Tobacco Use  . Smoking status: Current Every Day Smoker    Packs/day: 1.00    Years: 3.00    Pack years: 3.00    Types: Cigarettes, E-cigarettes  . Smokeless tobacco: Never Used  Substance and Sexual Activity  . Alcohol use: Yes    Comment: RARE  . Drug use: Never  . Sexual activity: Not on file  Other Topics Concern  . Not on file  Social History Narrative  . Not on file   Social Determinants of Health   Financial Resource Strain:   . Difficulty of Paying Living Expenses:   Food Insecurity:   . Worried About Charity fundraiser in the Last Year:   . Arboriculturist in the Last Year:   Transportation Needs:   . Film/video editor (Medical):   Marland Kitchen Lack of Transportation (Non-Medical):   Physical Activity:   . Days of Exercise per Week:   . Minutes of Exercise per Session:   Stress:   . Feeling of Stress :   Social Connections:   . Frequency of Communication with Friends and Family:   . Frequency of Social Gatherings with Friends and Family:   . Attends Religious Services:   . Active Member of Clubs or Organizations:   . Attends Archivist Meetings:   Marland Kitchen Marital Status:      Review of Systems: A 12 point ROS discussed and pertinent positives are indicated in the HPI above.  All other systems are negative.  Review of Systems  Constitutional: Negative for appetite change, chills and fever.  Respiratory: Negative for cough and shortness of breath.   Cardiovascular: Negative for chest pain.  Gastrointestinal: Negative for abdominal pain, diarrhea, nausea and vomiting.  Genitourinary: Negative for dysuria, flank pain and hematuria.  Musculoskeletal: Negative for back pain.  Neurological: Negative for dizziness and light-headedness.  Psychiatric/Behavioral: The patient is nervous/anxious.     Vital Signs: BP  (!) 142/93 (BP Location: Right Arm)   Pulse 87   Temp 98.1 F (36.7 C) (Oral)   Resp 18   SpO2 95%   Physical Exam Vitals reviewed.  Constitutional:      General: He is not in acute distress. HENT:     Head: Normocephalic.     Mouth/Throat:     Mouth: Mucous membranes are moist.     Pharynx: Oropharynx is clear. No oropharyngeal exudate or posterior oropharyngeal erythema.  Cardiovascular:     Rate and Rhythm: Normal rate and regular rhythm.  Pulmonary:     Effort: Pulmonary effort is normal.     Breath sounds: Normal breath sounds.  Abdominal:     General: There is no distension.     Palpations: Abdomen is soft.     Tenderness: There is no abdominal tenderness.  Skin:    General: Skin is warm and dry.  Neurological:  Mental Status: He is alert and oriented to person, place, and time.  Psychiatric:        Mood and Affect: Mood normal.        Behavior: Behavior normal.        Thought Content: Thought content normal.        Judgment: Judgment normal.      MD Evaluation Airway: WNL Heart: WNL Abdomen: WNL Chest/ Lungs: WNL ASA  Classification: 2 Mallampati/Airway Score: One   Imaging: No results found.  Labs:  CBC: Recent Labs    06/22/19 0652 06/22/19 0652 06/23/19 0605 06/24/19 0554 07/20/19 0958 10/15/19 1324  WBC 13.8*  --  13.7* 9.2  --  9.6  HGB 17.2*   < > 14.5 13.6 18.4* 17.5*  HCT 51.9   < > 45.0 41.7 54.0* 52.9*  PLT 238  --  221 241  --  306   < > = values in this interval not displayed.    COAGS: No results for input(s): INR, APTT in the last 8760 hours.  BMP: Recent Labs    06/22/19 1728 06/22/19 1728 06/23/19 0605 06/23/19 1812 06/24/19 0554 07/20/19 0958  NA 141   < > 143 144 142 146*  K 2.6*   < > 2.3* 3.4* 3.3* 3.4*  CL 118*   < > 124* 121* 120* 114*  CO2 12*  --  10* 14* 15*  --   GLUCOSE 102*   < > 106* 98 98 103*  BUN 24*   < > 25* 27* 23* 20  CALCIUM 8.2*  --  8.4* 8.3* 8.0*  --   CREATININE 1.96*   < > 2.02*  1.99* 1.82* 1.70*  GFRNONAA 44*  --  42* 43* 48*  --   GFRAA 51*  --  49* 50* 56*  --    < > = values in this interval not displayed.    LIVER FUNCTION TESTS: Recent Labs    06/21/19 1225 06/22/19 0652 06/22/19 1728 06/23/19 0605  BILITOT 1.1 1.0 0.6 0.9  AST 17 14* 16 17  ALT 25 23 20 18   ALKPHOS 105 90 80 67  PROT 7.0 7.2 6.6 5.9*  ALBUMIN 4.2 4.1 3.5 3.1*    TUMOR MARKERS: No results for input(s): AFPTM, CEA, CA199, CHROMGRNA in the last 8760 hours.  Assessment and Plan:  34 y/o M with history of right ureteral calculus planned for PCNL today with urology - IR has been asked to place percutaneous access for this procedure.   Patient has been NPO since midnight, no blood thinning medications noted.   Risks and benefits of right percutaneous nephroureteral stent placement was discussed with the patient including, but not limited to, infection, bleeding, significant bleeding causing loss or decrease in renal function or damage to adjacent structures.   All of the patient's questions were answered, patient is agreeable to proceed.  Consent signed and in chart.   Thank you for this interesting consult.  I greatly enjoyed meeting Collin Weaver and look forward to participating in their care.  A copy of this report was sent to the requesting provider on this date.  Electronically Signed: Merton Border, PA-C 10/26/2019, 8:27 AM   I spent a total of  30 Minutes in face to face in clinical consultation, greater than 50% of which was counseling/coordinating care for right nephroureteral stent placement.

## 2019-10-26 NOTE — Anesthesia Postprocedure Evaluation (Signed)
Anesthesia Post Note  Patient: Ned Kakar  Procedure(s) Performed: NEPHROLITHOTOMY PERCUTANEOUS (Right Back) HOLMIUM LASER APPLICATION (Right Back)     Patient location during evaluation: PACU Anesthesia Type: General Level of consciousness: sedated Pain management: pain level controlled Vital Signs Assessment: post-procedure vital signs reviewed and stable Respiratory status: spontaneous breathing and respiratory function stable Cardiovascular status: stable Postop Assessment: no apparent nausea or vomiting Anesthetic complications: no    Last Vitals:  Vitals:   10/26/19 1315 10/26/19 1330  BP: 130/83 135/83  Pulse: 76 76  Resp: 12 13  Temp:  36.8 C  SpO2: 94% 94%    Last Pain:  Vitals:   10/26/19 1330  PainSc: 3                  Naija Troost DANIEL

## 2019-10-26 NOTE — Interval H&P Note (Signed)
History and Physical Interval Note:  10/26/2019 8:54 AM  Adam Weaver  has presented today for surgery, with the diagnosis of NEPHROLITHIASIS.  The various methods of treatment have been discussed with the patient and family. After consideration of risks, benefits and other options for treatment, the patient has consented to  Procedure(s) with comments: NEPHROLITHOTOMY PERCUTANEOUS (Right) - 3 HRS HOLMIUM LASER APPLICATION (Right) as a surgical intervention.  The patient's history has been reviewed, patient examined, no change in status, stable for surgery.  I have reviewed the patient's chart and labs.  Questions were answered to the patient's satisfaction.     Chai Verdejo D Madyson Lukach

## 2019-10-26 NOTE — Transfer of Care (Signed)
Immediate Anesthesia Transfer of Care Note  Patient: Adam Weaver  Procedure(s) Performed: NEPHROLITHOTOMY PERCUTANEOUS (Right Back) HOLMIUM LASER APPLICATION (Right Back)  Patient Location: PACU  Anesthesia Type:General  Level of Consciousness: awake, drowsy, patient cooperative and responds to stimulation  Airway & Oxygen Therapy: Patient Spontanous Breathing and Patient connected to face mask oxygen  Post-op Assessment: Report given to RN and Post -op Vital signs reviewed and stable  Post vital signs: Reviewed and stable  Last Vitals:  Vitals Value Taken Time  BP 125/94 10/26/19 1247  Temp    Pulse 88 10/26/19 1249  Resp 19 10/26/19 1249  SpO2 94 % 10/26/19 1249  Vitals shown include unvalidated device data.  Last Pain:  Vitals:   10/26/19 1020  PainSc: 0-No pain         Complications: No apparent anesthesia complications

## 2019-10-26 NOTE — Op Note (Addendum)
Operative Note  Preoperative diagnosis:  1. Multiple Right Renal calculus 2. Right ureteral calculus  Postoperative diagnosis: 1. Multiple Right Renal calculus  2. Right ureteral calculus  Procedure(s): 1. RIGHT Percutaneous nephrolithotomy 2. RIGHT ureteral stent removal 3. RIGHT antegrade nephrostogram 4. RIGHT percutaneous nephrostomy tube placement.   Surgeon: Kasandra Knudsen, MD  Assistants: Lars Pinks, MD  Anesthesia: General  Complications: None immediate  EBL: 50 cc  Specimens: 1.  Renal calculus for analysis 2.  Stone for gram stain and culture  Drains/Catheters: 1. Foley 2. 5Fr Kumpe in ureter 3. 20Fr Council tip in renal pelvis  Intraoperative findings:  Right ureteral stent in place Large canary-colored stone in the renal pelvis, small soft appearing stone in the lower pole, moderately sized stone in proximal ureter, impacted Small stones within the upper and interpolar regions some of which were submucosal, free stones that were free within the collecting system were removed, those that were submucosal were not otherwise visualized due to Randall's plaque were not removed or manipulated. Successful percutaneous nephrostolithotomy of all stone burden. Removal of right ureteral stent under direct visualization. Placement of 5 French catheter antegrade down the ureter, 20 [place] council tip catheter into the renal pelvis with 3 cc of contrast in the balloon.  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  Interventional radiology placed percutaneous nephrostomy tube earlier this morning.  He now presents for definitive stone removal on the right.   Description of procedure:  The patient was identified and consent was  obtained.  The patient was taken to the operating room and placed in the supine position.  The patient was placed under general anesthesia.  Perioperative antibiotics were administered.  The patient was placed in prone position and all pressure points were padded.  Patient was prepped and draped in a standard sterile fashion and a timeout was performed.  A Super Stiff wire was advanced through the nephroureteral stent down to the bladder under fluoroscopic guidance and the nephroureteral stent was removed.  A dual-lumen ureteral catheter was advanced over the Super Stiff wire into the renal pelvis and an antegrade nephrostogram was performed.  This showed a well opacified kidney and a filling defect corresponding to the stones of interest.  I advanced the dual-lumen ureteral catheter into the proximal ureter under fluoroscopic guidance followed by placement of a second Super Stiff wire down to the bladder under fluoroscopic guidance.  The dual-lumen catheter was removed.  An incision was made alongside the wires.  The balloon dilator was then advanced over one of the wires and into the renal pelvis fluoroscopic guidance and the tract was dilated to a pressure of 18.  The sheath was advanced over the balloon and into the renal pelvis.  The balloon was withdrawn keeping the sheath in place.  The nephroscope was advanced into the kidney and the stone of interest was encountered in the renal pelvis.  The stone was then removed with a combination of pneumatic and ultrasound with suction.  There is a subsequent stone in the lower pole which was treated the same.  There is a small amount of stone burden in the upper pole which was free.  This was also treated.  Upon exploration of the UPJ, there was a proximal ureteral stone that was removed in its entirety using a rigid  grasper.  The existing ureteral stent was removed with a rigid grasper under direct visualization and noted to be intact.  There had been encrustation  on the stent, therefore flexible ureteroscope was used in an antegrade fashion to ensure the ureter was clear.  There were approximately 3 stones in the ureter which were basket extracted.  We did visualize Foley catheter balloon signifying that the entire ureter had been explored and there is no residual stone burden.  Additionally, Murlean Caller was performed which demonstrated no evidence of other stones within the kidney.  Of note, consistent with his known disease, there is extensive amount of Randall's plaque/small submucosal stones which were appropriately not intervened upon.   At this point, 5 [place] open-ended catheter was advanced down the second sensor wire to the level of the bladder under fluoroscopic guidance.  A 20 [place] council tip catheter was Durene Fruits over the second wire to level the renal pelvis under direct fluoroscopic guidance.  Approximately 3 cc of contrast were instilled into the nephrostomy tube balloon.  Fluoroscopic images confirmed excellent position of both.  The sheath was subsequently removed.  There was no excessive bleeding from the catheter or from the incision.    At this point, an antegrade nephrostogram was performed with no obvious extravasation of contrast noted or filling defects.  The Council tip as well as the 5 [place] open-ended catheter were both secured to the skin using a silk suture.  Local anesthetic was infiltrated into the subcutaneous space at the level incision.  This concluded the operation.  The patient tolerated the procedure well and was stable postoperatively.  Both catheters were placed to gravity drainage bag.  Foley catheter was also to gravity drainage.    Plan: Patient will remain under observation overnight.

## 2019-10-26 NOTE — Procedures (Signed)
Interventional Radiology Procedure:   Indications: Nephrolithiasis and needs access for nephrolithotomy.  Procedure: Placement of right nephroureteral catheter  Findings: Right pelvic / proximal ureter stones.  Right nephrolithiasis and nephrocalcinosis.  Access obtained from a lower pole calyx.  Complications: None     EBL: less than 20 ml  Plan: To OR for stone removal.    Coury Grieger R. Lowella Dandy, MD  Pager: [phone]

## 2019-10-26 NOTE — Anesthesia Preprocedure Evaluation (Addendum)
Anesthesia Evaluation  Patient identified by MRN, date of birth, ID band Patient awake    Reviewed: Allergy & Precautions, NPO status , Patient's Chart, lab work & pertinent test results  History of Anesthesia Complications Negative for: history of anesthetic complications  Airway Mallampati: II  TM Distance: >3 FB Neck ROM: Full    Dental no notable dental hx. (+) Dental Advisory Given   Pulmonary neg pulmonary ROS, Current SmokerPatient did not abstain from smoking.,    Pulmonary exam normal        Cardiovascular Exercise Tolerance: Good Normal cardiovascular exam     Neuro/Psych negative neurological ROS  negative psych ROS   GI/Hepatic negative GI ROS, Neg liver ROS,   Endo/Other  negative endocrine ROS  Renal/GU Renal InsufficiencyRenal disease     Musculoskeletal negative musculoskeletal ROS (+)   Abdominal   Peds  Hematology negative hematology ROS (+)   Anesthesia Other Findings   Reproductive/Obstetrics                          Anesthesia Physical  Anesthesia Plan  ASA: II  Anesthesia Plan: General   Post-op Pain Management:    Induction: Intravenous  PONV Risk Score and Plan: 3 and Treatment may vary due to age or medical condition, Ondansetron, Dexamethasone and Midazolam  Airway Management Planned: Oral ETT  Additional Equipment:   Intra-op Plan:   Post-operative Plan: Extubation in OR  Informed Consent: I have reviewed the patients History and Physical, chart, labs and discussed the procedure including the risks, benefits and alternatives for the proposed anesthesia with the patient or authorized representative who has indicated his/her understanding and acceptance.     Dental advisory given  Plan Discussed with: CRNA and Anesthesiologist  Anesthesia Plan Comments:       Anesthesia Quick Evaluation

## 2019-10-27 LAB — HEMOGLOBIN AND HEMATOCRIT, BLOOD
HCT: 46.8 % (ref 39.0–52.0)
Hemoglobin: 15.6 g/dL (ref 13.0–17.0)

## 2019-10-27 LAB — POTASSIUM: Potassium: 3.1 mmol/L — ABNORMAL LOW (ref 3.5–5.1)

## 2019-10-27 LAB — BASIC METABOLIC PANEL
Anion gap: 9 (ref 5–15)
BUN: 16 mg/dL (ref 6–20)
CO2: 17 mmol/L — ABNORMAL LOW (ref 22–32)
Calcium: 8.3 mg/dL — ABNORMAL LOW (ref 8.9–10.3)
Chloride: 113 mmol/L — ABNORMAL HIGH (ref 98–111)
Creatinine, Ser: 1.96 mg/dL — ABNORMAL HIGH (ref 0.61–1.24)
GFR calc Af Amer: 51 mL/min — ABNORMAL LOW (ref >60)
GFR calc non Af Amer: 44 mL/min — ABNORMAL LOW (ref >60)
Glucose, Bld: 155 mg/dL — ABNORMAL HIGH (ref 70–99)
Potassium: 2.8 mmol/L — ABNORMAL LOW (ref 3.5–5.1)
Sodium: 139 mmol/L (ref 135–145)

## 2019-10-27 MED ORDER — POTASSIUM CHLORIDE 10 MEQ/100ML IV SOLN
10.0000 meq | INTRAVENOUS | Status: AC
  Administered 2019-10-27 (×3): 10 meq via INTRAVENOUS
  Filled 2019-10-27 (×4): qty 100

## 2019-10-27 MED ORDER — POTASSIUM CHLORIDE 20 MEQ PO PACK
20.0000 meq | PACK | Freq: Once | ORAL | Status: AC
  Administered 2019-10-27: 20 meq via ORAL
  Filled 2019-10-27: qty 1

## 2019-10-27 MED ORDER — OXYCODONE HCL 5 MG PO TABS: 5.0000 mg | ORAL_TABLET | Freq: Four times a day (QID) | ORAL | 0 refills | Status: AC | PRN

## 2019-10-27 MED ORDER — POTASSIUM CHLORIDE 10 MEQ/100ML IV SOLN
10.0000 meq | Freq: Once | INTRAVENOUS | Status: AC
  Administered 2019-10-27: 10 meq via INTRAVENOUS

## 2019-10-27 MED ORDER — POTASSIUM CHLORIDE 20 MEQ PO PACK
20.0000 meq | PACK | Freq: Once | ORAL | Status: AC
  Administered 2019-10-27: 05:00:00 20 meq via ORAL
  Filled 2019-10-27: qty 1

## 2019-10-27 NOTE — Discharge Summary (Signed)
[facility] Discharge Summary  Admit date: 10/26/2019  Discharge date and time: 10/27/19   Discharge to: Home  Discharge Service: Urology  Discharge Attending Physician:  Dr. Jacalyn Lefevre  Discharge  Diagnoses: Right renal stones; right ureteral stone  Secondary Diagnosis: Active Problems:   Nephrolithiasis   OR Procedures: Procedure(s): NEPHROLITHOTOMY PERCUTANEOUS-  RIGHT 10/26/2019   Ancillary Procedures: None   Discharge Day Services: The patient was seen and examined by the Urology team both in the morning and immediately prior to discharge.  Vital signs and laboratory values were stable and within normal limits. Open ended catheter was removed; he passed a PCN capping trial; PCN was removed. He passed a TOV. Potassium was repleated.  The physical exam was benign and unchanged and all surgical wounds were examined.  Discharge instructions were explained and all questions answered.  Subjective  No acute events overnight. Pain Controlled. No fever or chills.  Objective Patient Vitals for the past 8 hrs:  Temp  10/27/19 1254 98.3 F (36.8 C)   No intake/output data recorded.  General Appearance:        No acute distress Lungs:                       Normal work of breathing on room air Heart:                                Regular rate and rhythm Abdomen:                         Soft, non-tender, non-distended GU:       Right flank incision with small superficial ecchymosis. Dressed appropriately.  Extremities:                      Warm and well perfused   Hospital Course:  The patient underwent right PCNL after VIR access on 10/26/2019.  The patient tolerated the procedure well, was extubated in the OR, and afterwards was taken to the PACU for routine post-surgical care. When stable the patient was transferred to the floor. The patient did well postoperatively.  The patient's diet was advanced and at the time of discharge was tolerating a regular diet. On the  morning of POD1, foley was removed. Open ended catheter was removed; he passed a PCN capping trial; PCN was removed. He passed a TOV. Potassium was repleated. The patient was discharged home 1 Day Post-Op, at which point was tolerating a regular solid diet, was able to void spontaneously, have adequate pain control with P.O. pain medication, and could ambulate without difficulty. The patient will follow up with [place] for post op check and renal ultrasound.   Gram stain from OR was without organisms. No antibiotics indicated at this time.    Condition at Discharge: Improved  Discharge Medications:  Allergies as of 10/27/2019      Reactions   Ciprofloxacin Anaphylaxis   Zithromax [azithromycin] Rash      Medication List    TAKE these medications   BC Fast Pain Relief 845-65 MG Pack Generic drug: Aspirin-Caffeine Take 1 packet by mouth daily as needed (headache/back pain.).   cyclobenzaprine 10 MG tablet Commonly known as: FLEXERIL Take 1 tablet (10 mg total) by mouth 3 (three) times daily as needed for muscle spasms.   naproxen sodium 220 MG tablet Commonly known as: ALEVE Take 440 mg  by mouth daily as needed.   oxyCODONE 5 MG immediate release tablet Commonly known as: Roxicodone Take 1 tablet (5 mg total) by mouth every 6 (six) hours as needed for severe pain (post kidney stone surgery).   oxyCODONE-acetaminophen 5-325 MG tablet Commonly known as: Percocet Take 1 tablet by mouth every 4 (four) hours as needed for severe pain.   Potassium 99 MG Tabs Take 891 mg by mouth at bedtime.            Discharge Care Instructions  (From admission, onward)         Start     Ordered   10/27/19 0000  Discharge wound care:    Comments: Change dressing as needed.   10/27/19 1424

## 2019-10-27 NOTE — Discharge Instructions (Signed)
Discharge instructions following PCNL  Call your doctor for:  Fevers greater than 100.5  Severe nausea or vomiting  Increasing pain not controlled by pain medication  Increasing redness or drainage from incisions  Decreased urine output or a catheter is no longer draining  The number for questions is [phone].  Activity: Gradually increase activity with short frequent walks, 3-4 times a day.  Avoid strenuous activities, like sports, lawn-mowing, or heavy lifting (more than 10-15 pounds).  Wear loose, comfortable clothing.  Do not drive while taking pain medication, or until your doctor permitts it.  Bathing and dressing changes: You should not shower for 48 hours after surgery.  Do not soak your back in a bathtub.  The tube in the back was removed, you should expect to have some leakage of fluid from the back incision.  This should slowly decrease and stop over the next couple of days.  If you have severe pain or persistent leakage, please call the number above.  Otherwise, your dressing can be changed 1-2 times daily or more if needed.  Diet: It is extremely important to drink plenty of fluids after surgery, especially water.  You may resume your regular diet, unless otherwise instructed.  Medications: May take Tylenol (acetaminophen) or ibuprofen (Advil, Motrin) as directed over-the-counter. Take any prescriptions as directed.  Follow-up appointments: Follow-up appointment will be scheduled with Dr. Alvester Morin

## 2019-10-31 LAB — AEROBIC/ANAEROBIC CULTURE W GRAM STAIN (SURGICAL/DEEP WOUND)
Culture: NO GROWTH
Gram Stain: NONE SEEN

## 2019-11-23 ENCOUNTER — Emergency Department (HOSPITAL_BASED_OUTPATIENT_CLINIC_OR_DEPARTMENT_OTHER)
Admission: EM | Admit: 2019-11-23 | Discharge: 2019-11-24 | Disposition: A | Discharge status: Home / Self Care | Payer: Self-pay | Attending: Emergency Medicine | Admitting: Emergency Medicine

## 2019-11-23 ENCOUNTER — Emergency Department (HOSPITAL_BASED_OUTPATIENT_CLINIC_OR_DEPARTMENT_OTHER): Payer: Self-pay

## 2019-11-23 ENCOUNTER — Other Ambulatory Visit: Payer: Self-pay

## 2019-11-23 ENCOUNTER — Encounter (HOSPITAL_BASED_OUTPATIENT_CLINIC_OR_DEPARTMENT_OTHER): Payer: Self-pay | Admitting: *Deleted

## 2019-11-23 DIAGNOSIS — N201 Calculus of ureter: Secondary | ICD-10-CM | POA: Insufficient documentation

## 2019-11-23 DIAGNOSIS — F17210 Nicotine dependence, cigarettes, uncomplicated: Secondary | ICD-10-CM | POA: Insufficient documentation

## 2019-11-23 DIAGNOSIS — N23 Unspecified renal colic: Secondary | ICD-10-CM

## 2019-11-23 LAB — URINALYSIS, ROUTINE W REFLEX MICROSCOPIC
Bilirubin Urine: NEGATIVE
Glucose, UA: NEGATIVE mg/dL
Ketones, ur: NEGATIVE mg/dL
Nitrite: NEGATIVE
Protein, ur: 30 mg/dL — AB
Specific Gravity, Urine: 1.015 (ref 1.005–1.030)
pH: 7 (ref 5.0–8.0)

## 2019-11-23 LAB — URINALYSIS, MICROSCOPIC (REFLEX)

## 2019-11-23 MED ORDER — ONDANSETRON HCL 4 MG/2ML IJ SOLN: 4.0000 mg | Freq: Once | INTRAMUSCULAR | Status: DC

## 2019-11-23 MED ORDER — KETOROLAC TROMETHAMINE 30 MG/ML IJ SOLN: 30.0000 mg | Freq: Once | INTRAMUSCULAR | Status: DC

## 2019-11-23 MED ORDER — ONDANSETRON 4 MG PO TBDP
8.0000 mg | ORAL_TABLET | Freq: Once | ORAL | Status: AC
  Administered 2019-11-23: 8 mg via ORAL
  Filled 2019-11-23: qty 2

## 2019-11-23 MED ORDER — TAMSULOSIN HCL 0.4 MG PO CAPS: 0.4000 mg | ORAL_CAPSULE | Freq: Every day | ORAL | 0 refills | Status: DC

## 2019-11-23 MED ORDER — HYDROMORPHONE HCL 1 MG/ML IJ SOLN
2.0000 mg | Freq: Once | INTRAMUSCULAR | Status: AC
  Administered 2019-11-23: 2 mg via INTRAMUSCULAR
  Filled 2019-11-23: qty 2

## 2019-11-23 MED ORDER — HYDROMORPHONE HCL 1 MG/ML IJ SOLN: 1.0000 mg | Freq: Once | INTRAMUSCULAR | Status: DC

## 2019-11-23 MED ORDER — OXYCODONE-ACETAMINOPHEN 5-325 MG PO TABS: 1.0000 | ORAL_TABLET | ORAL | 0 refills | Status: DC | PRN

## 2019-11-23 MED ORDER — KETOROLAC TROMETHAMINE 60 MG/2ML IM SOLN
60.0000 mg | Freq: Once | INTRAMUSCULAR | Status: AC
  Administered 2019-11-23: 60 mg via INTRAMUSCULAR
  Filled 2019-11-23: qty 2

## 2019-11-23 NOTE — ED Triage Notes (Signed)
C/o let flank pain x 6 hrs HX stones

## 2019-11-23 NOTE — ED Provider Notes (Signed)
[facility] EMERGENCY DEPARTMENT Provider Note   CSN: [id number] Arrival date & time: 11/23/19  2113     History Chief Complaint  Patient presents with  . Flank Pain    Adam Weaver is a 34 y.o. male.  Patient presents to the emergency department for evaluation of flank pain.  Patient reports sudden onset of severe left flank pain approximately 6 hours ago.  He reports a history of kidney stones with similar pain.        Past Medical History:  Diagnosis Date  . History of kidney stones   . Hypokalemia   . Hypokalemic periodic paralysis    POTASSIUM GETS LOW TROUBLE MOVING DAY AFTER SURGERY  . Kidney stones   . 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 Nephrolithiasis 10/26/2019  . Acute kidney injury superimposed on CKD ([place]) 06/22/2019  . Acute lower UTI 06/22/2019  . Right nephrolithiasis 06/22/2019  . Obesity (BMI 30-39.9) 06/22/2019  . Erythrocytosis 06/22/2019  . Hyperglycemia 06/22/2019  . Bronchitis   . 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 Hypokalemia 12/05/2017  . ARF (acute renal failure) ([place]) 12/05/2017  . Leucocytosis 12/05/2017  . Dehydration 12/05/2017    Past Surgical History:  Procedure Laterality Date  . CYSTOSCOPY W/ URETERAL STENT PLACEMENT Right 06/21/2019   Procedure: CYSTOSCOPY WITH RIGHT URETERAL STENT PLACEMENT;  Surgeon: Robley Fries, MD;  Location: [facility];  Service: Urology;  Laterality: Right;  . CYSTOSCOPY WITH RETROGRADE PYELOGRAM, URETEROSCOPY AND STENT PLACEMENT Right 07/20/2019   Procedure: CYSTOSCOPY WITH RETROGRADE PYELOGRAM, URETEROSCOPY AND STENT PLACEMENT;  Surgeon: Robley Fries, MD;  Location: [facility];  Service: Urology;  Laterality: Right;  90 MINS  . HOLMIUM LASER APPLICATION Right 7/84/6962   Procedure: HOLMIUM LASER APPLICATION;  Surgeon: Robley Fries, MD;  Location: [facility];  Service: Urology;  Laterality: Right;  . HOLMIUM LASER APPLICATION Right 9/52/8413   Procedure: HOLMIUM LASER APPLICATION;  Surgeon: Robley Fries, MD;  Location: [facility];  Service: Urology;  Laterality: Right;  . IR URETERAL STENT RIGHT NEW ACCESS W/O SEP NEPHROSTOMY CATH  10/26/2019  . LITHOTRIPSY    . NEPHROLITHOTOMY Right 10/26/2019   Procedure: NEPHROLITHOTOMY PERCUTANEOUS;  Surgeon: Robley Fries, MD;  Location: [facility];  Service: Urology;  Laterality: Right;  3 HRS  . PINS IN RIGHT ARM     LATER REMOVED       No family history on file.  Social History   Tobacco Use  . Smoking status: Current Every Day Smoker    Packs/day: 1.00    Years: 3.00    Pack years: 3.00    Types: Cigarettes, E-cigarettes  . Smokeless tobacco: Never Used  Substance Use Topics  . Alcohol use: Yes    Comment: RARE  . Drug use: Never    Home Medications Prior to Admission medications   Medication Sig Start Date End Date Taking? Authorizing Provider  Aspirin-Caffeine (BC FAST PAIN RELIEF) 845-65 MG PACK Take 1 packet by mouth daily as needed (headache/back pain.).    [provider]  cyclobenzaprine (FLEXERIL) 10 MG tablet Take 1 tablet (10 mg total) by mouth 3 (three) times daily as needed for muscle spasms. Patient not taking: Reported on 10/05/2019 09/12/19   Molpus, [name], MD  naproxen sodium (ALEVE) 220 MG tablet Take 440 mg by mouth daily  as needed.    [provider]  oxyCODONE (ROXICODONE) 5 MG immediate release tablet Take 1 tablet (5 mg total) by mouth every 6 (six) hours as needed for severe pain (post kidney stone surgery). 10/27/19 10/26/20  Noel Christmas, MD  oxyCODONE-acetaminophen (PERCOCET) 5-325 MG tablet Take 1-2 tablets by mouth every 4 (four) hours as needed. 11/23/19   Gilda Crease, MD  Potassium 99 MG TABS Take 891 mg by mouth at bedtime.     [provider]  tamsulosin (FLOMAX) 0.4 MG CAPS  capsule Take 1 capsule (0.4 mg total) by mouth daily. 11/23/19   Gilda Crease, MD    Allergies    Ciprofloxacin and Zithromax [azithromycin]  Review of Systems   Review of Systems  Genitourinary: Positive for flank pain.  All other systems reviewed and are negative.   Physical Exam Updated Vital Signs BP (!) 126/94 (BP Location: Right Arm)   Pulse 80   Temp 98.4 F (36.9 C)   Resp 16   Ht 5\' 11"  (1.803 m)   Wt 118.8 kg   SpO2 93%   BMI 36.54 kg/m   Physical Exam Vitals and nursing note reviewed.  Constitutional:      General: He is not in acute distress.    Appearance: Normal appearance. He is well-developed.  HENT:     Head: Normocephalic and atraumatic.     Right Ear: Hearing normal.     Left Ear: Hearing normal.     Nose: Nose normal.  Eyes:     Conjunctiva/sclera: Conjunctivae normal.     Pupils: Pupils are equal, round, and reactive to light.  Cardiovascular:     Rate and Rhythm: Regular rhythm.     Heart sounds: S1 normal and S2 normal. No murmur. No friction rub. No gallop.   Pulmonary:     Effort: Pulmonary effort is normal. No respiratory distress.     Breath sounds: Normal breath sounds.  Chest:     Chest wall: No tenderness.  Abdominal:     General: Bowel sounds are normal.     Palpations: Abdomen is soft.     Tenderness: There is no abdominal tenderness. There is no guarding or rebound. Negative signs include Murphy's sign and McBurney's sign.     Hernia: No hernia is present.  Musculoskeletal:        General: Normal range of motion.     Cervical back: Normal range of motion and neck supple.  Skin:    General: Skin is warm and dry.     Findings: No rash.  Neurological:     Mental Status: He is alert and oriented to person, place, and time.     GCS: GCS eye subscore is 4. GCS verbal subscore is 5. GCS motor subscore is 6.     Cranial Nerves: No cranial nerve deficit.     Sensory: No sensory deficit.     Coordination: Coordination  normal.  Psychiatric:        Speech: Speech normal.        Behavior: Behavior normal.        Thought Content: Thought content normal.     ED Results / Procedures / Treatments   Labs (all labs ordered are listed, but only abnormal results are displayed) Labs Reviewed  URINALYSIS, ROUTINE W REFLEX MICROSCOPIC - Abnormal; Notable for the following components:      Result Value   APPearance CLOUDY (*)    Hgb urine dipstick LARGE (*)  Protein, ur 30 (*)    Leukocytes,Ua MODERATE (*)    All other components within normal limits  URINALYSIS, MICROSCOPIC (REFLEX) - Abnormal; Notable for the following components:   Bacteria, UA RARE (*)    All other components within normal limits    EKG None  Radiology CT Renal Stone Study  Result Date: 11/23/2019 CLINICAL DATA:  Left flank pain EXAM: CT ABDOMEN AND PELVIS WITHOUT CONTRAST TECHNIQUE: Multidetector CT imaging of the abdomen and pelvis was performed following the standard protocol without IV contrast. COMPARISON:  06/21/2019 FINDINGS: LOWER CHEST: Normal. HEPATOBILIARY: Normal hepatic contours. No intra- or extrahepatic biliary dilatation. Normal gallbladder. PANCREAS: Normal pancreas. No ductal dilatation or peripancreatic fluid collection. SPLEEN: Normal. ADRENALS/URINARY TRACT: The adrenal glands are normal. Bilateral nephro medullary calcinosis. There is mild left hydroureteronephrosis with a stone in the distal left ureter measuring 7 mm. The urinary bladder is normal for degree of distention STOMACH/BOWEL: There is no hiatal hernia. Normal duodenal course and caliber. No small bowel dilatation or inflammation. No focal colonic abnormality. Normal appendix. VASCULAR/LYMPHATIC: Normal course and caliber of the major abdominal vessels. No abdominal or pelvic lymphadenopathy. REPRODUCTIVE: Normal prostate size with symmetric seminal vesicles. MUSCULOSKELETAL. No bony spinal canal stenosis or focal osseous abnormality. OTHER: None. IMPRESSION:  1. Left-sided obstructive uropathy with 7 mm stone in the distal left ureter causing mild hydroureteronephrosis. 2. Bilateral nephromedullary calcinosis. Electronically Signed   By: Deatra Robinson M.D.   On: 11/23/2019 21:51    Procedures Procedures (including critical care time)  Medications Ordered in ED Medications  ketorolac (TORADOL) injection 60 mg (has no administration in time range)  ondansetron (ZOFRAN-ODT) disintegrating tablet 8 mg (has no administration in time range)  HYDROmorphone (DILAUDID) injection 2 mg (has no administration in time range)    ED Course  I have reviewed the triage vital signs and the nursing notes.  Pertinent labs & imaging results that were available during my care of the patient were reviewed by me and considered in my medical decision making (see chart for details).    MDM Rules/Calculators/A&P                      Patient with history of recurrent kidney stones presents to the emergency department with complaints of sudden onset left flank pain.  Work-up shows hydroureteronephrosis caused by a distal ureteral stone that is 7 mm.  Patient reports that his pain spontaneously improved here in the ER.  He was ministered additional analgesia and will be discharged.  He has established care with an outside urologist that he will call in the morning.  Final Clinical Impression(s) / ED Diagnoses Final diagnoses:  Ureteral colic    Rx / DC Orders ED Discharge Orders         Ordered    tamsulosin (FLOMAX) 0.4 MG CAPS capsule  Daily     11/23/19 2339    oxyCODONE-acetaminophen (PERCOCET) 5-325 MG tablet  Every 4 hours PRN     11/23/19 2339           Gilda Crease, MD 11/23/19 2339

## 2020-12-16 IMAGING — XA IR URETURAL STENT RIGHT NEW ACCESS W/O SEP NEPHROSTOMY CATH
8 of 11 series · 13 of 24 positions shown · IV contrast (IODINE)
Comparison: None.

INDICATION: 34-year-old with nephrocalcinosis and nephrolithiasis. Previous
retrograde stone extraction. Patient has a right ureter stent.
Patient needs percutaneous access with a nephroureteral catheter.

EXAM:
PLACEMENT OF RIGHT NEPHROURETERAL CATHETER WITH ULTRASOUND AND
FLUOROSCOPIC GUIDANCE

[Series 3: care body 4 · 1 of 1 slices shown (1 of 3)]
[im 1/1]
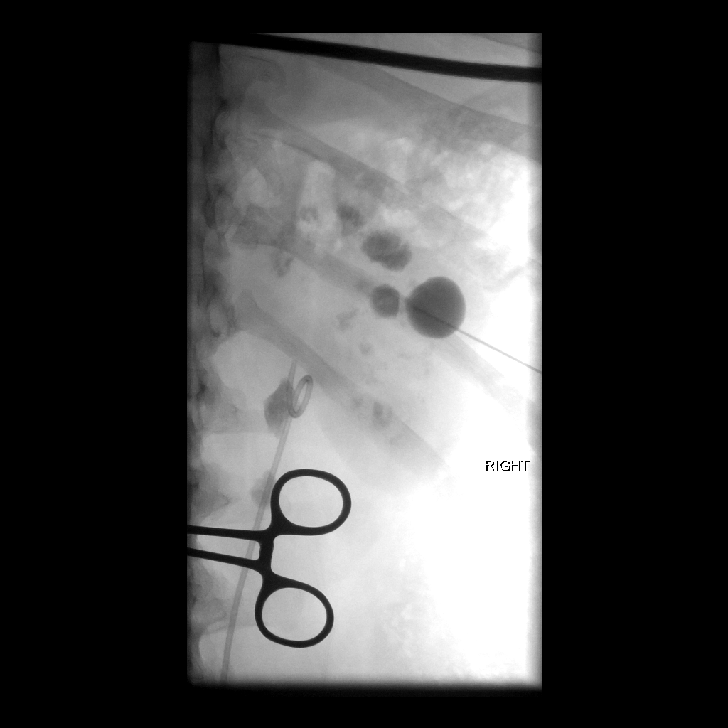

[Series 6: fl - angio · 2 of 38 frames shown (1 of 4)]
[frame 6/38]
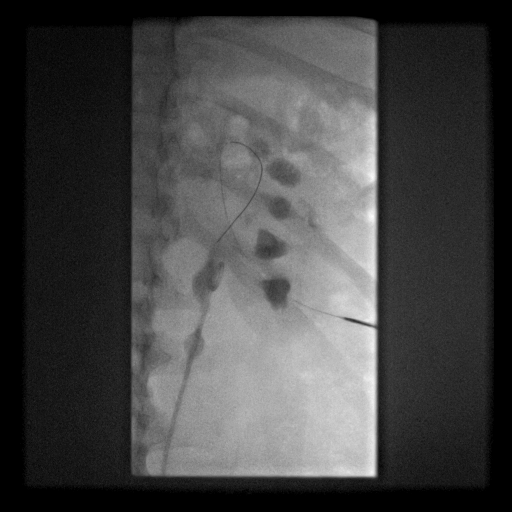
[frame 33/38]
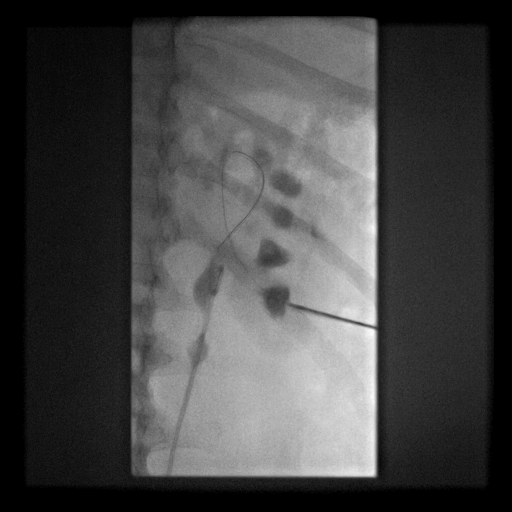

[Series 8: care body 4 · 1 of 1 slices shown (2 of 3)]
[im 1/1]
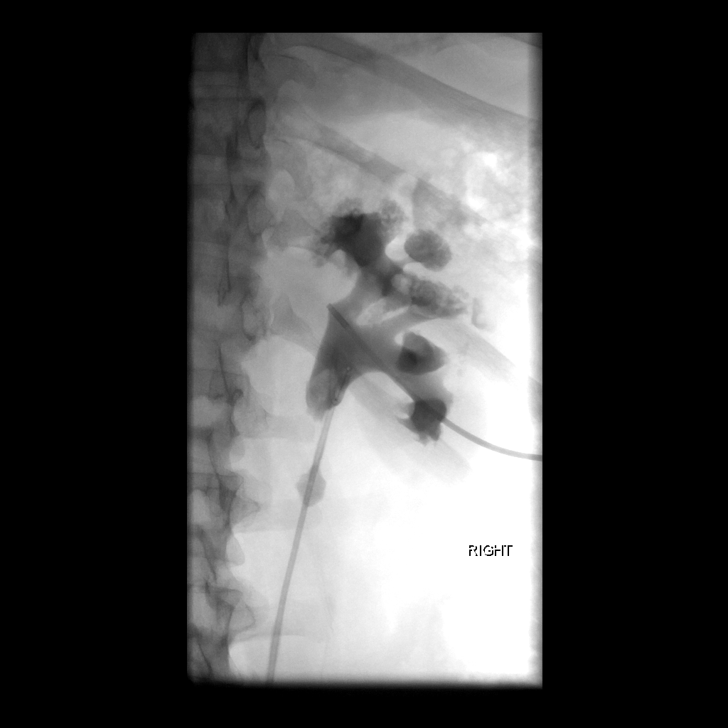

[Series 11: fl - angio · 2 of 113 frames shown (2 of 4)]
[frame 17/113]
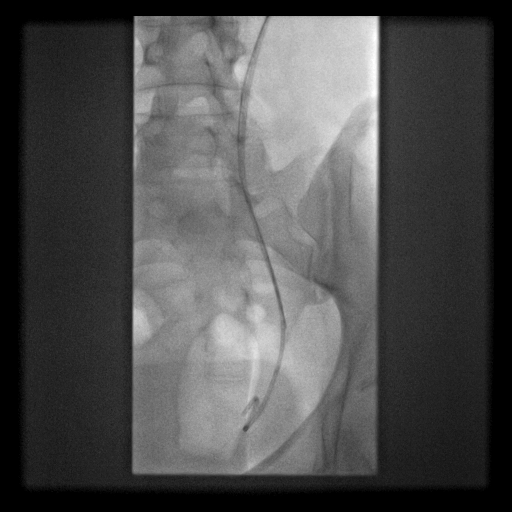
[frame 97/113]
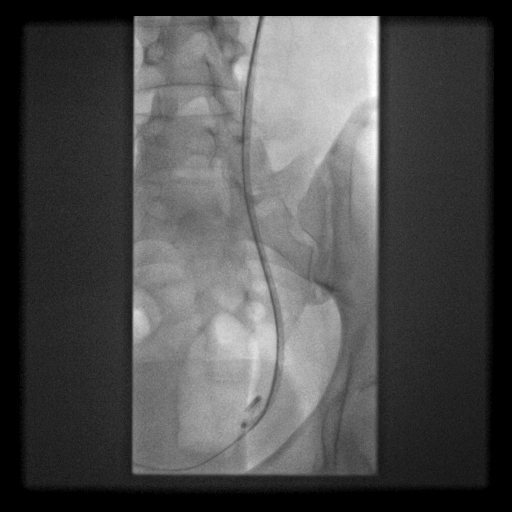

[Series 12: fl - angio · 2 of 43 frames shown (3 of 4)]
[frame 22/43]
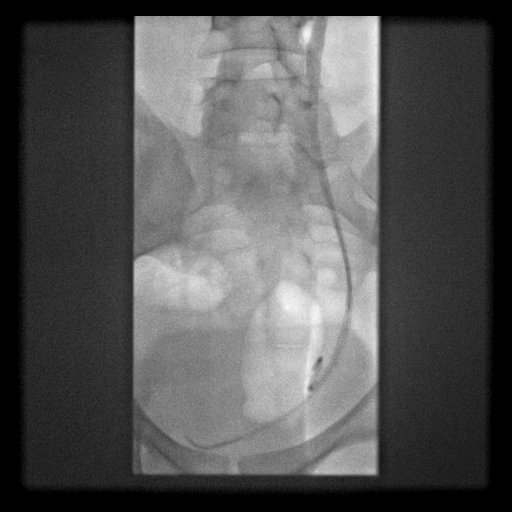
[frame 37/43]
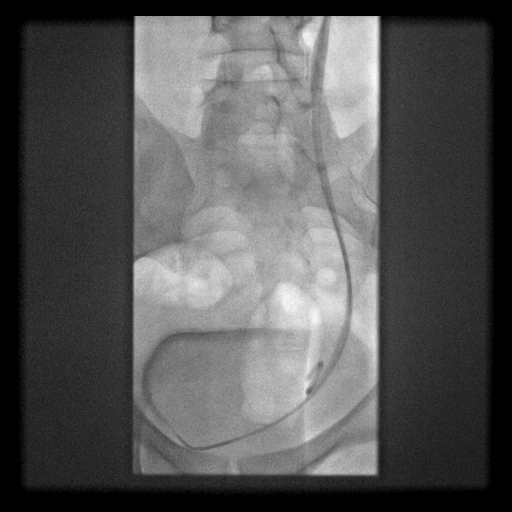

[Series 13: fl - angio · 2 of 57 frames shown (4 of 4)]
[frame 29/57]
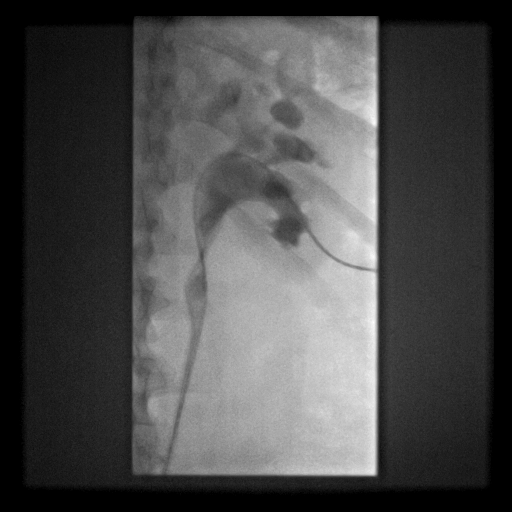
[frame 55/57  full-range]
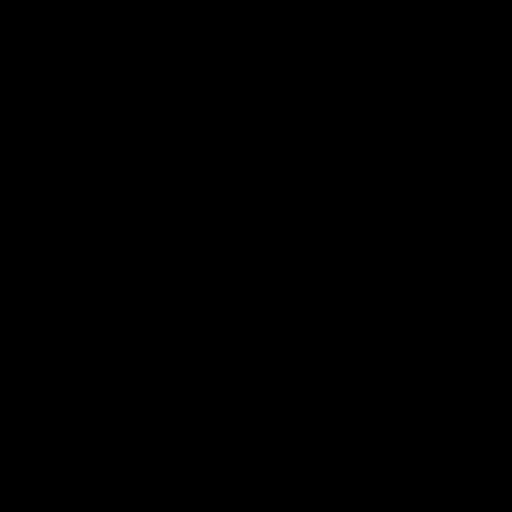

[Series 15: care body 4 · 1 of 1 slices shown (3 of 3)]
[im 1/1]
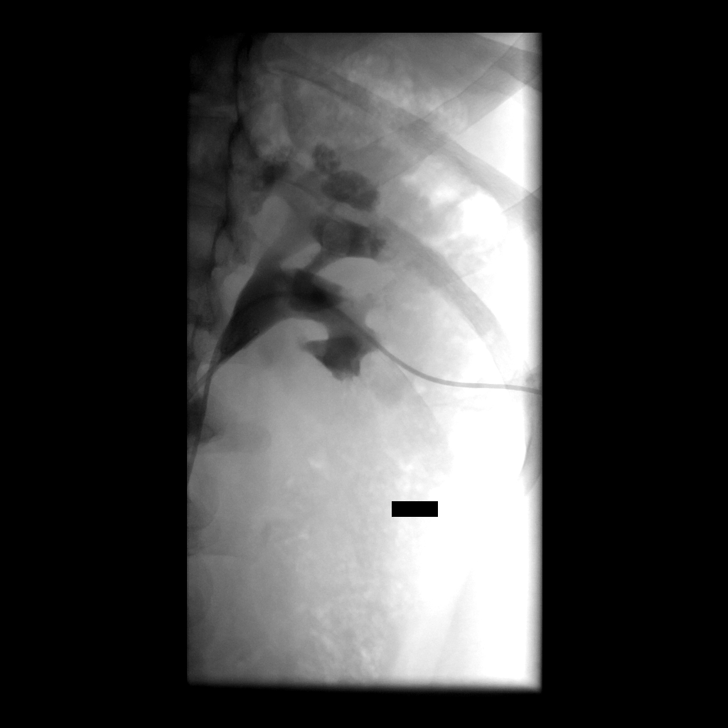

[Series 300: ir ureteral stent right new access w/o s · non-contrast · 2 of 5 slices shown]
[im 3/5]
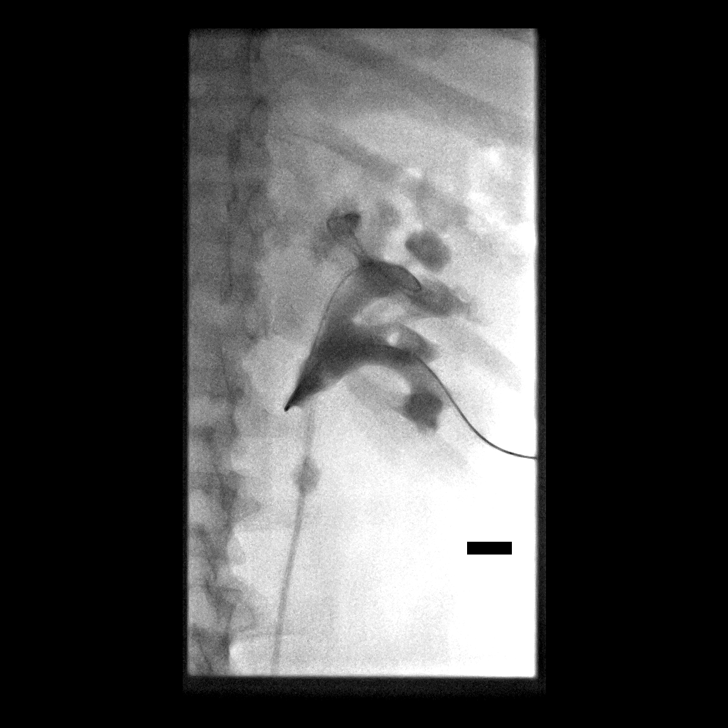
[im 5/5]
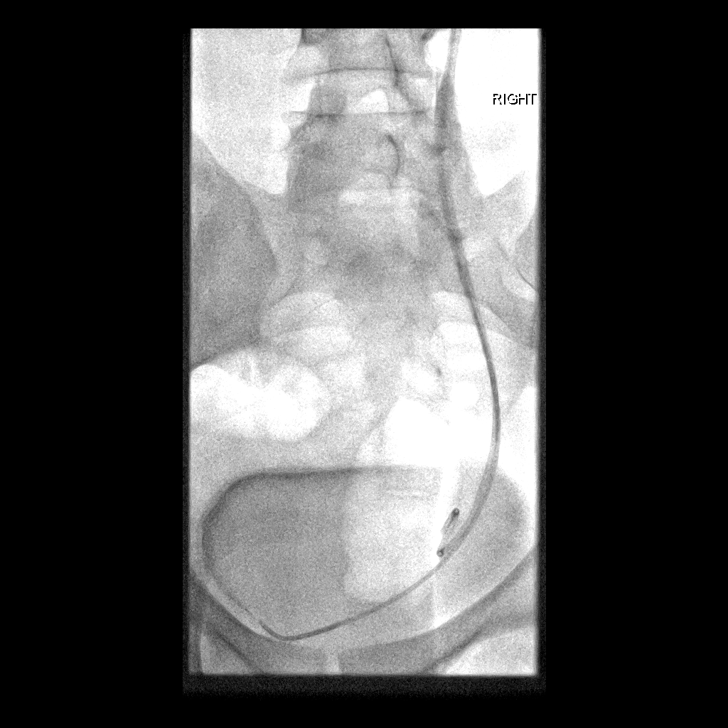

[13 of 24 positions shown; findings below may reference images not displayed]

MEDICATIONS:
Ancef 2 g; The antibiotic was administered in an appropriate time
frame prior to skin puncture.

ANESTHESIA/SEDATION:
Fentanyl 150 mcg IV; Versed 4.0 mg IV

Moderate Sedation Time:  29 minutes

The patient was continuously monitored during the procedure by the
interventional radiology nurse under my direct supervision.

CONTRAST:  15 mL Omnipaque 300-administered into the collecting
system(s)

FLUOROSCOPY TIME:  Fluoroscopy Time: 3.0 minutes, 59 mGy

COMPLICATIONS:
None immediate.

PROCEDURE:
Informed written consent was obtained from the patient after a
thorough discussion of the procedural risks, benefits and
alternatives. All questions were addressed. Maximal Sterile Barrier
Technique was utilized including caps, mask, sterile gowns, sterile
gloves, sterile drape, hand hygiene and skin antiseptic. A timeout
was performed prior to the initiation of the procedure.

Patient was placed prone. The right flank was prepped and draped in
sterile fashion. Ultrasound was used to identify the right kidney.
Skin was anesthetized with 1% lidocaine. Using ultrasound guidance,
needle was directed into a dilated calyx. Contrast injection
demonstrated a dilated mid pole calyx that was completely
obstructed. Contrast was filling the dilated calyx but not draining
into the renal pelvis. This access was not amendable for the
nephrolithotomy procedure. Contrast was aspirated from this calyx.
Attention was directed to a lower pole calyx. Mildly dilated lower
pole calyx was identified with ultrasound. Using ultrasound
guidance, 21 gauge needle was directed into the lower pole calyx and
contrast injection confirmed placement in the calyx and renal
collecting system. A 0.018 wire was easily advanced into the renal
collecting system. Tract was dilated to accommodate a transitional
dilator [DATE] French catheter was placed over a Bentson wire. The
catheter was manipulated past the stone in the renal pelvis and
proximal ureter using fluoroscopy. Catheter was advanced to the
urinary bladder. Contrast injection confirmed placement in the
urinary bladder. Catheter was flushed with saline and capped.
Catheter was sutured to skin. Sterile dressing was placed over the
catheter.
FINDINGS: Numerous calcifications involving both kidneys compatible with
history of nephrolithiasis. Right ureter stent with proximal aspect
in the renal pelvis and the distal aspect in the urinary bladder.
Large stone adjacent to the proximal aspect of the stent within the
renal pelvis. There is a second large stone in the proximal right
ureter. Ultrasound demonstrated areas of moderate hydronephrosis.
Initially, a dilated mid pole calyx was punctured but this calyx was
completely obstructed from a stone. Percutaneous access was obtained
from a mildly dilated lower pole calyx. Catheter was successfully
advanced to the urinary bladder.
IMPRESSION: Successful placement of a right nephroureteral catheter using
ultrasound and fluoroscopic guidance.

## 2022-03-31 DIAGNOSIS — Z419 Encounter for procedure for purposes other than remedying health state, unspecified: Secondary | ICD-10-CM | POA: Diagnosis not present

## 2022-04-13 ENCOUNTER — Encounter (HOSPITAL_COMMUNITY): Payer: Self-pay

## 2022-04-13 ENCOUNTER — Other Ambulatory Visit: Payer: Self-pay

## 2022-04-13 ENCOUNTER — Encounter (HOSPITAL_BASED_OUTPATIENT_CLINIC_OR_DEPARTMENT_OTHER): Payer: Self-pay | Admitting: Emergency Medicine

## 2022-04-13 ENCOUNTER — Inpatient Hospital Stay (HOSPITAL_BASED_OUTPATIENT_CLINIC_OR_DEPARTMENT_OTHER)
Admission: EM | Admit: 2022-04-13 | Discharge: 2022-04-15 | DRG: 641 | Disposition: A | Discharge status: Home / Self Care | Payer: Medicare Other | Attending: Internal Medicine | Admitting: Internal Medicine

## 2022-04-13 DIAGNOSIS — Z881 Allergy status to other antibiotic agents status: Secondary | ICD-10-CM

## 2022-04-13 DIAGNOSIS — N1832 Chronic kidney disease, stage 3b: Secondary | ICD-10-CM | POA: Diagnosis not present

## 2022-04-13 DIAGNOSIS — F17290 Nicotine dependence, other tobacco product, uncomplicated: Secondary | ICD-10-CM | POA: Diagnosis not present

## 2022-04-13 DIAGNOSIS — Z79899 Other long term (current) drug therapy: Secondary | ICD-10-CM

## 2022-04-13 DIAGNOSIS — E876 Hypokalemia: Principal | ICD-10-CM

## 2022-04-13 DIAGNOSIS — R531 Weakness: Secondary | ICD-10-CM

## 2022-04-13 DIAGNOSIS — E872 Acidosis, unspecified: Secondary | ICD-10-CM | POA: Diagnosis present

## 2022-04-13 DIAGNOSIS — F17210 Nicotine dependence, cigarettes, uncomplicated: Secondary | ICD-10-CM | POA: Diagnosis not present

## 2022-04-13 DIAGNOSIS — Z87442 Personal history of urinary calculi: Secondary | ICD-10-CM

## 2022-04-13 LAB — URINALYSIS, MICROSCOPIC (REFLEX)

## 2022-04-13 LAB — COMPREHENSIVE METABOLIC PANEL
ALT: 30 U/L (ref 0–44)
AST: 21 U/L (ref 15–41)
Albumin: 4.2 g/dL (ref 3.5–5.0)
Alkaline Phosphatase: 98 U/L (ref 38–126)
Anion gap: 5 (ref 5–15)
BUN: 21 mg/dL — ABNORMAL HIGH (ref 6–20)
CO2: 15 mmol/L — ABNORMAL LOW (ref 22–32)
Calcium: 8.7 mg/dL — ABNORMAL LOW (ref 8.9–10.3)
Chloride: 119 mmol/L — ABNORMAL HIGH (ref 98–111)
Creatinine, Ser: 1.59 mg/dL — ABNORMAL HIGH (ref 0.61–1.24)
GFR, Estimated: 58 mL/min — ABNORMAL LOW (ref >60)
Glucose, Bld: 131 mg/dL — ABNORMAL HIGH (ref 70–99)
Potassium: 2.1 mmol/L — CL (ref 3.5–5.1)
Sodium: 139 mmol/L (ref 135–145)
Total Bilirubin: 0.7 mg/dL (ref 0.3–1.2)
Total Protein: 7.6 g/dL (ref 6.5–8.1)

## 2022-04-13 LAB — RENAL FUNCTION PANEL
Albumin: 3.5 g/dL (ref 3.5–5.0)
Anion gap: 0 — ABNORMAL LOW (ref 5–15)
BUN: 19 mg/dL (ref 6–20)
CO2: 20 mmol/L — ABNORMAL LOW (ref 22–32)
Calcium: 7.9 mg/dL — ABNORMAL LOW (ref 8.9–10.3)
Chloride: 122 mmol/L — ABNORMAL HIGH (ref 98–111)
Creatinine, Ser: 1.59 mg/dL — ABNORMAL HIGH (ref 0.61–1.24)
GFR, Estimated: 58 mL/min — ABNORMAL LOW (ref >60)
Glucose, Bld: 96 mg/dL (ref 70–99)
Phosphorus: 1.3 mg/dL — ABNORMAL LOW (ref 2.5–4.6)
Potassium: 2.6 mmol/L — CL (ref 3.5–5.1)
Sodium: 142 mmol/L (ref 135–145)

## 2022-04-13 LAB — CBC WITH DIFFERENTIAL/PLATELET
Abs Immature Granulocytes: 0.06 10*3/uL (ref 0.00–0.07)
Basophils Absolute: 0.1 10*3/uL (ref 0.0–0.1)
Basophils Relative: 1 %
Eosinophils Absolute: 0.4 10*3/uL (ref 0.0–0.5)
Eosinophils Relative: 3 %
HCT: 52 % (ref 39.0–52.0)
Hemoglobin: 18.6 g/dL — ABNORMAL HIGH (ref 13.0–17.0)
Immature Granulocytes: 1 %
Lymphocytes Relative: 20 %
Lymphs Abs: 2.2 10*3/uL (ref 0.7–4.0)
MCH: 30.6 pg (ref 26.0–34.0)
MCHC: 35.8 g/dL (ref 30.0–36.0)
MCV: 85.5 fL (ref 80.0–100.0)
Monocytes Absolute: 0.8 10*3/uL (ref 0.1–1.0)
Monocytes Relative: 7 %
Neutro Abs: 7.5 10*3/uL (ref 1.7–7.7)
Neutrophils Relative %: 68 %
Platelets: 297 10*3/uL (ref 150–400)
RBC: 6.08 MIL/uL — ABNORMAL HIGH (ref 4.22–5.81)
RDW: 15.5 % (ref 11.5–15.5)
WBC: 10.9 10*3/uL — ABNORMAL HIGH (ref 4.0–10.5)
nRBC: 0 % (ref 0.0–0.2)

## 2022-04-13 LAB — BASIC METABOLIC PANEL WITH GFR
Anion gap: 4 — ABNORMAL LOW (ref 5–15)
BUN: 18 mg/dL (ref 6–20)
CO2: 15 mmol/L — ABNORMAL LOW (ref 22–32)
Calcium: 7.9 mg/dL — ABNORMAL LOW (ref 8.9–10.3)
Chloride: 121 mmol/L — ABNORMAL HIGH (ref 98–111)
Creatinine, Ser: 1.58 mg/dL — ABNORMAL HIGH (ref 0.61–1.24)
GFR, Estimated: 58 mL/min — ABNORMAL LOW
Glucose, Bld: 88 mg/dL (ref 70–99)
Potassium: 2.4 mmol/L — CL (ref 3.5–5.1)
Sodium: 140 mmol/L (ref 135–145)

## 2022-04-13 LAB — URINALYSIS, ROUTINE W REFLEX MICROSCOPIC
Bilirubin Urine: NEGATIVE
Glucose, UA: NEGATIVE mg/dL
Ketones, ur: NEGATIVE mg/dL
Leukocytes,Ua: NEGATIVE
Nitrite: NEGATIVE
Protein, ur: NEGATIVE mg/dL
Specific Gravity, Urine: 1.01 (ref 1.005–1.030)
pH: 7 (ref 5.0–8.0)

## 2022-04-13 LAB — LACTIC ACID, PLASMA: Lactic Acid, Venous: 0.7 mmol/L (ref 0.5–1.9)

## 2022-04-13 LAB — CK: Total CK: 160 U/L (ref 49–397)

## 2022-04-13 LAB — MAGNESIUM: Magnesium: 2.4 mg/dL (ref 1.7–2.4)

## 2022-04-13 MED ORDER — ACETAMINOPHEN 650 MG RE SUPP: 650.0000 mg | Freq: Four times a day (QID) | RECTAL | Status: DC | PRN

## 2022-04-13 MED ORDER — ENOXAPARIN SODIUM 40 MG/0.4ML IJ SOSY
40.0000 mg | PREFILLED_SYRINGE | INTRAMUSCULAR | Status: DC
  Administered 2022-04-13 – 2022-04-14 (×2): 40 mg via SUBCUTANEOUS
  Filled 2022-04-13 (×2): qty 0.4

## 2022-04-13 MED ORDER — NICOTINE 14 MG/24HR TD PT24
14.0000 mg | MEDICATED_PATCH | Freq: Every day | TRANSDERMAL | Status: DC
  Administered 2022-04-13 – 2022-04-14 (×2): 14 mg via TRANSDERMAL
  Filled 2022-04-13 (×2): qty 1

## 2022-04-13 MED ORDER — HYDROMORPHONE HCL 1 MG/ML IJ SOLN
0.5000 mg | Freq: Once | INTRAMUSCULAR | Status: AC
  Administered 2022-04-13: 0.5 mg via INTRAVENOUS
  Filled 2022-04-13: qty 1

## 2022-04-13 MED ORDER — POTASSIUM CHLORIDE 10 MEQ/100ML IV SOLN
10.0000 meq | INTRAVENOUS | Status: AC
  Administered 2022-04-13 (×4): 10 meq via INTRAVENOUS
  Filled 2022-04-13 (×3): qty 100

## 2022-04-13 MED ORDER — ACETAMINOPHEN 325 MG PO TABS: 650.0000 mg | ORAL_TABLET | Freq: Four times a day (QID) | ORAL | Status: DC | PRN

## 2022-04-13 MED ORDER — POTASSIUM CHLORIDE 10 MEQ/100ML IV SOLN
10.0000 meq | INTRAVENOUS | Status: AC
  Administered 2022-04-13 (×3): 10 meq via INTRAVENOUS
  Filled 2022-04-13: qty 100

## 2022-04-13 MED ORDER — POTASSIUM CHLORIDE CRYS ER 20 MEQ PO TBCR: 40.0000 meq | EXTENDED_RELEASE_TABLET | Freq: Two times a day (BID) | ORAL | Status: DC

## 2022-04-13 MED ORDER — MELATONIN 5 MG PO TABS
5.0000 mg | ORAL_TABLET | Freq: Every day | ORAL | Status: DC
  Administered 2022-04-13 – 2022-04-14 (×2): 5 mg via ORAL
  Filled 2022-04-13 (×2): qty 1

## 2022-04-13 MED ORDER — SODIUM CHLORIDE 0.9 % IV SOLN: INTRAVENOUS | Status: DC

## 2022-04-13 MED ORDER — SODIUM BICARBONATE 650 MG PO TABS
650.0000 mg | ORAL_TABLET | Freq: Two times a day (BID) | ORAL | Status: DC
  Administered 2022-04-13 – 2022-04-14 (×3): 650 mg via ORAL
  Filled 2022-04-13 (×3): qty 1

## 2022-04-13 MED ORDER — POTASSIUM CHLORIDE 10 MEQ/100ML IV SOLN
10.0000 meq | INTRAVENOUS | Status: DC
  Administered 2022-04-13: 10 meq via INTRAVENOUS

## 2022-04-13 MED ORDER — POTASSIUM CHLORIDE 10 MEQ/100ML IV SOLN
INTRAVENOUS | Status: AC
  Filled 2022-04-13: qty 100

## 2022-04-13 MED ORDER — SODIUM CHLORIDE 0.9 % IV BOLUS
1000.0000 mL | Freq: Once | INTRAVENOUS | Status: AC
  Administered 2022-04-13: 1000 mL via INTRAVENOUS

## 2022-04-13 MED ORDER — TRAZODONE HCL 50 MG PO TABS
25.0000 mg | ORAL_TABLET | Freq: Once | ORAL | Status: AC
  Administered 2022-04-14: 25 mg via ORAL
  Filled 2022-04-13: qty 1

## 2022-04-13 MED ORDER — POTASSIUM CHLORIDE CRYS ER 20 MEQ PO TBCR
40.0000 meq | EXTENDED_RELEASE_TABLET | Freq: Once | ORAL | Status: AC
  Administered 2022-04-13: 40 meq via ORAL
  Filled 2022-04-13: qty 2

## 2022-04-13 MED ORDER — LACTATED RINGERS IV SOLN: INTRAVENOUS | Status: DC

## 2022-04-13 MED ORDER — POTASSIUM CHLORIDE 10 MEQ/100ML IV SOLN: 10.0000 meq | Freq: Once | INTRAVENOUS | Status: DC

## 2022-04-13 MED ORDER — OXYCODONE HCL 5 MG PO TABS
5.0000 mg | ORAL_TABLET | ORAL | Status: DC | PRN
  Administered 2022-04-13: 5 mg via ORAL
  Filled 2022-04-13: qty 1

## 2022-04-13 MED ORDER — POTASSIUM CHLORIDE 10 MEQ/100ML IV SOLN
10.0000 meq | INTRAVENOUS | Status: AC
  Administered 2022-04-13 (×2): 10 meq via INTRAVENOUS
  Filled 2022-04-13 (×2): qty 100

## 2022-04-13 NOTE — ED Triage Notes (Signed)
Pt reports bilateral arm weakness and leg weakness that started this am. Hx of low potassium and has had similar symptoms when potassium is low before. Tried home potassium with no relief

## 2022-04-13 NOTE — Progress Notes (Signed)
Spiritual Care consult for patient wanting Advanced Directive. Met with patient and wife bedside. Provided the education for this document. Advised patient to have staff contact chaplain on Monday if he is still here.

## 2022-04-13 NOTE — Progress Notes (Signed)
Plan of Care Note for accepted transfer   Patient: Adam [place] MRN: [id number]   DOA: 04/13/2022  Facility requesting transfer: [facility] Requesting Provider: Dr. Nechama Guard Reason for transfer: Hypokalemia Facility course: 36 yo M w/ history of familial hypokalemic periodic paralysis, CKD3b. Presenting with generalized weakness. W/u reveal he was hypokalemic. Mg2+ is ok. This is a recurring issue. Started on IV and oral K+. Also started on fluids. EKG ok.   Plan of care: The patient is accepted for admission to Progressive unit, at [facility]..  While holding at [facility], medical decision making responsibilities remain with the [place]. Upon arrival to [facility], [facility] will assume care. Thank you.  Author: Jonnie Finner, DO 04/13/2022  Check [email or web address] for on-call coverage.  Nursing staff, Please call [place] number on Amion as soon as patient's arrival, so appropriate admitting provider can evaluate the pt.

## 2022-04-13 NOTE — H&P (Signed)
History and Physical    Patient: Adam Weaver JQB:[id number] DOB: 1986/05/11 DOA: 04/13/2022 DOS: the patient was seen and examined on 04/13/2022 PCP: Patient, No Pcp Per  Patient coming from: Home  Chief Complaint:  Chief Complaint  Patient presents with   Weakness   HPI: Marshal Eskew is a 36 y.o. male with medical history significant of familial hypokalemic periodic paralysis, CKD3b. Presenting with generalized weakness. He reports that he hasn't had much appetite this week. He states when that happens, he knows he's going to have a flare of his hypokalemic paralysis. He has noticed of the last couple of days he's been weaker. This morning, he found it extremely difficult to move his arms and legs. This is typical of his flares. He reports that he's only recently re-established insurance. So he's had these events at least once a month. He's only been able to take OTC potassium supplements. When his symptoms worsened this morning, he was brought to the ED for evaluation. He denies any other aggravating or alleviating factors.   Review of Systems: As mentioned in the history of present illness. All other systems reviewed and are negative. Past Medical History:  Diagnosis Date   History of kidney stones    Hypokalemia    Hypokalemic periodic paralysis    POTASSIUM GETS LOW TROUBLE MOVING DAY AFTER SURGERY   Kidney stones    Renal disorder    STAGE 3 CKD, NO NEPHROLOGIST   Past Surgical History:  Procedure Laterality Date   CYSTOSCOPY W/ URETERAL STENT PLACEMENT Right 06/21/2019   Procedure: CYSTOSCOPY WITH RIGHT URETERAL STENT PLACEMENT;  Surgeon: Robley Fries, MD;  Location: [facility];  Service: Urology;  Laterality: Right;   CYSTOSCOPY WITH RETROGRADE PYELOGRAM, URETEROSCOPY AND STENT PLACEMENT Right 07/20/2019   Procedure: CYSTOSCOPY WITH RETROGRADE PYELOGRAM, URETEROSCOPY AND STENT PLACEMENT;  Surgeon: Robley Fries, MD;  Location: [facility];  Service:  Urology;  Laterality: Right;  90 MINS   HOLMIUM LASER APPLICATION Right 10/08/7351   Procedure: HOLMIUM LASER APPLICATION;  Surgeon: Robley Fries, MD;  Location: [facility];  Service: Urology;  Laterality: Right;   HOLMIUM LASER APPLICATION Right 2/99/2426   Procedure: HOLMIUM LASER APPLICATION;  Surgeon: Robley Fries, MD;  Location: [facility];  Service: Urology;  Laterality: Right;   IR URETERAL STENT RIGHT NEW ACCESS W/O SEP NEPHROSTOMY CATH  10/26/2019   LITHOTRIPSY     NEPHROLITHOTOMY Right 10/26/2019   Procedure: NEPHROLITHOTOMY PERCUTANEOUS;  Surgeon: Robley Fries, MD;  Location: [facility];  Service: Urology;  Laterality: Right;  3 HRS   PINS IN RIGHT ARM     LATER REMOVED   Social History:  reports that he has been smoking cigarettes and e-cigarettes. He has a 3.00 pack-year smoking history. He has never used smokeless tobacco. He reports current alcohol use. He reports that he does not use drugs.  Allergies  Allergen Reactions   Ciprofloxacin Anaphylaxis   Zithromax [Azithromycin] Rash    History reviewed. No pertinent family history.  Prior to Admission medications   Medication Sig Start Date End Date Taking? Authorizing Provider  Aspirin-Caffeine (BC FAST PAIN RELIEF) 845-65 MG PACK Take 1 packet by mouth daily as needed (headache/back pain.).   Yes [provider]  Potassium 99 MG TABS Take 891 mg by mouth at bedtime.    Yes [provider]  cyclobenzaprine (FLEXERIL) 10 MG tablet Take 1 tablet (10 mg total) by mouth 3 (three) times daily as needed for muscle  spasms. Patient not taking: Reported on 10/05/2019 09/12/19   Molpus, [name], MD  tamsulosin (FLOMAX) 0.4 MG CAPS capsule Take 1 capsule (0.4 mg total) by mouth daily. Patient not taking: Reported on 04/13/2022 11/23/19   Orpah Greek, MD    Physical Exam: Vitals:   04/13/22 1145 04/13/22 1202 04/13/22 1342 04/13/22 1348  BP:  120/82  120/82  Pulse: 66   (!) 54  Resp: 13    14  Temp:  97.6 F (36.4 C)  98.8 F (37.1 C)  TempSrc:  Oral  Oral  SpO2: 98%   98%  Weight:   106.9 kg   Height:   6' (1.829 m)    General: 36 y.o. male resting in bed in NAD Eyes: PERRL, normal sclera ENMT: Nares patent w/o discharge, orophaynx clear, dentition normal, ears w/o discharge/lesions/ulcers Neck: Supple, trachea midline Cardiovascular: RRR, +S1, S2, no m/g/r, equal pulses throughout Respiratory: CTABL, no w/r/r, normal WOB GI: BS+, NDNT, no masses noted, no organomegaly noted MSK: No e/c/c Neuro: A&O x 3, no focal deficits; symmetrical weakness of BUE (4-5/5 str) and symmetrical weakness of BLE (3-4/5 str) - pattern typical of his previous flares Psyc: Appropriate interaction and affect, calm/cooperative  Data Reviewed:  Results for orders placed or performed during the hospital encounter of 04/13/22 (from the past 24 hour(s))  Comprehensive metabolic panel     Status: Abnormal   Collection Time: 04/13/22  9:35 AM  Result Value Ref Range   Sodium 139 135 - 145 mmol/L   Potassium 2.1 (LL) 3.5 - 5.1 mmol/L   Chloride 119 (H) 98 - 111 mmol/L   CO2 15 (L) 22 - 32 mmol/L   Glucose, Bld 131 (H) 70 - 99 mg/dL   BUN 21 (H) 6 - 20 mg/dL   Creatinine, Ser 1.59 (H) 0.61 - 1.24 mg/dL   Calcium 8.7 (L) 8.9 - 10.3 mg/dL   Total Protein 7.6 6.5 - 8.1 g/dL   Albumin 4.2 3.5 - 5.0 g/dL   AST 21 15 - 41 U/L   ALT 30 0 - 44 U/L   Alkaline Phosphatase 98 38 - 126 U/L   Total Bilirubin 0.7 0.3 - 1.2 mg/dL   GFR, Estimated 58 (L) >60 mL/min   Anion gap 5 5 - 15  CBC with Differential     Status: Abnormal   Collection Time: 04/13/22  9:35 AM  Result Value Ref Range   WBC 10.9 (H) 4.0 - 10.5 K/uL   RBC 6.08 (H) 4.22 - 5.81 MIL/uL   Hemoglobin 18.6 (H) 13.0 - 17.0 g/dL   HCT 52.0 39.0 - 52.0 %   MCV 85.5 80.0 - 100.0 fL   MCH 30.6 26.0 - 34.0 pg   MCHC 35.8 30.0 - 36.0 g/dL   RDW 15.5 11.5 - 15.5 %   Platelets 297 150 - 400 K/uL   nRBC 0.0 0.0 - 0.2 %   Neutrophils  Relative % 68 %   Neutro Abs 7.5 1.7 - 7.7 K/uL   Lymphocytes Relative 20 %   Lymphs Abs 2.2 0.7 - 4.0 K/uL   Monocytes Relative 7 %   Monocytes Absolute 0.8 0.1 - 1.0 K/uL   Eosinophils Relative 3 %   Eosinophils Absolute 0.4 0.0 - 0.5 K/uL   Basophils Relative 1 %   Basophils Absolute 0.1 0.0 - 0.1 K/uL   Immature Granulocytes 1 %   Abs Immature Granulocytes 0.06 0.00 - 0.07 K/uL  Magnesium     Status: None  Collection Time: 04/13/22  9:35 AM  Result Value Ref Range   Magnesium 2.4 1.7 - 2.4 mg/dL  Urinalysis, Routine w reflex microscopic Urine, Clean Catch     Status: Abnormal   Collection Time: 04/13/22 12:09 PM  Result Value Ref Range   Color, Urine STRAW (A) YELLOW   APPearance CLEAR CLEAR   Specific Gravity, Urine 1.010 1.005 - 1.030   pH 7.0 5.0 - 8.0   Glucose, UA NEGATIVE NEGATIVE mg/dL   Hgb urine dipstick TRACE (A) NEGATIVE   Bilirubin Urine NEGATIVE NEGATIVE   Ketones, ur NEGATIVE NEGATIVE mg/dL   Protein, ur NEGATIVE NEGATIVE mg/dL   Nitrite NEGATIVE NEGATIVE   Leukocytes,Ua NEGATIVE NEGATIVE  Urinalysis, Microscopic (reflex)     Status: Abnormal   Collection Time: 04/13/22 12:09 PM  Result Value Ref Range   RBC / HPF 0-5 0 - 5 RBC/hpf   WBC, UA 6-10 0 - 5 WBC/hpf   Bacteria, UA FEW (A) NONE SEEN   Squamous Epithelial / LPF 0-5 0 - 5   EKG: sinus, no st elevation  Assessment and Plan: Hypokalemia Familial hypokalemic periodic paralysis     - place in obs, progressive     - continue K+ supplementation; Mg2+ is ok     - follow BMP     - add bicarb  CKD3a     - he is at baseline  Metabolic acidosis     - check lactic acid     - change fluids to LR     - add PO bicarb   Advance Care Planning:   Code Status: FULL  Consults: None  Family Communication: None at bedside  Severity of Illness: The appropriate patient status for this patient is OBSERVATION. Observation status is judged to be reasonable and necessary in order to provide the required  intensity of service to ensure the patient's safety. The patient's presenting symptoms, physical exam findings, and initial radiographic and laboratory data in the context of their medical condition is felt to place them at decreased risk for further clinical deterioration. Furthermore, it is anticipated that the patient will be medically stable for discharge from the hospital within 2 midnights of admission.   Author: Jonnie Finner, DO 04/13/2022 2:54 PM  For on call review [email or web address].

## 2022-04-13 NOTE — ED Provider Notes (Signed)
[facility] EMERGENCY DEPARTMENT Provider Note   CSN: [id number] Arrival date & time: 04/13/22  1324     History  Chief Complaint  Patient presents with   Weakness    Adam Weaver is a 36 y.o. male.  HPI   Patient with medical history including familial hypokalemic periodic paralysis, CKD presents with complains of low hemoglobin.  Patient states  today he woke up and had generalized weakness, states he has weakness in his upper and lower extremities, no paresthesias, denies any headaches change in vision no recent head trauma, not anticoag, no neck or back pain no fevers or chills.  He states he has  lost an appetite states this is generally the signs when he has low potassium.  He states he took oral potassium without much relief.  He states been taking about 900 to 1800 mg of potassium daily, he denies any nausea vomiting diarrhea denies excessive urination he is not on any diuretics.  He states that this is normal for him he states that he only potassium.  Reviewed patient's chart he has not been here in the last few years but no he has been admitted multiple times for low potassium typically requiring multiple rounds of IV potassium.  Home Medications Prior to Admission medications   Medication Sig Start Date End Date Taking? Authorizing Provider  Aspirin-Caffeine (BC FAST PAIN RELIEF) 845-65 MG PACK Take 1 packet by mouth daily as needed (headache/back pain.).    [provider]  cyclobenzaprine (FLEXERIL) 10 MG tablet Take 1 tablet (10 mg total) by mouth 3 (three) times daily as needed for muscle spasms. Patient not taking: Reported on 10/05/2019 09/12/19   Molpus, [name], MD  naproxen sodium (ALEVE) 220 MG tablet Take 440 mg by mouth daily as needed.    [provider]  oxyCODONE-acetaminophen (PERCOCET) 5-325 MG tablet Take 1-2 tablets by mouth every 4 (four) hours as needed. 11/23/19   Gilda Crease, MD  Potassium 99 MG TABS Take 891 mg by  mouth at bedtime.     [provider]  tamsulosin (FLOMAX) 0.4 MG CAPS capsule Take 1 capsule (0.4 mg total) by mouth daily. 11/23/19   Gilda Crease, MD      Allergies    Ciprofloxacin and Zithromax [azithromycin]    Review of Systems   Review of Systems  Constitutional:  Negative for chills and fever.  Respiratory:  Negative for shortness of breath.   Cardiovascular:  Negative for chest pain.  Gastrointestinal:  Negative for abdominal pain.  Neurological:  Positive for weakness. Negative for headaches.    Physical Exam Updated Vital Signs BP 117/72   Pulse 65   Temp (!) 97.3 F (36.3 C)   Resp 18   SpO2 97%  Physical Exam Vitals and nursing note reviewed.  Constitutional:      General: He is not in acute distress.    Appearance: He is not ill-appearing.  HENT:     Head: Normocephalic and atraumatic.     Nose: No congestion.  Eyes:     Extraocular Movements: Extraocular movements intact.     Conjunctiva/sclera: Conjunctivae normal.     Pupils: Pupils are equal, round, and reactive to light.  Cardiovascular:     Rate and Rhythm: Normal rate and regular rhythm.     Pulses: Normal pulses.  Pulmonary:     Effort: Pulmonary effort is normal.  Skin:    General: Skin is warm and dry.  Neurological:  Mental Status: He is alert.     Comments: No facial asymmetry no difficulty with word finding following two-step commands he has generalized weakness 3-5 strength in the upper and lower extremities not unilateral.  Psychiatric:        Mood and Affect: Mood normal.     ED Results / Procedures / Treatments   Labs (all labs ordered are listed, but only abnormal results are displayed) Labs Reviewed  COMPREHENSIVE METABOLIC PANEL - Abnormal; Notable for the following components:      Result Value   Potassium 2.1 (*)    Chloride 119 (*)    CO2 15 (*)    Glucose, Bld 131 (*)    BUN 21 (*)    Creatinine, Ser 1.59 (*)    Calcium 8.7 (*)    GFR,  Estimated 58 (*)    All other components within normal limits  CBC WITH DIFFERENTIAL/PLATELET - Abnormal; Notable for the following components:   WBC 10.9 (*)    RBC 6.08 (*)    Hemoglobin 18.6 (*)    All other components within normal limits  MAGNESIUM  URINALYSIS, ROUTINE W REFLEX MICROSCOPIC  BASIC METABOLIC PANEL    EKG EKG Interpretation  Date/Time:  Saturday April 13 2022 09:29:38 EDT Ventricular Rate:  72 PR Interval:  172 QRS Duration: 109 QT Interval:  382 QTC Calculation: 418 R Axis:   11 Text Interpretation: Sinus rhythm Normal QTc 418 Confirmed by Vivien Rossetti ([id number]) on 04/13/2022 9:40:57 AM  Radiology No results found.  Procedures .Critical Care  Performed by: Carroll Sage, PA-C Authorized by: Carroll Sage, PA-C   Critical care provider statement:    Critical care time (minutes):  30   Critical care time was exclusive of:  Separately billable procedures and treating other patients   Critical care was necessary to treat or prevent imminent or life-threatening deterioration of the following conditions:  Metabolic crisis   Critical care was time spent personally by me on the following activities:  Development of treatment plan with patient or surrogate, discussions with consultants, evaluation of patient's response to treatment, examination of patient, ordering and review of laboratory studies, ordering and review of radiographic studies, ordering and performing treatments and interventions, pulse oximetry, re-evaluation of patient's condition and review of old charts   I assumed direction of critical care for this patient from another provider in my specialty: no     Care discussed with: admitting provider       Medications Ordered in ED Medications  potassium chloride 10 mEq in 100 mL IVPB (10 mEq Intravenous New Bag/Given 04/13/22 1050)  0.9 %  sodium chloride infusion (has no administration in time range)  potassium chloride 10 mEq in  100 mL IVPB (has no administration in time range)  potassium chloride SA (KLOR-CON M) CR tablet 40 mEq (has no administration in time range)  potassium chloride SA (KLOR-CON M) CR tablet 40 mEq (has no administration in time range)  potassium chloride SA (KLOR-CON M) CR tablet 40 mEq (40 mEq Oral Given 04/13/22 1044)  sodium chloride 0.9 % bolus 1,000 mL (1,000 mLs Intravenous New Bag/Given 04/13/22 1050)    ED Course/ Medical Decision Making/ A&P                           Medical Decision Making Amount and/or Complexity of Data Reviewed Labs: ordered.  Risk Prescription drug management. Decision regarding hospitalization.   This patient presents to  the ED for concern of weakness, this involves an extensive number of treatment options, and is a complaint that carries with it a high risk of complications and morbidity.  The differential diagnosis includes CVA, ACS, sepsis, metabolic derailments    Additional history obtained:  Additional history obtained from partner at bedside External records from outside source obtained and reviewed including previous hospitalizations   Co morbidities that complicate the patient evaluation  Hypokalemia  Social Determinants of Health:  No primary care provider    Lab Tests:  I Ordered, and personally interpreted labs.  The pertinent results include: CBC shows leukocytosis of 10.9 CMP shows potassium 2.1 chloride 119 CO2 15 BUN 21 creatinine 1.5 mag 2.4.   Imaging Studies ordered:  I ordered imaging studies including N/A I independently visualized and interpreted imaging which showed N/A I agree with the radiologist interpretation   Cardiac Monitoring:  The patient was maintained on a cardiac monitor.  I personally viewed and interpreted the cardiac monitored which showed an underlying rhythm of: Without signs of ischemia   Medicines ordered and prescription drug management:  I ordered medication including fluids I have  reviewed the patients home medicines and have made adjustments as needed  Critical Interventions:  Severe hypokalemia-started on IV potassium and oral potassium   Reevaluation:  Presents with generalized weakness, patient states this is typical of his hypokalemia, will obtain basic lab work-up check EKG and reassess.  Significant decrease potassium of 2.1 we will start him on oral and IV potassium is also that he is hemoconcentrated likely is dehydrated we will also provide with fluids.  Reassessed patient resting comfortably having no complaints we will recommend admission for further potassium resuscitation he is agreement this plan will talk with hospitalist.    Consultations Obtained:  I requested consultation with the Dr Margie Ege admitting team,  and discussed lab and imaging findings as well as pertinent plan - they recommend: will admit the patient, patient I at a  stand-alone ED unclear when patient will be transferred recommends a total of 6 rounds of IV potassium, 40 mg of potassium orally 2 times daily and repeat metabolic panel at 330.    Test Considered:  CT head-deferred my suspicion for intracranial bleed is low at this time on anticoag's no recent head trauma presentation is more consistent with known history of low potassium   Rule out I doubt CVA as there is no focal deficits present on exam, he has global generalized weakness none of which is focalized presentations most consistent with known history of hypokalemia.  I have low suspicion for sepsis as he is nontoxic-appearing vital signs are reassuring, he does have slight leukocytosis but I suspect this is falsely elevated due to being hemoconcentrated from dehydration.  I have low suspicion for meningitis as he has no meningeal sign.  I doubt spinal cord compression as she is not having any back tenderness  no recent falls to be atypical for him to develop symmetrical paresthesias in the upper and lower  extremities.    Dispostion and problem list  After consideration of the diagnostic results and the patients response to treatment, I feel that the patent would benefit from admission.  Hypokalemia-secondary due to known history of familial hypokalemia periodical paralysis, patient has ordered a total of 6 rounds of IV potassium as well as 40 mg of oral potassium 2 times daily will need continued monitoring.            Final Clinical Impression(s) /  ED Diagnoses Final diagnoses:  Hypokalemia  Weakness    Rx / DC Orders ED Discharge Orders     None         Marcello Fennel, PA-C 04/13/22 1142    Elgie Congo, MD 04/13/22 1242

## 2022-04-13 NOTE — ED Notes (Signed)
[facility] at bedside 

## 2022-04-13 NOTE — Progress Notes (Signed)
Date and time results received: 04/13/22 1745 (use smartphrase ".now" to insert current time)  Test: Potassium Critical Value: 2.4  Name of Provider Notified: Marylyn Ishihara  Orders Received? Or Actions Taken?: Pending, currently receiving potassium replacements

## 2022-04-14 DIAGNOSIS — F17210 Nicotine dependence, cigarettes, uncomplicated: Secondary | ICD-10-CM | POA: Diagnosis not present

## 2022-04-14 DIAGNOSIS — N1832 Chronic kidney disease, stage 3b: Secondary | ICD-10-CM | POA: Diagnosis not present

## 2022-04-14 DIAGNOSIS — E876 Hypokalemia: Secondary | ICD-10-CM | POA: Diagnosis not present

## 2022-04-14 DIAGNOSIS — R531 Weakness: Secondary | ICD-10-CM | POA: Diagnosis not present

## 2022-04-14 DIAGNOSIS — Z79899 Other long term (current) drug therapy: Secondary | ICD-10-CM | POA: Diagnosis not present

## 2022-04-14 DIAGNOSIS — F17290 Nicotine dependence, other tobacco product, uncomplicated: Secondary | ICD-10-CM | POA: Diagnosis not present

## 2022-04-14 DIAGNOSIS — Z881 Allergy status to other antibiotic agents status: Secondary | ICD-10-CM | POA: Diagnosis not present

## 2022-04-14 DIAGNOSIS — Z87442 Personal history of urinary calculi: Secondary | ICD-10-CM | POA: Diagnosis not present

## 2022-04-14 DIAGNOSIS — E872 Acidosis, unspecified: Secondary | ICD-10-CM | POA: Diagnosis not present

## 2022-04-14 LAB — MAGNESIUM: Magnesium: 2.2 mg/dL (ref 1.7–2.4)

## 2022-04-14 LAB — COMPREHENSIVE METABOLIC PANEL
ALT: 24 U/L (ref 0–44)
AST: 18 U/L (ref 15–41)
Albumin: 3.2 g/dL — ABNORMAL LOW (ref 3.5–5.0)
Alkaline Phosphatase: 69 U/L (ref 38–126)
Anion gap: 3 — ABNORMAL LOW (ref 5–15)
BUN: 18 mg/dL (ref 6–20)
CO2: 17 mmol/L — ABNORMAL LOW (ref 22–32)
Calcium: 8 mg/dL — ABNORMAL LOW (ref 8.9–10.3)
Chloride: 122 mmol/L — ABNORMAL HIGH (ref 98–111)
Creatinine, Ser: 1.47 mg/dL — ABNORMAL HIGH (ref 0.61–1.24)
GFR, Estimated: >60 mL/min (ref >60)
Glucose, Bld: 103 mg/dL — ABNORMAL HIGH (ref 70–99)
Potassium: 2.4 mmol/L — CL (ref 3.5–5.1)
Sodium: 142 mmol/L (ref 135–145)
Total Bilirubin: 0.7 mg/dL (ref 0.3–1.2)
Total Protein: 5.8 g/dL — ABNORMAL LOW (ref 6.5–8.1)

## 2022-04-14 LAB — TSH: TSH: 2.004 u[IU]/mL (ref 0.350–4.500)

## 2022-04-14 LAB — CBC
HCT: 45.4 % (ref 39.0–52.0)
Hemoglobin: 15.7 g/dL (ref 13.0–17.0)
MCH: 30.6 pg (ref 26.0–34.0)
MCHC: 34.6 g/dL (ref 30.0–36.0)
MCV: 88.5 fL (ref 80.0–100.0)
Platelets: 251 10*3/uL (ref 150–400)
RBC: 5.13 MIL/uL (ref 4.22–5.81)
RDW: 15.6 % — ABNORMAL HIGH (ref 11.5–15.5)
WBC: 10.9 10*3/uL — ABNORMAL HIGH (ref 4.0–10.5)
nRBC: 0 % (ref 0.0–0.2)

## 2022-04-14 LAB — PHOSPHORUS: Phosphorus: 3.1 mg/dL (ref 2.5–4.6)

## 2022-04-14 LAB — POTASSIUM: Potassium: 3 mmol/L — ABNORMAL LOW (ref 3.5–5.1)

## 2022-04-14 LAB — HIV ANTIBODY (ROUTINE TESTING W REFLEX): HIV Screen 4th Generation wRfx: NONREACTIVE

## 2022-04-14 MED ORDER — TRAZODONE HCL 50 MG PO TABS
25.0000 mg | ORAL_TABLET | Freq: Every evening | ORAL | Status: DC | PRN
  Administered 2022-04-14: 25 mg via ORAL
  Filled 2022-04-14: qty 1

## 2022-04-14 MED ORDER — POTASSIUM PHOSPHATES 15 MMOLE/5ML IV SOLN
30.0000 mmol | Freq: Once | INTRAVENOUS | Status: AC
  Administered 2022-04-14: 30 mmol via INTRAVENOUS
  Filled 2022-04-14: qty 10

## 2022-04-14 MED ORDER — POTASSIUM CHLORIDE 10 MEQ/100ML IV SOLN
10.0000 meq | INTRAVENOUS | Status: DC
  Administered 2022-04-14: 10 meq via INTRAVENOUS
  Filled 2022-04-14: qty 100

## 2022-04-14 MED ORDER — ORAL CARE MOUTH RINSE: 15.0000 mL | OROMUCOSAL | Status: DC | PRN

## 2022-04-14 MED ORDER — POTASSIUM CHLORIDE CRYS ER 20 MEQ PO TBCR
40.0000 meq | EXTENDED_RELEASE_TABLET | Freq: Three times a day (TID) | ORAL | Status: DC
  Administered 2022-04-14 (×3): 40 meq via ORAL
  Filled 2022-04-14 (×3): qty 2

## 2022-04-14 NOTE — Progress Notes (Signed)
PROGRESS NOTE    Adam Weaver  NID:[id number] DOB: [birth year] DOA: 04/13/2022 PCP: Patient, No Pcp Per    Brief Narrative:  36 year old with history of familial hypokalemic Pediotic paralyzes not on maintenance treatment, CKD stage IIIa presented with generalized weakness poor appetite and typical flareup symptoms not improved with over-the-counter potassium.  Potassium was 2.1 on presentation. Potassium 2.4 after massive replacement.   Assessment & Plan:   Severe symptomatic hypokalemia with hypokalemic periodic paralysis syndrome: Hypophosphatemia: Clinically improving.  Potassium persistently low. Already received more than 200 mEq of potassium last 24 hours, potassium level 2.6. Replace further potassium phosphate 30 meq, continue high-dose oral replacement. Potassium, magnesium and phosphorus every 12 hours.  Mobilize. TSH was normal. Patient will need ongoing care and medical follow-up after discharge.  CKD stage IIIa: At about baseline.      DVT prophylaxis: enoxaparin (LOVENOX) injection 40 mg Start: 04/13/22 1700   Code Status: Full code Family Communication: None  Disposition Plan: Status is: Observation The patient will require care spanning > 2 midnights and should be moved to inpatient because: Severe symptomatic electrolyte abnormalities needing IV replacements     Consultants:  None  Procedures:  None  Antimicrobials:  None   Subjective: Patient seen and examined.  Weakness is improving and was able to walk.  Denies any nausea or vomiting.  Objective: Vitals:   04/13/22 1342 04/13/22 1348 04/13/22 2139 04/14/22 0528  BP:  120/82 127/79 106/60  Pulse:  (!) 54 68 70  Resp:  14 19 20   Temp:  98.8 F (37.1 C) 98.5 F (36.9 C) 98 F (36.7 C)  TempSrc:  Oral Oral   SpO2:  98% 98% 95%  Weight: 106.9 kg     Height: 6' (1.829 m)       Intake/Output Summary (Last 24 hours) at 04/14/2022 1018 Last data filed at 04/14/2022 0300 Gross per 24  hour  Intake 4076.73 ml  Output 2300 ml  Net 1776.73 ml   Filed Weights   04/13/22 1342  Weight: 106.9 kg    Examination:  General exam: Appears calm and comfortable  Respiratory system: Clear to auscultation. Respiratory effort normal. Cardiovascular system: S1 & S2 heard, RRR. No JVD, murmurs, rubs, gallops or clicks. No pedal edema. Gastrointestinal system: Abdomen is nondistended, soft and nontender. No organomegaly or masses felt. Normal bowel sounds heard. Central nervous system: Alert and oriented. No focal neurological deficits. Extremities: Symmetric 5 x 5 power. Skin: No rashes, lesions or ulcers Psychiatry: Judgement and insight appear normal. Mood & affect appropriate.     Data Reviewed: I have personally reviewed following labs and imaging studies  CBC: Recent Labs  Lab 04/13/22 0935 04/14/22 0459  WBC 10.9* 10.9*  NEUTROABS 7.5  --   HGB 18.6* 15.7  HCT 52.0 45.4  MCV 85.5 88.5  PLT 297 144   Basic Metabolic Panel: Recent Labs  Lab 04/13/22 0935 04/13/22 1602 04/13/22 1916 04/14/22 0459  NA 139 140 142 142  K 2.1* 2.4* 2.6* 2.4*  CL 119* 121* 122* 122*  CO2 15* 15* 20* 17*  GLUCOSE 131* 88 96 103*  BUN 21* 18 19 18   CREATININE 1.59* 1.58* 1.59* 1.47*  CALCIUM 8.7* 7.9* 7.9* 8.0*  MG 2.4  --   --   --   PHOS  --   --  1.3*  --    GFR: Estimated Creatinine Clearance: 88.6 mL/min (A) (by C-G formula based on SCr of 1.47 mg/dL (H)). Liver Function Tests: Recent Labs  Lab 04/13/22 0935 04/13/22 1916 04/14/22 0459  AST 21  --  18  ALT 30  --  24  ALKPHOS 98  --  69  BILITOT 0.7  --  0.7  PROT 7.6  --  5.8*  ALBUMIN 4.2 3.5 3.2*   No results for input(s): "LIPASE", "AMYLASE" in the last 168 hours. No results for input(s): "AMMONIA" in the last 168 hours. Coagulation Profile: No results for input(s): "INR", "PROTIME" in the last 168 hours. Cardiac Enzymes: Recent Labs  Lab 04/13/22 1602  CKTOTAL 160   BNP (last 3 results) No  results for input(s): "PROBNP" in the last 8760 hours. HbA1C: No results for input(s): "HGBA1C" in the last 72 hours. CBG: No results for input(s): "GLUCAP" in the last 168 hours. Lipid Profile: No results for input(s): "CHOL", "HDL", "LDLCALC", "TRIG", "CHOLHDL", "LDLDIRECT" in the last 72 hours. Thyroid Function Tests: Recent Labs    04/14/22 0805  TSH 2.004   Anemia Panel: No results for input(s): "VITAMINB12", "FOLATE", "FERRITIN", "TIBC", "IRON", "RETICCTPCT" in the last 72 hours. Sepsis Labs: Recent Labs  Lab 04/13/22 1528  LATICACIDVEN 0.7    No results found for this or any previous visit (from the past 240 hour(s)).       Radiology Studies: No results found.      Scheduled Meds:  enoxaparin (LOVENOX) injection  40 mg Subcutaneous Q24H   melatonin  5 mg Oral QHS   nicotine  14 mg Transdermal Daily   potassium chloride  40 mEq Oral TID   sodium bicarbonate  650 mg Oral BID   Continuous Infusions:  lactated ringers 125 mL/hr at 04/14/22 0628   potassium PHOSPHATE IVPB (in mmol) 30 mmol (04/14/22 0956)     LOS: 0 days    Time spent: 35 minutes    Barb Merino, MD [facility] Pager [phone]

## 2022-04-15 LAB — PHOSPHORUS: Phosphorus: 2.8 mg/dL (ref 2.5–4.6)

## 2022-04-15 LAB — POTASSIUM: Potassium: 3.2 mmol/L — ABNORMAL LOW (ref 3.5–5.1)

## 2022-04-15 LAB — MAGNESIUM: Magnesium: 2.1 mg/dL (ref 1.7–2.4)

## 2022-04-15 MED ORDER — SODIUM BICARBONATE 650 MG PO TABS: 650.0000 mg | ORAL_TABLET | Freq: Two times a day (BID) | ORAL | 0 refills | Status: DC

## 2022-04-15 MED ORDER — POTASSIUM CHLORIDE CRYS ER 20 MEQ PO TBCR: 40.0000 meq | EXTENDED_RELEASE_TABLET | Freq: Two times a day (BID) | ORAL | 0 refills | Status: DC

## 2022-04-15 NOTE — Discharge Summary (Signed)
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  Potassium was 2.1 on presentation. Patient was admitted to the hospital and treated with massive potassium replacement, receiving about 300 mEq of potassium in last 48 hours.  His potassium is 3.2.  His clinical symptoms have improved.  Bicarb is 15.  Plan: With clinical improvement he will be discharged home on high-dose potassium chloride 40 mill equivalent 2 times a day to continue.  He has a new primary care physician follow-up as scheduled on 10/24, he will need repeat lab test and may need increasing dose of potassium.  Magnesium and phosphorus are normalized. Patient does take long-term sodium bicarb and was currently not taking.  This was prescribed. Advised to take avoid any potassium losing diuretics or any over-the-counter medications.  Discharge Diagnoses:  Principal Problem:   Hypokalemia Active Problems:   Hypokalemia due to excessive renal loss of potassium    Discharge Instructions  Discharge Instructions     Call MD for:  extreme fatigue   Complete by: As directed    Diet general   Complete by: As directed    Increase activity slowly   Complete by: As directed       Allergies as of 04/15/2022       Reactions   Ciprofloxacin Anaphylaxis    Zithromax [azithromycin] Rash        Medication List     STOP taking these medications    cyclobenzaprine 10 MG tablet Commonly known as: FLEXERIL   Potassium 99 MG Tabs   tamsulosin 0.4 MG Caps capsule Commonly known as: FLOMAX       TAKE these medications    BC Fast Pain Relief 845-65 MG Pack Generic drug: Aspirin-Caffeine Take 1 packet by mouth daily as needed (headache/back pain.).   potassium chloride SA 20 MEQ tablet Commonly known as: KLOR-CON M Take 2 tablets (40 mEq total) by mouth 2 (two) times daily for 14 days.   sodium bicarbonate 650 MG tablet Take 1 tablet (650 mg total) by mouth 2 (two) times daily.        Follow-up Information     Bonnita Hollow, MD Follow up.   Specialty: Family Medicine Contact information: [place] [address] [phone]                Allergies  Allergen Reactions   Ciprofloxacin Anaphylaxis   Zithromax [Azithromycin] Rash    Consultations: None   Procedures/Studies: No results found. (Echo, Carotid, EGD, Colonoscopy, ERCP)    Subjective: Patient seen in the morning rounds.  Up about and walking in the hallway.  Denies any complaints.  Wants to go home.   Discharge Exam: Vitals:   04/14/22 2052 04/15/22 0453  BP: 125/79 109/70  Pulse: 69 78  Resp: 20 (!) 21  Temp: 97.8 F (36.6 C) 98.2 F (36.8 C)  SpO2: 97% 97%   Vitals:   04/14/22 0528 04/14/22 1337 04/14/22  2052 04/15/22 0453  BP: 106/60 112/72 125/79 109/70  Pulse: 70 72 69 78  Resp: 20 18 20  (!) 21  Temp: 98 F (36.7 C) 97.9 F (36.6 C) 97.8 F (36.6 C) 98.2 F (36.8 C)  TempSrc:  Oral Oral Oral  SpO2: 95% 97% 97% 97%  Weight:      Height:        General: Pt is alert, awake, not in acute distress Cardiovascular: RRR, S1/S2 +, no rubs, no gallops Respiratory: CTA bilaterally, no wheezing, no rhonchi Abdominal: Soft, NT, ND, bowel sounds + Extremities: no edema, no cyanosis    The results  of significant diagnostics from this hospitalization (including imaging, microbiology, ancillary and laboratory) are listed below for reference.     Microbiology: No results found for this or any previous visit (from the past 240 hour(s)).   Labs: BNP (last 3 results) No results for input(s): "BNP" in the last 8760 hours. Basic Metabolic Panel: Recent Labs  Lab 04/13/22 0935 04/13/22 1602 04/13/22 1916 04/14/22 0459 04/14/22 1702 04/15/22 0536  NA 139 140 142 142  --   --   K 2.1* 2.4* 2.6* 2.4* 3.0* 3.2*  CL 119* 121* 122* 122*  --   --   CO2 15* 15* 20* 17*  --   --   GLUCOSE 131* 88 96 103*  --   --   BUN 21* 18 19 18   --   --   CREATININE 1.59* 1.58* 1.59* 1.47*  --   --   CALCIUM 8.7* 7.9* 7.9* 8.0*  --   --   MG 2.4  --   --   --  2.2 2.1  PHOS  --   --  1.3*  --  3.1 2.8   Liver Function Tests: Recent Labs  Lab 04/13/22 0935 04/13/22 1916 04/14/22 0459  AST 21  --  18  ALT 30  --  24  ALKPHOS 98  --  69  BILITOT 0.7  --  0.7  PROT 7.6  --  5.8*  ALBUMIN 4.2 3.5 3.2*   No results for input(s): "LIPASE", "AMYLASE" in the last 168 hours. No results for input(s): "AMMONIA" in the last 168 hours. CBC: Recent Labs  Lab 04/13/22 0935 04/14/22 0459  WBC 10.9* 10.9*  NEUTROABS 7.5  --   HGB 18.6* 15.7  HCT 52.0 45.4  MCV 85.5 88.5  PLT 297 251   Cardiac Enzymes: Recent Labs  Lab 04/13/22 1602  CKTOTAL 160   BNP: Invalid input(s): "POCBNP" CBG: No results for input(s): "GLUCAP" in the last 168 hours. D-Dimer No results for input(s): "DDIMER" in the last 72 hours. Hgb A1c No results for input(s): "HGBA1C" in the last 72 hours. Lipid Profile No results for input(s): "CHOL", "HDL", "LDLCALC", "TRIG", "CHOLHDL", "LDLDIRECT" in the last 72 hours. Thyroid function studies Recent Labs    04/14/22 0805  TSH 2.004   Anemia work up No results for input(s): "VITAMINB12", "FOLATE", "FERRITIN", "TIBC", "IRON", "RETICCTPCT" in the last 72  hours. Urinalysis    Component Value Date/Time   COLORURINE STRAW (A) 04/13/2022 1209   APPEARANCEUR CLEAR 04/13/2022 1209   LABSPEC 1.010 04/13/2022 1209   PHURINE 7.0 04/13/2022 1209   GLUCOSEU NEGATIVE 04/13/2022 1209   HGBUR TRACE (A) 04/13/2022 [place] 04/13/2022 [place] 04/13/2022 1209   PROTEINUR NEGATIVE 04/13/2022 1209   NITRITE NEGATIVE 04/13/2022 1209   LEUKOCYTESUR NEGATIVE 04/13/2022 1209   Sepsis Labs Recent Labs  Lab 04/13/22 0935 04/14/22 0459  WBC 10.9* 10.9*   Microbiology No results found for this or any previous visit (from the past 240 hour(s)).   Time coordinating discharge: 35 minutes  SIGNED:   Barb Merino, MD  [facility] 04/15/2022, 8:53 AM

## 2022-04-15 NOTE — TOC Transition Note (Signed)
Transition of Care [facility]) - CM/SW Discharge Note   Patient Details  Name: Kamron Vanwyhe MRN: [id number] Date of Birth: [birth year]  Transition of Care [facility]) CM/SW Contact:  Leeroy Cha, RN Phone Number: 04/15/2022, 9:01 AM   Clinical Narrative:    Discharged to return home with self care    Final next level of care: Home/Self Care Barriers to Discharge: Barriers Resolved   Patient Goals and CMS Choice Patient states their goals for this hospitalization and ongoing recovery are:: to go home CMS Medicare.gov Compare Post Acute Care list provided to:: Patient    Discharge Placement                       Discharge Plan and Services   Discharge Planning Services: CM Consult                                 Social Determinants of Health (SDOH) Interventions     Readmission Risk Interventions   No data to display

## 2022-04-15 NOTE — TOC Initial Note (Signed)
Transition of Care [facility]) - Initial/Assessment Note    Patient Details  Name: Adam Weaver MRN: [id number] Date of Birth: [birth year]  Transition of Care [facility]) CM/SW Contact:    Leeroy Cha, RN Phone Number: 04/15/2022, 9:01 AM  Clinical Narrative:                  Transition of Care [facility]) Screening Note   Patient Details  Name: Adam Weaver Date of Birth: [birth year]   Transition of Care [facility]) CM/SW Contact:    Leeroy Cha, RN Phone Number: 04/15/2022, 9:01 AM    Transition of Care Department [facility]) has reviewed patient and no TOC needs have been identified at this time. We will continue to monitor patient advancement through interdisciplinary progression rounds. If new patient transition needs arise, please place a TOC consult.    Expected Discharge Plan: Home/Self Care Barriers to Discharge: Barriers Resolved   Patient Goals and CMS Choice Patient states their goals for this hospitalization and ongoing recovery are:: to go home CMS Medicare.gov Compare Post Acute Care list provided to:: Patient    Expected Discharge Plan and Services Expected Discharge Plan: Home/Self Care   Discharge Planning Services: CM Consult   Living arrangements for the past 2 months: Mobile Home Expected Discharge Date: 04/15/22                                    Prior Living Arrangements/Services Living arrangements for the past 2 months: Mobile Home   Patient language and need for interpreter reviewed:: Yes Do you feel safe going back to the place where you live?: Yes            Criminal Activity/Legal Involvement Pertinent to Current Situation/Hospitalization: No - Comment as needed  Activities of Daily Living Home Assistive Devices/Equipment: None ADL Screening (condition at time of admission) Patient's cognitive ability adequate to safely complete daily activities?: Yes Is the patient deaf or have difficulty hearing?: No Does the patient have  difficulty seeing, even when wearing glasses/contacts?: No Does the patient have difficulty concentrating, remembering, or making decisions?: No Patient able to express need for assistance with ADLs?: Yes Does the patient have difficulty dressing or bathing?: No Independently performs ADLs?: Yes (appropriate for developmental age) Does the patient have difficulty walking or climbing stairs?: No Weakness of Legs: None Weakness of Arms/Hands: None  Permission Sought/Granted                  Emotional Assessment Appearance:: Appears stated age Attitude/Demeanor/Rapport: Engaged Affect (typically observed): Calm Orientation: : Oriented to Self, Oriented to Place, Oriented to  Time, Oriented to Situation Alcohol / Substance Use: Not Applicable Psych Involvement: No (comment)  Admission diagnosis:  Hypokalemia [E87.6] Weakness [R53.1] Hypokalemia due to excessive renal loss of potassium [E87.6] Patient Active Problem List   Diagnosis Date Noted   Hypokalemia due to excessive renal loss of potassium 04/14/2022   Nephrolithiasis 10/26/2019   Acute kidney injury superimposed on CKD ([place]) 06/22/2019   Acute lower UTI 06/22/2019   Right nephrolithiasis 06/22/2019   Obesity (BMI 30-39.9) 06/22/2019   Erythrocytosis 06/22/2019   Hyperglycemia 06/22/2019   Bronchitis    Hypokalemic periodic paralysis 03/16/2018   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

## 2022-04-16 ENCOUNTER — Telehealth: Payer: Self-pay

## 2022-04-16 NOTE — Patient Outreach (Signed)
Care Coordination  04/16/2022  Adam Weaver September 20, 1985 [id number]  Transition Care Management Follow-up Telephone Call Date of discharge and from where: 16/16/23 Adam Weaver How have you been since you were released from the hospital? Feeling well Any questions or concerns? Yes  Items Reviewed: Did the pt receive and understand the discharge instructions provided? Yes  Medications obtained and verified? Yes  Other? No  Any new allergies since your discharge? No  Dietary orders reviewed? No Do you have support at home? Yes   Home Care and Equipment/Supplies: Were home health services ordered? not applicable If so, what is the name of the agency?  Has the agency set up a time to come to the patient's home?  Were any new equipment or medical supplies ordered?   What is the name of the medical supply agency?  Were you able to get the supplies/equipment?  Do you have any questions related to the use of the equipment or supplies?   Functional Questionnaire: (I = Independent and D = Dependent) ADLs: I  Bathing/Dressing- I  Meal Prep- I  Eating- I  Maintaining continence- I  Transferring/Ambulation- I  Managing Meds- I  Follow up appointments reviewed:  PCP Hospital f/u appt confirmed? Yes  Scheduled to see Adam Weaver on 04/23/22 @ [place] [facility] f/u appt confirmed? No  Scheduled to see  on  Are transportation arrangements needed? No  If their condition worsens, is the pt aware to call PCP or go to the Emergency Dept.? Yes Was the patient provided with contact information for the PCP's office or ED? Yes Was to pt encouraged to call back with questions or concerns? Yes  Mickel Fuchs, BSW, [place] Managed Medicaid Team  [phone]

## 2022-04-23 ENCOUNTER — Ambulatory Visit: Payer: Medicaid Other | Admitting: Family Medicine

## 2022-04-23 ENCOUNTER — Encounter: Payer: Self-pay | Admitting: Family Medicine

## 2022-04-23 VITALS — BP 116/78 | HR 70 | Temp 97.7°F | Ht 69.5 in | Wt 234.2 lb

## 2022-04-23 DIAGNOSIS — F332 Major depressive disorder, recurrent severe without psychotic features: Secondary | ICD-10-CM

## 2022-04-23 DIAGNOSIS — F319 Bipolar disorder, unspecified: Secondary | ICD-10-CM | POA: Diagnosis not present

## 2022-04-23 DIAGNOSIS — N1831 Chronic kidney disease, stage 3a: Secondary | ICD-10-CM

## 2022-04-23 DIAGNOSIS — F429 Obsessive-compulsive disorder, unspecified: Secondary | ICD-10-CM | POA: Diagnosis not present

## 2022-04-23 DIAGNOSIS — G723 Periodic paralysis: Secondary | ICD-10-CM | POA: Diagnosis not present

## 2022-04-23 DIAGNOSIS — Z23 Encounter for immunization: Secondary | ICD-10-CM | POA: Diagnosis not present

## 2022-04-23 DIAGNOSIS — D751 Secondary polycythemia: Secondary | ICD-10-CM | POA: Diagnosis not present

## 2022-04-23 DIAGNOSIS — R45851 Suicidal ideations: Secondary | ICD-10-CM

## 2022-04-23 DIAGNOSIS — F5101 Primary insomnia: Secondary | ICD-10-CM | POA: Diagnosis not present

## 2022-04-23 DIAGNOSIS — F909 Attention-deficit hyperactivity disorder, unspecified type: Secondary | ICD-10-CM | POA: Diagnosis not present

## 2022-04-23 DIAGNOSIS — F602 Antisocial personality disorder: Secondary | ICD-10-CM | POA: Diagnosis not present

## 2022-04-23 LAB — BASIC METABOLIC PANEL
BUN: 18 mg/dL (ref 6–23)
CO2: 20 mEq/L (ref 19–32)
Calcium: 9 mg/dL (ref 8.4–10.5)
Chloride: 113 mEq/L — ABNORMAL HIGH (ref 96–112)
Creatinine, Ser: 1.5 mg/dL (ref 0.40–1.50)
GFR: 59.92 mL/min — ABNORMAL LOW (ref >60.00)
Glucose, Bld: 96 mg/dL (ref 70–99)
Potassium: 3.8 mEq/L (ref 3.5–5.1)
Sodium: 141 mEq/L (ref 135–145)

## 2022-04-23 LAB — CBC WITH DIFFERENTIAL/PLATELET
Basophils Absolute: 0.1 10*3/uL (ref 0.0–0.1)
Basophils Relative: 0.7 % (ref 0.0–3.0)
Eosinophils Absolute: 0.3 10*3/uL (ref 0.0–0.7)
Eosinophils Relative: 3.7 % (ref 0.0–5.0)
HCT: 54.1 % — ABNORMAL HIGH (ref 39.0–52.0)
Hemoglobin: 17.8 g/dL — ABNORMAL HIGH (ref 13.0–17.0)
Lymphocytes Relative: 26.9 % (ref 12.0–46.0)
Lymphs Abs: 2 10*3/uL (ref 0.7–4.0)
MCHC: 32.9 g/dL (ref 30.0–36.0)
MCV: 91.7 fl (ref 78.0–100.0)
Monocytes Absolute: 0.5 10*3/uL (ref 0.1–1.0)
Monocytes Relative: 6.5 % (ref 3.0–12.0)
Neutro Abs: 4.7 10*3/uL (ref 1.4–7.7)
Neutrophils Relative %: 62.2 % (ref 43.0–77.0)
Platelets: 240 10*3/uL (ref 150.0–400.0)
RBC: 5.9 Mil/uL — ABNORMAL HIGH (ref 4.22–5.81)
RDW: 15 % (ref 11.5–15.5)
WBC: 7.5 10*3/uL (ref 4.0–10.5)

## 2022-04-23 MED ORDER — SODIUM BICARBONATE 650 MG PO TABS: 650.0000 mg | ORAL_TABLET | Freq: Two times a day (BID) | ORAL | 1 refills | Status: DC

## 2022-04-23 MED ORDER — POTASSIUM CHLORIDE CRYS ER 20 MEQ PO TBCR: 40.0000 meq | EXTENDED_RELEASE_TABLET | Freq: Two times a day (BID) | ORAL | 1 refills | Status: DC
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
## 2022-04-23 NOTE — Patient Instructions (Addendum)
For urgent psychiatric assistance you can go to   [facility] Phone: [phone]  [address] [place], [place] [address]  Hours: Open 24/7, No appointment required.  [place], [place] [address] Phone: [phone]  When you're going through tough times, it's easy to feel lonely and overwhelmed. Remember that YOU ARE NOT ALONE and we at [facility] want to support you during this difficult time.  There are many ways to get help and support when you are ready.  You can call the following help lines: - National suicide hotline 417-306-0142) - [address] (1-800-SUICIDE)   You can schedule an appointment with a team member with your primary care doc or one of our counselors.  We are all here to support you.  A doctor is on call 24/7   There are many treatments that can help people during difficult times, and we can talk with you about medications, counseling, diet, and other options. We want to offer you HOPE that it won't always feel this bad.   If you are seriously thinking about hurting yourself or have a plan, please call 911 or go to any emergency room right away for immediate help.

## 2022-04-24 ENCOUNTER — Telehealth: Payer: Self-pay | Admitting: Hematology

## 2022-04-24 NOTE — Telephone Encounter (Signed)
Scheduled appt per 10/24 referral. Pt is aware of appt date and time. Pt is aware to arrive 15 mins prior to appt time and to bring and updated insurance card. Pt is aware of appt location.   

## 2022-04-30 ENCOUNTER — Encounter: Payer: Self-pay | Admitting: Nurse Practitioner

## 2022-04-30 ENCOUNTER — Ambulatory Visit: Payer: Medicaid Other | Admitting: Nurse Practitioner

## 2022-04-30 VITALS — BP 136/96 | Temp 97.7°F | Wt 240.2 lb

## 2022-04-30 DIAGNOSIS — K649 Unspecified hemorrhoids: Secondary | ICD-10-CM | POA: Diagnosis not present

## 2022-04-30 MED ORDER — HYDROCORTISONE ACETATE 25 MG RE SUPP: 25.0000 mg | Freq: Two times a day (BID) | RECTAL | 0 refills | Status: DC

## 2022-04-30 NOTE — Progress Notes (Signed)
   Acute Office Visit  Subjective:     Patient ID: Adam Weaver, male    DOB: 01-27-1986, 36 y.o.   MRN: [id number]  Chief Complaint  Patient presents with   Hemorrhoids    Pt c/o possible hemorrhoids w/ painful bowel movements and anal irritation x1 day    HPI Patient is in today for woke up yesterday with rectal pain. He went to the bathroom and felt something when he wiped. He denies rectal bleeding and constipation. He states that he was sitting on the toilet for a few hours the other day. He has never had this issue before. He has tried preparation H, witch hazel, and lidocaine cream. The lidocaine cream helps for an hour or two and the pain comes back.   ROS See pertinent positives and negatives per HPI.     Objective:    BP (!) 136/96   Temp 97.7 F (36.5 C) (Temporal)   Wt 240 lb 3.2 oz (109 kg)   BMI 34.96 kg/m    Physical Exam Vitals and nursing note reviewed. Exam conducted with a chaperone present.  Constitutional:      Appearance: Normal appearance.  HENT:     Head: Normocephalic.  Eyes:     Conjunctiva/sclera: Conjunctivae normal.  Pulmonary:     Effort: Pulmonary effort is normal.  Genitourinary:    Rectum: Tenderness and external hemorrhoid present.  Musculoskeletal:     Cervical back: Normal range of motion.  Skin:    General: Skin is warm.  Neurological:     General: No focal deficit present.     Mental Status: He is alert and oriented to person, place, and time.  Psychiatric:        Mood and Affect: Mood normal.        Behavior: Behavior normal.        Thought Content: Thought content normal.        Judgment: Judgment normal.       Assessment & Plan:   Problem List Items Addressed This Visit       Cardiovascular and Mediastinum   Hemorrhoids - Primary    External hemorrhoid noted on exam. Will have him start annusol suppository BID and continue lidocaine cream and witch hazel. Also discussed sitz baths. Encouraged him not to  sit/strain on toilet for long periods of time. Drink plenty of fluids and can use miralax prn to help with any constipation. Follow-up if not improving.        Meds ordered this encounter  Medications   hydrocortisone (ANUSOL-HC) 25 MG suppository    Sig: Place 1 suppository (25 mg total) rectally 2 (two) times daily.    Dispense:  12 suppository    Refill:  0    Return if symptoms worsen or fail to improve.  Charyl Dancer, NP

## 2022-04-30 NOTE — Assessment & Plan Note (Signed)
External hemorrhoid noted on exam. Will have him start annusol suppository BID and continue lidocaine cream and witch hazel. Also discussed sitz baths. Encouraged him not to sit/strain on toilet for long periods of time. Drink plenty of fluids and can use miralax prn to help with any constipation. Follow-up if not improving.

## 2022-04-30 NOTE — Patient Instructions (Signed)
It was great to see you!  Start annusol suppository twice a day. You can using the lidocaine cream or witch hazel. You can also do sitz baths for comfort.   Let's follow-up if your symptoms worsen or don't improve.   Take care,  Vance Peper, NP

## 2022-05-01 DIAGNOSIS — Z419 Encounter for procedure for purposes other than remedying health state, unspecified: Secondary | ICD-10-CM | POA: Diagnosis not present

## 2022-05-10 ENCOUNTER — Inpatient Hospital Stay: Payer: Medicaid Other

## 2022-05-10 ENCOUNTER — Inpatient Hospital Stay: Payer: Medicaid Other | Attending: Hematology | Admitting: Hematology

## 2022-05-10 VITALS — BP 151/93 | HR 118 | Temp 98.4°F | Resp 16 | Wt 237.4 lb

## 2022-05-10 DIAGNOSIS — F17210 Nicotine dependence, cigarettes, uncomplicated: Secondary | ICD-10-CM | POA: Diagnosis not present

## 2022-05-10 DIAGNOSIS — D751 Secondary polycythemia: Secondary | ICD-10-CM

## 2022-05-10 DIAGNOSIS — N183 Chronic kidney disease, stage 3 unspecified: Secondary | ICD-10-CM

## 2022-05-10 LAB — CBC WITH DIFFERENTIAL (CANCER CENTER ONLY)
Abs Immature Granulocytes: 0.03 10*3/uL (ref 0.00–0.07)
Basophils Absolute: 0.1 10*3/uL (ref 0.0–0.1)
Basophils Relative: 1 %
Eosinophils Absolute: 0.4 10*3/uL (ref 0.0–0.5)
Eosinophils Relative: 3 %
HCT: 55.4 % — ABNORMAL HIGH (ref 39.0–52.0)
Hemoglobin: 19 g/dL — ABNORMAL HIGH (ref 13.0–17.0)
Immature Granulocytes: 0 %
Lymphocytes Relative: 21 %
Lymphs Abs: 2.5 10*3/uL (ref 0.7–4.0)
MCH: 30.5 pg (ref 26.0–34.0)
MCHC: 34.3 g/dL (ref 30.0–36.0)
MCV: 88.9 fL (ref 80.0–100.0)
Monocytes Absolute: 0.6 10*3/uL (ref 0.1–1.0)
Monocytes Relative: 5 %
Neutro Abs: 8.7 10*3/uL — ABNORMAL HIGH (ref 1.7–7.7)
Neutrophils Relative %: 70 %
Platelet Count: 315 10*3/uL (ref 150–400)
RBC: 6.23 MIL/uL — ABNORMAL HIGH (ref 4.22–5.81)
RDW: 13.9 % (ref 11.5–15.5)
WBC Count: 12.3 10*3/uL — ABNORMAL HIGH (ref 4.0–10.5)
nRBC: 0 % (ref 0.0–0.2)

## 2022-05-10 LAB — CMP (CANCER CENTER ONLY)
ALT: 24 U/L (ref 0–44)
AST: 16 U/L (ref 15–41)
Albumin: 4.5 g/dL (ref 3.5–5.0)
Alkaline Phosphatase: 105 U/L (ref 38–126)
Anion gap: 7 (ref 5–15)
BUN: 18 mg/dL (ref 6–20)
CO2: 20 mmol/L — ABNORMAL LOW (ref 22–32)
Calcium: 9.2 mg/dL (ref 8.9–10.3)
Chloride: 115 mmol/L — ABNORMAL HIGH (ref 98–111)
Creatinine: 2.04 mg/dL — ABNORMAL HIGH (ref 0.61–1.24)
GFR, Estimated: 43 mL/min — ABNORMAL LOW (ref >60)
Glucose, Bld: 91 mg/dL (ref 70–99)
Potassium: 3.2 mmol/L — ABNORMAL LOW (ref 3.5–5.1)
Sodium: 142 mmol/L (ref 135–145)
Total Bilirubin: 0.5 mg/dL (ref 0.3–1.2)
Total Protein: 7.7 g/dL (ref 6.5–8.1)

## 2022-05-10 LAB — MAGNESIUM: Magnesium: 2.5 mg/dL — ABNORMAL HIGH (ref 1.7–2.4)

## 2022-05-10 NOTE — Progress Notes (Signed)
Adam Kitchen   HEMATOLOGY/ONCOLOGY CONSULTATION NOTE  Date of Service: 05/10/2022  Patient Care Team: Garnette Gunner, MD as PCP - General (Family Medicine)  CHIEF COMPLAINTS/PURPOSE OF CONSULTATION:  Polycythemia  HISTORY OF PRESENTING ILLNESS:   Adam Weaver is a wonderful 36 y.o. male who has been referred to [place] by Dr.Thompson, Ebbie Ridge, MD for evaluation and management of polycythemia.  Patient has a history of hypokalemic periodic paralysis, renal stones, chronic kidney disease stage III, depression, anxiety who was last seen by his primary care physician on 04/19/2022 and on labs noted to have an elevated hemoglobin of 17.8 with a hematocrit of 54.1 with a normal WBC count of 7.5k and normal platelets of 240k. He was referred to [place] for further evaluation of his polycythemia. In reviewing his available labs from 2019 through recently he has had hematocrit that fluctuated between 41.7 up to 55.  He has had normal hematocrits in between significantly elevated hematocrits and does not show a clear progressive increase in his hemoglobin/hematocrit in a linear fashion. Recently he has had a hematocrit of 52 on 04/13/2022 followed by hematocrit of 45.4 on 04/14/2022 and a hematocrit of 54.1 on 04/19/2022. He notes that he does not drink much water. He has been on high-dose potassium and bicarbonate replacement for his hypokalemic periodic paralysis. Patient notes no family history of known genetic blood disorders. No headaches.  No previous history of venous thromboembolism or other arterial blood clots. No use of testosterone or other testosterone supplements. He has been a regular smoker and has smoked as much as 3 packs/day in the past but has been cutting down to about a pack and a half a day currently. He notes he consumes about 1 can of beer a week and has not had more significant alcohol use. He reports that he has had recurrent kidney stones and that has been quite a problem and has seen  several urologists in the past.  He notes that he has been referred to [facility] for his stage III chronic kidney disease.  No recent abdominal pain or distention.  No shortness of breath or chest pain.  No change in vision.  No new headaches.     MEDICAL HISTORY:  Past Medical History:  Diagnosis Date   Anxiety 2018   Depression 2016   History of kidney stones    Hypokalemia    Hypokalemic periodic paralysis    POTASSIUM GETS LOW TROUBLE MOVING DAY AFTER SURGERY   Kidney stones    Renal disorder    STAGE 3 CKD, NO NEPHROLOGIST    SURGICAL HISTORY: Past Surgical History:  Procedure Laterality Date   CYSTOSCOPY W/ URETERAL STENT PLACEMENT Right 06/21/2019   Procedure: CYSTOSCOPY WITH RIGHT URETERAL STENT PLACEMENT;  Surgeon: Noel Christmas, MD;  Location: [facility];  Service: Urology;  Laterality: Right;   CYSTOSCOPY WITH RETROGRADE PYELOGRAM, URETEROSCOPY AND STENT PLACEMENT Right 07/20/2019   Procedure: CYSTOSCOPY WITH RETROGRADE PYELOGRAM, URETEROSCOPY AND STENT PLACEMENT;  Surgeon: Noel Christmas, MD;  Location: [facility];  Service: Urology;  Laterality: Right;  90 MINS   HOLMIUM LASER APPLICATION Right 07/20/2019   Procedure: HOLMIUM LASER APPLICATION;  Surgeon: Noel Christmas, MD;  Location: [facility];  Service: Urology;  Laterality: Right;   HOLMIUM LASER APPLICATION Right 10/26/2019   Procedure: HOLMIUM LASER APPLICATION;  Surgeon: Noel Christmas, MD;  Location: [facility];  Service: Urology;  Laterality: Right;   IR URETERAL STENT RIGHT NEW ACCESS W/O SEP  NEPHROSTOMY CATH  10/26/2019   LITHOTRIPSY     NEPHROLITHOTOMY Right 10/26/2019   Procedure: NEPHROLITHOTOMY PERCUTANEOUS;  Surgeon: Noel Christmas, MD;  Location: [facility];  Service: Urology;  Laterality: Right;  3 HRS   PINS IN RIGHT ARM     LATER REMOVED    SOCIAL HISTORY: Social History   Socioeconomic History   Marital status: Significant Other    Spouse name:  Not on file   Number of children: Not on file   Years of education: Not on file   Highest education level: Not on file  Occupational History   Not on file  Tobacco Use   Smoking status: Every Day    Packs/day: 1.00    Years: 3.00    Total pack years: 3.00    Types: Cigarettes, E-cigarettes    Passive exposure: Never   Smokeless tobacco: Never  Vaping Use   Vaping Use: Former  Substance and Sexual Activity   Alcohol use: Yes    Alcohol/week: 1.0 standard drink of alcohol    Types: 1 Cans of beer per week    Comment: RARE   Drug use: Never   Sexual activity: Yes    Birth control/protection: None  Other Topics Concern   Not on file  Social History Narrative   Not on file   Social Determinants of Health   Financial Resource Strain: Not on file  Food Insecurity: Food Insecurity Present (04/13/2022)   Hunger Vital Sign    Worried About Running Out of Food in the Last Year: Sometimes true    Ran Out of Food in the Last Year: Never true  Transportation Needs: No Transportation Needs (04/13/2022)   PRAPARE - Administrator, Civil Service (Medical): No    Lack of Transportation (Non-Medical): No  Physical Activity: Not on file  Stress: Not on file  Social Connections: Not on file  Intimate Partner Violence: Not At Risk (04/13/2022)   Humiliation, Afraid, Rape, and Kick questionnaire    Fear of Current or Ex-Partner: No    Emotionally Abused: No    Physically Abused: No    Sexually Abused: No    FAMILY HISTORY: Family History  Problem Relation Age of Onset   Diabetes Mother    Heart disease Mother    Hypertension Mother    COPD Father    Arthritis Maternal Grandfather    Arthritis Maternal Grandmother    Cancer Maternal Aunt    Kidney disease Maternal Aunt     ALLERGIES:  is allergic to ciprofloxacin and zithromax [azithromycin].  MEDICATIONS:  Current Outpatient Medications  Medication Sig Dispense Refill   Aspirin-Caffeine (BC FAST PAIN RELIEF)  845-65 MG PACK Take 1 packet by mouth daily as needed (headache/back pain.).     potassium chloride SA (KLOR-CON M) 20 MEQ tablet Take 2 tablets (40 mEq total) by mouth 2 (two) times daily. 360 tablet 1   sodium bicarbonate 650 MG tablet Take 1 tablet (650 mg total) by mouth 2 (two) times daily. 180 tablet 1   hydrocortisone (ANUSOL-HC) 25 MG suppository Place 1 suppository (25 mg total) rectally 2 (two) times daily. (Patient not taking: Reported on 05/10/2022) 12 suppository 0   No current facility-administered medications for this visit.    REVIEW OF SYSTEMS:    10 Point review of Systems was done is negative except as noted above.  PHYSICAL EXAMINATION: ECOG PERFORMANCE STATUS: 1 - Symptomatic but completely ambulatory  . Vitals:   05/10/22 1433  BP: Adam Kitchen)  151/93  Pulse: (!) 118  Resp: 16  Temp: 98.4 F (36.9 C)  SpO2: 96%   Filed Weights   05/10/22 1433  Weight: 237 lb 6.4 oz (107.7 kg)   .Body mass index is 34.56 kg/m.  GENERAL:alert, in no acute distress and comfortable SKIN: no acute rashes, no significant lesions EYES: conjunctiva are pink and non-injected, sclera anicteric OROPHARYNX: MMM, no exudates, no oropharyngeal erythema or ulceration NECK: supple, no JVD LYMPH:  no palpable lymphadenopathy in the cervical, axillary or inguinal regions LUNGS: clear to auscultation b/l with normal respiratory effort HEART: regular rate & rhythm ABDOMEN:  normoactive bowel sounds , non tender, not distended.  No palpable hepatosplenomegaly Extremity: no pedal edema PSYCH: alert & oriented x 3 with fluent speech NEURO: no focal motor/sensory deficits  LABORATORY DATA:  I have reviewed the data as listed  .    Latest Ref Rng & Units 05/10/2022    3:50 PM 04/23/2022    9:21 AM 04/14/2022    4:59 AM  CBC  WBC 4.0 - 10.5 K/uL 12.3  7.5  10.9   Hemoglobin 13.0 - 17.0 g/dL 65.7  84.6 Repeated and verified X2.  15.7   Hematocrit 39.0 - 52.0 % 55.4  54.1  45.4   Platelets  150 - 400 K/uL 315  240.0  251     .    Latest Ref Rng & Units 05/10/2022    3:50 PM 04/23/2022    9:21 AM 04/15/2022    5:36 AM  CMP  Glucose 70 - 99 mg/dL 91  96    BUN 6 - 20 mg/dL 18  18    Creatinine 9.62 - 1.24 mg/dL 9.52  8.41    Sodium 324 - 145 mmol/L 142  141    Potassium 3.5 - 5.1 mmol/L 3.2  3.8  3.2   Chloride 98 - 111 mmol/L 115  113    CO2 22 - 32 mmol/L 20  20    Calcium 8.9 - 10.3 mg/dL 9.2  9.0    Total Protein 6.5 - 8.1 g/dL 7.7     Total Bilirubin 0.3 - 1.2 mg/dL 0.5     Alkaline Phos 38 - 126 U/L 105     AST 15 - 41 U/L 16     ALT 0 - 44 U/L 24      Erythropoietin levels 12.6 Magnesium 2.5  RADIOGRAPHIC STUDIES: I have personally reviewed the radiological images as listed and agreed with the findings in the report. No results found.  ASSESSMENT & PLAN:   36 year old male with history of hypokalemic periodic paralysis, chronic kidney disease, anxiety/depression/bipolar 1 disorder with  #1 Polycythemia He has had this on and off since 2019. No associated thrombocytosis or persistent leukocytosis or hepatosplenomegaly. Secondary factors involved  -heavy smoking - possible primary lung disease/COPD. -Possibility of sleep apnea.  Patient notes he does not sleep much at all. -Inadequate p.o. fluid intake.  Labs today show appointment creatinine to 2.  Likely element of hemoconcentration due to poor p.o. fluid intake.  Especially given rapid shifts in hematocrits suggesting significant volume changes. Plan -All the patient's available lab results were discussed in detail with him. -His hemoglobin and hematocrit today are significantly elevated with a hematocrit of 55.4 -Patient does endorse limited p.o. fluid intake and was recommended to drink at least 2 L of water daily. -He is seeing [facility] for his chronic kidney disease and also management of his electrolyte issues given complex presentation with hypokalemic  periodic paralysis needing  high-dose potassium replacement and also on sodium bicarbonate. -We discussed need for and importance of smoking cessation since this would contribute to secondary polycythemia. -Would recommend follow-up with PCP for consideration of sleep study to rule out sleep apnea as a secondary etiology for polycythemia as well. -We discussed that in the absence of hepatosplenomegaly associated thrombocytosis or significant leukocytosis, variable nature of his polycythemia including completely normal labs in between and his age the likelihood of this being polycythemia vera is low. -However given significant intermittent levels of his hematocrit we will send clonal mutation testing including JAK2, MPL and CAL reticulin to rule out the possibility of a clonal erythrocytosis. -His erythropoietin levels are also within normal limits at 12.6 which are not suppressed as we would expect with polycythemia vera which also makes polycythemia vera less likely. -We discussed that the treatment goals would be different if this were polycythemia vera versus secondary polycythemia. -With polycythemia vera we would typically have to consider therapeutic phlebotomies to maintain his hematocrit closer to 45 and consider starting him on aspirin. -With secondary polycythemia there is not a clear goal for therapeutic phlebotomies and the treatment is to address the underlying secondary issues including his cigarette smoking, possible lung disease, possible sleep apnea and dehydration causing hemoconcentration.  Orders Placed This Encounter  Procedures   CBC with Differential (Cancer Center Only)    Standing Status:   Future    Number of Occurrences:   1    Standing Expiration Date:   05/11/2023   CMP (Cancer Center only)    Standing Status:   Future    Number of Occurrences:   1    Standing Expiration Date:   05/11/2023   Magnesium    Standing Status:   Future    Number of Occurrences:   1    Standing Expiration Date:    05/10/2023   JAK2 (including V617F and Exon 12), MPL, and CALR-Next Generation Sequencing    Standing Status:   Future    Number of Occurrences:   1    Standing Expiration Date:   05/11/2023   Erythropoietin    Standing Status:   Future    Number of Occurrences:   1    Standing Expiration Date:   05/10/2023    Follow-up  Labs today Phone visit with Dr Candise Che in 2 weeks   The total time spent in the appointment was 60 minutes*.  All of the patient's questions were answered with apparent satisfaction. The patient knows to call the clinic with any problems, questions or concerns.   Wyvonnia Lora MD MS AAHIVMS [facility] Hematology/Oncology Physician [facility]  .*Total Encounter Time as defined by the Centers for Medicare and Medicaid Services includes, in addition to the face-to-face time of a patient visit (documented in the note above) non-face-to-face time: obtaining and reviewing outside history, ordering and reviewing medications, tests or procedures, care coordination (communications with other health care professionals or caregivers) and documentation in the medical record.   05/10/2022 3:16 PM

## 2022-05-11 LAB — ERYTHROPOIETIN: Erythropoietin: 12.6 m[IU]/mL (ref 2.6–18.5)

## 2022-05-21 ENCOUNTER — Telehealth: Payer: Self-pay

## 2022-05-21 ENCOUNTER — Other Ambulatory Visit (HOSPITAL_COMMUNITY): Payer: Self-pay

## 2022-05-21 ENCOUNTER — Ambulatory Visit (INDEPENDENT_AMBULATORY_CARE_PROVIDER_SITE_OTHER): Payer: Medicaid Other

## 2022-05-21 ENCOUNTER — Encounter: Payer: Self-pay | Admitting: Family Medicine

## 2022-05-21 ENCOUNTER — Ambulatory Visit: Payer: Medicaid Other | Admitting: Family Medicine

## 2022-05-21 VITALS — BP 128/84 | HR 77 | Temp 97.5°F | Wt 235.2 lb

## 2022-05-21 DIAGNOSIS — L0291 Cutaneous abscess, unspecified: Secondary | ICD-10-CM

## 2022-05-21 DIAGNOSIS — M545 Low back pain, unspecified: Secondary | ICD-10-CM

## 2022-05-21 DIAGNOSIS — G8929 Other chronic pain: Secondary | ICD-10-CM

## 2022-05-21 MED ORDER — METHOCARBAMOL 750 MG PO TABS: 750.0000 mg | ORAL_TABLET | Freq: Four times a day (QID) | ORAL | 0 refills | Status: AC

## 2022-05-21 MED ORDER — LIDOCAINE 5 % EX PTCH: 1.0000 | MEDICATED_PATCH | CUTANEOUS | 0 refills | Status: DC

## 2022-05-21 MED ORDER — CEPHALEXIN 500 MG PO CAPS: 500.0000 mg | ORAL_CAPSULE | Freq: Four times a day (QID) | ORAL | 0 refills | Status: AC

## 2022-05-21 NOTE — Patient Instructions (Addendum)
For back pain, use robaxin and lidocaine patch. We are referring to orthopedics and getting an xray.  For abscess, take keflex as prescribed. You may use tylenol for pain.  Come back if in a few days if no improvement, go to ED if pain severe or developing fevers.

## 2022-05-21 NOTE — Progress Notes (Unsigned)
Initial visit for chronic lower back pain x "20 Years" Pt denies any recent injury.
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
## 2022-05-21 NOTE — Telephone Encounter (Signed)
  Received a fax from [facility] regarding Prior Authorization for Lidocaine 5% patches.   Authorization has been DENIED due to The requested drug is not approved by the Food and Drug Administration (FDA) for the treatment of Low  back pain, unspecified in members 35 years of age or older. It is FDA approved for post-herpetic neuralgia in  members 16 years of age or older.  PA Case ID: [id number]   DENIAL LETTER WILL BE UPLOAD TO MEDIA

## 2022-05-21 NOTE — Telephone Encounter (Signed)
Pharmacy Patient Advocate Encounter   Received notification from [facility] that prior authorization for Lidocaine 5% patches is required/requested.   PA submitted on 05/21/22 to [facility] Medicaid [place] via CoverMyMeds  Key  [facility] PA Case ID: [id number]   Status is pending

## 2022-05-21 NOTE — Assessment & Plan Note (Signed)
Chronic lower back pain, sustained from an injury about 20 years ago Cannot use NSAIDs due to chronic kidney disease, reports PT in the past has not improved symptoms We will get lumbar films Try Robaxin and lidocaine patch Referral to orthopedics

## 2022-05-27 ENCOUNTER — Inpatient Hospital Stay (HOSPITAL_BASED_OUTPATIENT_CLINIC_OR_DEPARTMENT_OTHER): Payer: Medicaid Other | Admitting: Hematology

## 2022-05-27 DIAGNOSIS — F17210 Nicotine dependence, cigarettes, uncomplicated: Secondary | ICD-10-CM | POA: Diagnosis not present

## 2022-05-27 DIAGNOSIS — D751 Secondary polycythemia: Secondary | ICD-10-CM | POA: Diagnosis not present

## 2022-05-27 DIAGNOSIS — N183 Chronic kidney disease, stage 3 unspecified: Secondary | ICD-10-CM | POA: Diagnosis not present

## 2022-05-27 LAB — JAK2 (INCLUDING V617F AND EXON 12), MPL,& CALR-NEXT GEN SEQ

## 2022-05-27 NOTE — Progress Notes (Signed)
Marland Kitchen   HEMATOLOGY/ONCOLOGY PHONE VISIT NOTE  Date of Service: 05/27/2022  Patient Care Team: Garnette Gunner, MD as PCP - General (Family Medicine)  CHIEF COMPLAINTS/PURPOSE OF CONSULTATION:  Polycythemia  HISTORY OF PRESENTING ILLNESS:   Adam Weaver is a wonderful 36 y.o. male who has been referred to [place] by Dr.Thompson, Ebbie Ridge, MD for evaluation and management of polycythemia.  Patient has a history of hypokalemic periodic paralysis, renal stones, chronic kidney disease stage III, depression, anxiety who was last seen by his primary care physician on 04/19/2022 and on labs noted to have an elevated hemoglobin of 17.8 with a hematocrit of 54.1 with a normal WBC count of 7.5k and normal platelets of 240k. He was referred to [place] for further evaluation of his polycythemia. In reviewing his available labs from 2019 through recently he has had hematocrit that fluctuated between 41.7 up to 55.  He has had normal hematocrits in between significantly elevated hematocrits and does not show a clear progressive increase in his hemoglobin/hematocrit in a linear fashion. Recently he has had a hematocrit of 52 on 04/13/2022 followed by hematocrit of 45.4 on 04/14/2022 and a hematocrit of 54.1 on 04/19/2022. He notes that he does not drink much water. He has been on high-dose potassium and bicarbonate replacement for his hypokalemic periodic paralysis. Patient notes no family history of known genetic blood disorders. No headaches.  No previous history of venous thromboembolism or other arterial blood clots. No use of testosterone or other testosterone supplements. He has been a regular smoker and has smoked as much as 3 packs/day in the past but has been cutting down to about a pack and a half a day currently. He notes he consumes about 1 can of beer a week and has not had more significant alcohol use. He reports that he has had recurrent kidney stones and that has been quite a problem and has seen  several urologists in the past.  He notes that he has been referred to [facility] for his stage III chronic kidney disease.  No recent abdominal pain or distention.  No shortness of breath or chest pain.  No change in vision.  No new headaches.  Interval History:  Adam Weaver is a wonderful 36 y.o. male who is here for evaluation and management of polycythemia. He was seen by me on 05/10/2022 for initial consultation. .I connected with [name] on 05/27/2022 at  3:30 PM EST by telephone visit and verified that I am speaking with the correct person using two identifiers.   Patient was last seen by me on 05/10/2022 and was doing well overall.   Patient notes he is doing well without any new medical concerns since our last visit.   He reports that he had cough during his last visit with me on 05/10/2022, which he thought was due to allergies. However, he notes that he was diagnosed with Bronchitis after our visit.   He notes that his potassium was low because he did not have the proper potassium medication dosage.   Patient reports he has cut down on smoking since our last visit. He is smoking around 3 cigarettes a day.   I discussed the limitations, risks, security and privacy concerns of performing an evaluation and management service by telemedicine and the availability of in-person appointments. I also discussed with the patient that there may be a patient responsible charge related to this service. The patient expressed understanding and agreed to proceed.   Other persons participating  in the visit and their role in the encounter: None   Patient's location: Home  Provider's location: [facility]   Chief Complaint: Polycythemia     MEDICAL HISTORY:  Past Medical History:  Diagnosis Date   Anxiety 2018   Depression 2016   History of kidney stones    Hypokalemia    Hypokalemic periodic paralysis    POTASSIUM GETS LOW TROUBLE MOVING DAY AFTER SURGERY   Kidney stones     Renal disorder    STAGE 3 CKD, NO NEPHROLOGIST    SURGICAL HISTORY: Past Surgical History:  Procedure Laterality Date   CYSTOSCOPY W/ URETERAL STENT PLACEMENT Right 06/21/2019   Procedure: CYSTOSCOPY WITH RIGHT URETERAL STENT PLACEMENT;  Surgeon: Noel Christmas, MD;  Location: [facility];  Service: Urology;  Laterality: Right;   CYSTOSCOPY WITH RETROGRADE PYELOGRAM, URETEROSCOPY AND STENT PLACEMENT Right 07/20/2019   Procedure: CYSTOSCOPY WITH RETROGRADE PYELOGRAM, URETEROSCOPY AND STENT PLACEMENT;  Surgeon: Noel Christmas, MD;  Location: [facility];  Service: Urology;  Laterality: Right;  90 MINS   HOLMIUM LASER APPLICATION Right 07/20/2019   Procedure: HOLMIUM LASER APPLICATION;  Surgeon: Noel Christmas, MD;  Location: [facility];  Service: Urology;  Laterality: Right;   HOLMIUM LASER APPLICATION Right 10/26/2019   Procedure: HOLMIUM LASER APPLICATION;  Surgeon: Noel Christmas, MD;  Location: [facility];  Service: Urology;  Laterality: Right;   IR URETERAL STENT RIGHT NEW ACCESS W/O SEP NEPHROSTOMY CATH  10/26/2019   LITHOTRIPSY     NEPHROLITHOTOMY Right 10/26/2019   Procedure: NEPHROLITHOTOMY PERCUTANEOUS;  Surgeon: Noel Christmas, MD;  Location: [facility];  Service: Urology;  Laterality: Right;  3 HRS   PINS IN RIGHT ARM     LATER REMOVED    SOCIAL HISTORY: Social History   Socioeconomic History   Marital status: Significant Other    Spouse name: Not on file   Number of children: Not on file   Years of education: Not on file   Highest education level: Not on file  Occupational History   Not on file  Tobacco Use   Smoking status: Every Day    Packs/day: 1.00    Years: 3.00    Total pack years: 3.00    Types: Cigarettes, E-cigarettes    Passive exposure: Never   Smokeless tobacco: Never  Vaping Use   Vaping Use: Former  Substance and Sexual Activity   Alcohol use: Yes    Alcohol/week: 1.0 standard drink of alcohol    Types: 1 Cans  of beer per week    Comment: RARE   Drug use: Never   Sexual activity: Yes    Birth control/protection: None  Other Topics Concern   Not on file  Social History Narrative   Not on file   Social Determinants of Health   Financial Resource Strain: Not on file  Food Insecurity: Food Insecurity Present (04/13/2022)   Hunger Vital Sign    Worried About Running Out of Food in the Last Year: Sometimes true    Ran Out of Food in the Last Year: Never true  Transportation Needs: No Transportation Needs (04/13/2022)   PRAPARE - Administrator, Civil Service (Medical): No    Lack of Transportation (Non-Medical): No  Physical Activity: Not on file  Stress: Not on file  Social Connections: Not on file  Intimate Partner Violence: Not At Risk (04/13/2022)   Humiliation, Afraid, Rape, and Kick questionnaire    Fear of Current or Ex-Partner:  No    Emotionally Abused: No    Physically Abused: No    Sexually Abused: No    FAMILY HISTORY: Family History  Problem Relation Age of Onset   Diabetes Mother    Heart disease Mother    Hypertension Mother    COPD Father    Arthritis Maternal Grandfather    Arthritis Maternal Grandmother    Cancer Maternal Aunt    Kidney disease Maternal Aunt     ALLERGIES:  is allergic to ciprofloxacin and zithromax [azithromycin].  MEDICATIONS:  Current Outpatient Medications  Medication Sig Dispense Refill   Aspirin-Caffeine (BC FAST PAIN RELIEF) 845-65 MG PACK Take 1 packet by mouth daily as needed (headache/back pain.).     cephALEXin (KEFLEX) 500 MG capsule Take 1 capsule (500 mg total) by mouth 4 (four) times daily for 7 days. 28 capsule 0   hydrocortisone (ANUSOL-HC) 25 MG suppository Place 1 suppository (25 mg total) rectally 2 (two) times daily. (Patient not taking: Reported on 05/10/2022) 12 suppository 0   lidocaine (LIDODERM) 5 % Place 1 patch onto the skin daily. Remove & Discard patch within 12 hours or as directed by MD 30 patch 0    methocarbamol (ROBAXIN-750) 750 MG tablet Take 1 tablet (750 mg total) by mouth 4 (four) times daily. 120 tablet 0   potassium chloride SA (KLOR-CON M) 20 MEQ tablet Take 2 tablets (40 mEq total) by mouth 2 (two) times daily. 360 tablet 1   sodium bicarbonate 650 MG tablet Take 1 tablet (650 mg total) by mouth 2 (two) times daily. 180 tablet 1   No current facility-administered medications for this visit.    REVIEW OF SYSTEMS:    10 Point review of Systems was done is negative except as noted above.  PHYSICAL EXAMINATION: Telemedicine visit  LABORATORY DATA:  I have reviewed the data as listed  .    Latest Ref Rng & Units 05/10/2022    3:50 PM 04/23/2022    9:21 AM 04/14/2022    4:59 AM  CBC  WBC 4.0 - 10.5 K/uL 12.3  7.5  10.9   Hemoglobin 13.0 - 17.0 g/dL 19.0  17.8 Repeated and verified X2.  15.7   Hematocrit 39.0 - 52.0 % 55.4  54.1  45.4   Platelets 150 - 400 K/uL 315  240.0  251     .    Latest Ref Rng & Units 05/10/2022    3:50 PM 04/23/2022    9:21 AM 04/15/2022    5:36 AM  CMP  Glucose 70 - 99 mg/dL 91  96    BUN 6 - 20 mg/dL 18  18    Creatinine 0.61 - 1.24 mg/dL 2.04  1.50    Sodium 135 - 145 mmol/L 142  141    Potassium 3.5 - 5.1 mmol/L 3.2  3.8  3.2   Chloride 98 - 111 mmol/L 115  113    CO2 22 - 32 mmol/L 20  20    Calcium 8.9 - 10.3 mg/dL 9.2  9.0    Total Protein 6.5 - 8.1 g/dL 7.7     Total Bilirubin 0.3 - 1.2 mg/dL 0.5     Alkaline Phos 38 - 126 U/L 105     AST 15 - 41 U/L 16     ALT 0 - 44 U/L 24      Erythropoietin levels 12.6 Magnesium 2.5  RADIOGRAPHIC STUDIES: I have personally reviewed the radiological images as listed and agreed with the findings  in the report. DG Lumbar Spine Complete  Result Date: 05/22/2022 CLINICAL DATA:  Chronic low back pain. EXAM: LUMBAR SPINE - COMPLETE 4+ VIEW COMPARISON:  Noncontrast CT 11/23/2019 FINDINGS: There are 5 non-rib-bearing lumbar vertebra. Alignment is normal. Vertebral body heights are normal.  Anterior spurring at L3-L4 and L4-L5 with preservation of disc spaces. No evidence of focal bone lesion or bony destruction. No fracture. Bilateral medullary nephrocalcinosis. IMPRESSION: Anterior spurring at L3-L4 and L4-L5 with preservation of disc spaces. Electronically Signed   By: Keith Rake M.D.   On: 05/22/2022 23:50      ASSESSMENT & PLAN:   37 year old male with history of hypokalemic periodic paralysis, chronic kidney disease, anxiety/depression/bipolar 1 disorder with  #1 Polycythemia He has had this on and off since 2019. No associated thrombocytosis or persistent leukocytosis or hepatosplenomegaly. Secondary factors involved  -heavy smoking - possible primary lung disease/COPD. -Possibility of sleep apnea.  Patient notes he does not sleep much at all. -Inadequate p.o. fluid intake.  Labs today show appointment creatinine to 2.  Likely element of hemoconcentration due to poor p.o. fluid intake.  Especially given rapid shifts in hematocrits suggesting significant volume changes.  PLAN: -Discussed lab results from 05/10/2022. Labs shows WBC of 12.3 K, hemoglobin of 19.0 K, and platelets of 315 K. CMP showed creatine of 2.04 and potassium of 3.2. He was dehydrated during the time of lab. -We discussed need for and importance of smoking cessation since this would contribute to secondary polycythemia. -Would recommend follow-up with PCP for consideration of sleep study to rule out sleep apnea and COPD as a secondary etiology for polycythemia as well.  -Discussed the mutation test results, which shows that he does not have JAK-2 mutation. 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   FOLLOW-UP: RTC with PCP  The total time spent in the appointment was 15 minutes* .  All of the patient's questions were answered with apparent satisfaction. The patient knows to call the clinic with any problems, questions or concerns.   Sullivan Lone  MD MS AAHIVMS [facility] Hematology/Oncology Physician [facility]  .*Total Encounter Time as defined by the Centers for Medicare and Medicaid Services includes, in addition to the face-to-face time of a patient visit (documented in the note above) non-face-to-face time: obtaining and reviewing outside history, ordering and reviewing medications, tests or procedures, care coordination (communications with other health care professionals or caregivers) and documentation in the medical record.   I, Cleda Mccreedy, am acting as a Education administrator for Sullivan Lone, MD. .I have reviewed the above documentation for accuracy and completeness, and I agree with the above. Brunetta Genera MD

## 2022-05-28 ENCOUNTER — Ambulatory Visit (INDEPENDENT_AMBULATORY_CARE_PROVIDER_SITE_OTHER): Payer: Medicaid Other | Admitting: Surgery

## 2022-05-28 ENCOUNTER — Ambulatory Visit: Payer: Self-pay

## 2022-05-28 DIAGNOSIS — M47816 Spondylosis without myelopathy or radiculopathy, lumbar region: Secondary | ICD-10-CM

## 2022-05-28 DIAGNOSIS — M25552 Pain in left hip: Secondary | ICD-10-CM

## 2022-05-28 DIAGNOSIS — M1612 Unilateral primary osteoarthritis, left hip: Secondary | ICD-10-CM | POA: Diagnosis not present

## 2022-05-28 DIAGNOSIS — M1611 Unilateral primary osteoarthritis, right hip: Secondary | ICD-10-CM

## 2022-05-28 DIAGNOSIS — M160 Bilateral primary osteoarthritis of hip: Secondary | ICD-10-CM | POA: Diagnosis not present

## 2022-05-28 DIAGNOSIS — G8929 Other chronic pain: Secondary | ICD-10-CM

## 2022-05-28 DIAGNOSIS — M25551 Pain in right hip: Secondary | ICD-10-CM | POA: Diagnosis not present

## 2022-05-28 NOTE — Progress Notes (Unsigned)
Office Visit Note   Patient: Adam Weaver           Date of Birth: [birth year]           MRN: [id number] Visit Date: 05/28/2022              Requested by: Garnette Gunner, MD [address] [place],  [place] [address] PCP: Garnette Gunner, MD   Assessment & Plan: Visit Diagnoses:  1. Chronic hip pain, bilateral   2. Unilateral primary osteoarthritis, left hip   3. Unilateral primary osteoarthritis, right hip   4. Spondylosis without myelopathy or radiculopathy, lumbar region     Plan: I reviewed lumbar spine and bilateral hip x-rays with patient today.  After speaking with patient and performing his exam I think the biggest problem that he is having is related to his bilateral hip DJD.  I would like him to see Dr. Doneen Poisson here in our office within the next week or so to discuss further conservative versus surgical treatment options.  Patient is not able to take oral NSAIDs due to his renal issues.  I advised him that hip replacements would be of great benefit to him but he is very young.  He would like to return back to being as mobile as possible.  No pain medication given today.  If medications are needed I recommend that he get that from his primary care provider.  With his renal issues I did advise against him taking BC powders.  All questions were answered.  Follow-Up Instructions: Return in about 1 week (around 06/04/2022) for WITH DR [facility] FOR SURGICAL CONSULT BILAT TOTAL HIP.   Orders:  Orders Placed This Encounter  Procedures   XR HIPS BILAT W OR W/O PELVIS 2V   No orders of the defined types were placed in this encounter.     Procedures: No procedures performed   Clinical Data: No additional findings.   Subjective: Chief Complaint  Patient presents with   Lower Back - Pain    HPI 36 year old white male who is a new patient to clinic comes in with complaints of low back pain and left greater than right hip pain.  Patient referred by  his PCP.  States that he had ongoing problems with both areas for several years.  Localizes pain in the central low back but also has severe pain in both hip joints.  Patient states that he has a brother that is 72 years old with history of ankylosing spondylitis who has had bilateral total hip replacements.  Patient denies any pain that radiates down his legs.  Both hips are painful with ambulation and rotation.  Patient has been out of work for several years due to his current complaint and also other medical issues.  Past medical history also significant for familial hypokalemic periodic paralysis.  Also has a history of chronic kidney disease.  Patient is unable to take oral NSAIDs but admits to taking BC powders against previous medical advice.  PCP ordered lumbar spine x-ray which was done May 21, 2022 and that report showed:   EXAM: LUMBAR SPINE - COMPLETE 4+ VIEW   COMPARISON:  Noncontrast CT 11/23/2019   FINDINGS: There are 5 non-rib-bearing lumbar vertebra. Alignment is normal. Vertebral body heights are normal. Anterior spurring at L3-L4 and L4-L5 with preservation of disc spaces. No evidence of focal bone lesion or bony destruction. No fracture. Bilateral medullary nephrocalcinosis.   IMPRESSION: Anterior spurring at L3-L4 and L4-L5 with preservation  of disc spaces.     Electronically Signed   By: Keith Rake M.D.   On: 05/22/2022 23:50 Review of Systems No current complaints of cardiac pulmonary GI issues.  Objective: Vital Signs: There were no vitals taken for this visit.  Physical Exam Constitutional:      Appearance: Normal appearance.  HENT:     Head: Normocephalic.     Nose: Nose normal.  Eyes:     Extraocular Movements: Extraocular movements intact.  Musculoskeletal:     Comments: Gait is antalgic.  No lumbar paraspinal tenderness/spasm.  Some tenderness over the bilateral hip greater trochanter bursa.  Patient has marked bilateral hip groin pain  with about 5 to 10 degrees internal/external rotation.  Negative straight leg raise.  No focal motor deficits.  Neurological:     Mental Status: He is alert and oriented to person, place, and time.  Psychiatric:        Mood and Affect: Mood normal.     Ortho Exam  Specialty Comments:  No specialty comments available.  Imaging: No results found.   PMFS History: Patient Active Problem List   Diagnosis Date Noted   Chronic bilateral low back pain 05/21/2022   Hemorrhoids 04/30/2022   Hypokalemia due to excessive renal loss of potassium 04/14/2022   Nephrolithiasis 10/26/2019   Acute kidney injury superimposed on CKD ([place]) 06/22/2019   Acute lower UTI 06/22/2019   Right nephrolithiasis 06/22/2019   Obesity (BMI 30-39.9) 06/22/2019   Erythrocytosis 06/22/2019   Hyperglycemia 06/22/2019   Bronchitis    Hypokalemic periodic paralysis 03/16/2018   Familial hypokalemic periodic paralysis 03/16/2018   Hypokalemia 12/05/2017   ARF (acute renal failure) ([place]) 12/05/2017   Leucocytosis 12/05/2017   Dehydration 12/05/2017   Past Medical History:  Diagnosis Date   Anxiety 2018   Depression 2016   History of kidney stones    Hypokalemia    Hypokalemic periodic paralysis    POTASSIUM GETS LOW TROUBLE MOVING DAY AFTER SURGERY   Kidney stones    Renal disorder    STAGE 3 CKD, NO NEPHROLOGIST    Family History  Problem Relation Age of Onset   Diabetes Mother    Heart disease Mother    Hypertension Mother    COPD Father    Arthritis Maternal Grandfather    Arthritis Maternal Grandmother    Cancer Maternal Aunt    Kidney disease Maternal Aunt     Past Surgical History:  Procedure Laterality Date   CYSTOSCOPY W/ URETERAL STENT PLACEMENT Right 06/21/2019   Procedure: CYSTOSCOPY WITH RIGHT URETERAL STENT PLACEMENT;  Surgeon: Robley Fries, MD;  Location: [facility];  Service: Urology;  Laterality: Right;   CYSTOSCOPY WITH RETROGRADE PYELOGRAM, URETEROSCOPY AND STENT PLACEMENT  Right 07/20/2019   Procedure: CYSTOSCOPY WITH RETROGRADE PYELOGRAM, URETEROSCOPY AND STENT PLACEMENT;  Surgeon: Robley Fries, MD;  Location[facility];  Service: Urology;  Laterality: Right;  90 MINS   HOLMIUM LASER APPLICATION Right 0000000   Procedure: HOLMIUM LASER APPLICATION;  Surgeon: Robley Fries, MD;  Location: [facility];  Service: Urology;  Laterality: Right;   HOLMIUM LASER APPLICATION Right [id number]   Procedure: HOLMIUM LASER APPLICATION;  Surgeon: Robley Fries, MD;  Location: [facility];  Service: Urology;  Laterality: Right;   IR URETERAL STENT RIGHT NEW ACCESS W/O SEP NEPHROSTOMY CATH  10/26/2019   LITHOTRIPSY     NEPHROLITHOTOMY Right 10/26/2019   Procedure: NEPHROLITHOTOMY PERCUTANEOUS;  Surgeon: Robley Fries, MD;  Location:  [facility];  Service: Urology;  Laterality: Right;  3 HRS   PINS IN RIGHT ARM     LATER REMOVED   Social History   Occupational History   Not on file  Tobacco Use   Smoking status: Every Day    Packs/day: 1.00    Years: 3.00    Total pack years: 3.00    Types: Cigarettes, E-cigarettes    Passive exposure: Never   Smokeless tobacco: Never  Vaping Use   Vaping Use: Former  Substance and Sexual Activity   Alcohol use: Yes    Alcohol/week: 1.0 standard drink of alcohol    Types: 1 Cans of beer per week    Comment: RARE   Drug use: Never   Sexual activity: Yes    Birth control/protection: None

## 2022-05-30 ENCOUNTER — Encounter: Payer: Self-pay | Admitting: Orthopaedic Surgery

## 2022-05-30 ENCOUNTER — Ambulatory Visit (INDEPENDENT_AMBULATORY_CARE_PROVIDER_SITE_OTHER): Payer: Medicaid Other | Admitting: Orthopaedic Surgery

## 2022-05-30 VITALS — Ht 70.0 in | Wt 236.0 lb

## 2022-05-30 DIAGNOSIS — M25551 Pain in right hip: Secondary | ICD-10-CM | POA: Diagnosis not present

## 2022-05-30 DIAGNOSIS — G8929 Other chronic pain: Secondary | ICD-10-CM

## 2022-05-30 DIAGNOSIS — M25552 Pain in left hip: Secondary | ICD-10-CM | POA: Diagnosis not present

## 2022-05-30 NOTE — Progress Notes (Signed)
The patient is someone seeing for the first time but he is referred from Lestine Mount, PA-C to evaluate and treat bilateral hip pain has been hurting for over 20 years now.  He has a history of arthritis in his hips and there is a family history of ankylosing spondylitis and his half-brother.  He does have chronic low back pain as well.  He does work hard he is only 36 years old.  He does report groin pain on both sides and it can be quite severe at times.  Hips pop quite a bit and if he is on his feet for long period time both hips hurt and they hurt in the groin as well.  He is significantly stiff with internal and external rotation of both hips with severe pain in the groin with both hips.  An AP pelvis and lateral both hips were reviewed and he does have an overhanging osteophyte is quite significant on both hips at the superior lateral edge of the acetabulum.  The hip femoral heads are nice and concentric but there is some joint space narrowing and osteophytes.  Believe it is essential we obtain an MRI of both hips to assess for avascular necrosis as well as to look at the cartilage and subchondral bone so we can make the best determination of whether or not hip replacement would be the next step for him.  He has tried and failed all forms conservative treatment up until this point as well for well over 20 years.  We will see him back once we have the MRI of both hips.

## 2022-05-31 NOTE — Addendum Note (Signed)
Addended by: Barbette Or on: 05/31/2022 02:14 PM   Modules accepted: Orders

## 2022-06-05 ENCOUNTER — Ambulatory Visit (INDEPENDENT_AMBULATORY_CARE_PROVIDER_SITE_OTHER): Payer: Medicaid Other | Admitting: Student

## 2022-06-05 VITALS — BP 110/71 | Ht 70.0 in

## 2022-06-05 DIAGNOSIS — F332 Major depressive disorder, recurrent severe without psychotic features: Secondary | ICD-10-CM

## 2022-06-05 DIAGNOSIS — F17200 Nicotine dependence, unspecified, uncomplicated: Secondary | ICD-10-CM | POA: Diagnosis not present

## 2022-06-05 MED ORDER — ESCITALOPRAM OXALATE 10 MG PO TABS: 10.0000 mg | ORAL_TABLET | Freq: Every day | ORAL | 0 refills | Status: DC
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
## 2022-06-08 ENCOUNTER — Encounter (HOSPITAL_COMMUNITY): Payer: Self-pay | Admitting: Student

## 2022-06-08 DIAGNOSIS — F17200 Nicotine dependence, unspecified, uncomplicated: Secondary | ICD-10-CM | POA: Insufficient documentation

## 2022-06-08 DIAGNOSIS — F339 Major depressive disorder, recurrent, unspecified: Secondary | ICD-10-CM

## 2022-06-08 HISTORY — DX: Nicotine dependence, unspecified, uncomplicated: F17.200

## 2022-06-08 HISTORY — DX: Major depressive disorder, recurrent, unspecified: F33.9

## 2022-06-08 NOTE — Assessment & Plan Note (Signed)
Contemplative stage. Smokes about 1/2 PPD.  Encouraged cessation Plan to revisit smoking cessation

## 2022-06-08 NOTE — Assessment & Plan Note (Signed)
Patient experiences 9/9 sxs of MDD, atypical type. Plan to re-start lexapro given efficacy in the past and patient preference. His latest GFR >40 at 44. Will continue to monitor. PCP following.  STARTED Lexapro 10 mg daily Recommended decreasing caffeine intake and limiting it to before noon. As well as practicing sleep hygiene.

## 2022-06-18 ENCOUNTER — Ambulatory Visit: Payer: Medicaid Other | Admitting: Surgery

## 2022-06-28 ENCOUNTER — Other Ambulatory Visit: Payer: Medicaid Other

## 2022-06-28 ENCOUNTER — Inpatient Hospital Stay: Admission: RE | Admit: 2022-06-28 | Payer: Medicaid Other | Source: Ambulatory Visit

## 2022-07-09 ENCOUNTER — Ambulatory Visit: Payer: Medicaid Other | Admitting: Orthopaedic Surgery

## 2022-07-10 ENCOUNTER — Encounter (HOSPITAL_COMMUNITY): Payer: Self-pay | Admitting: Student

## 2022-07-10 ENCOUNTER — Ambulatory Visit (INDEPENDENT_AMBULATORY_CARE_PROVIDER_SITE_OTHER): Payer: Medicare Other | Admitting: Student

## 2022-07-10 ENCOUNTER — Ambulatory Visit (HOSPITAL_COMMUNITY): Payer: Medicare Other | Admitting: Licensed Clinical Social Worker

## 2022-07-10 ENCOUNTER — Encounter (HOSPITAL_COMMUNITY): Payer: Self-pay

## 2022-07-10 ENCOUNTER — Other Ambulatory Visit (HOSPITAL_COMMUNITY): Payer: Self-pay | Admitting: Psychiatry

## 2022-07-10 VITALS — BP 109/83 | HR 71 | Ht 71.0 in | Wt 238.2 lb

## 2022-07-10 DIAGNOSIS — F411 Generalized anxiety disorder: Secondary | ICD-10-CM | POA: Insufficient documentation

## 2022-07-10 DIAGNOSIS — F17200 Nicotine dependence, unspecified, uncomplicated: Secondary | ICD-10-CM

## 2022-07-10 DIAGNOSIS — F332 Major depressive disorder, recurrent severe without psychotic features: Secondary | ICD-10-CM

## 2022-07-10 DIAGNOSIS — N1831 Chronic kidney disease, stage 3a: Secondary | ICD-10-CM | POA: Diagnosis not present

## 2022-07-10 MED ORDER — SODIUM BICARBONATE 650 MG PO TABS: 650.0000 mg | ORAL_TABLET | Freq: Two times a day (BID) | ORAL | 1 refills | Status: AC

## 2022-07-10 MED ORDER — POTASSIUM CHLORIDE CRYS ER 20 MEQ PO TBCR: 40.0000 meq | EXTENDED_RELEASE_TABLET | Freq: Two times a day (BID) | ORAL | 1 refills | Status: AC

## 2022-07-10 MED ORDER — ESCITALOPRAM OXALATE 10 MG PO TABS: 10.0000 mg | ORAL_TABLET | Freq: Every day | ORAL | 0 refills | Status: DC

## 2022-07-10 NOTE — Assessment & Plan Note (Signed)
Continue home rx. Defer to PCP.

## 2022-07-10 NOTE — Progress Notes (Signed)
[facility] Progress Note  Date: 07/10/2022 3:06 PM Name: Adam Weaver Age: 37 y.o.  DOB: 1985/10/13  MRN: [id number]  Chief Complaint:  Chief Complaint  Patient presents with   Depression   Follow-up    Visit Diagnosis:    ICD-10-CM   1. Severe episode of recurrent major depressive disorder, without psychotic features ([place])  A63.0 BASIC METABOLIC PANEL WITH GFR    escitalopram (LEXAPRO) 10 MG tablet    2. Stage 3a chronic kidney disease (HCC)  Z60.10 BASIC METABOLIC PANEL WITH GFR    potassium chloride SA (KLOR-CON M) 20 MEQ tablet    sodium bicarbonate 650 MG tablet    3. Tobacco use disorder  F17.200       HPI: Adam Weaver is a 37 y.o. male with PMH of MDD, GAD, ADHD, OCD, PTSD, sociopathic personality d/o, tobacco use d/o, remote suicide attempt (2x), no inpatient psych admission, CKD3a, polycythemia, familial hypokalemic periodic paralysis, who presented unaccompanied as a follow-up (initial visit 06/05/2022) for med management of "anxiety and anger".  Rx prior to encounter:  Potassium 80 mEq, sodium bicarb 650 mg BID, STARTED Lexapro 10 mg daily   Patient reported improved depression and anxiety after starting lexapro. Reported that there is some anger but this is also much improved. Also patient was able to obtain disabilities and move his family into a more stable housing situation in an apartment. If patient's depression was 10/10 (very depressed) during last encounter, patient reported ranging between 2-4/10 (1/10 not depressed). However patient reported significant sexual side effects that bothers him and his wife. Patient understood that given his kidney function on his last lab draw, that medication changes will be held until [facility] drawn in clinic has resulted for his wellbeing. For patient's sexual side effects, discussed trialing remeron if kidney function permits. Discussed the risk, benefits, and side effects and patient was  amenable to trial.   For sleep, patient reported still having an erratic sleep schedule due to children and sleep hygiene. However patient has been decreasing caffeine intake and tobacco smoking which has improved the quality of his sleep some.  Reported having adequate energy throughout the day.  Barrier to cutting back on caffeine and tobacco are withdrawal headaches.  Currently patient is smoking 6 cigarettes daily from 1/2 PPD. He declined medication for assistance with cessation, as he is confident in his ability quit.  Other wise patient had no other questions or concerns and was amenable to plan per below.  Safety: Patient denied SI/HI/AVH, paranoia. Patient contracted to safety, stated that he would call fianc. Patient was reminded of of 988, 911, and [place] as well.    Review of Systems  Respiratory:  Negative for shortness of breath.   Cardiovascular:  Negative for chest pain.  Gastrointestinal:  Negative for nausea and vomiting.  Neurological:  Negative for dizziness and headaches.     Past Psychiatric History: See initial H&P (06/05/2022)  Family Psychiatric History: See initial H&P (06/05/2022)  Past Medical History:  Past Medical History:  Diagnosis Date   Acute renal failure ([place]) 12/05/2017   Anxiety 2018   Depression 2016   History of kidney stones    Hypokalemia    Hypokalemic periodic paralysis    POTASSIUM GETS LOW TROUBLE MOVING DAY AFTER SURGERY   Kidney stone 06/22/2019   Kidney stones    MDD (major depressive disorder), recurrent episode ([place]) 06/08/2022   Renal disorder    STAGE 3 CKD, NO NEPHROLOGIST  Tobacco use disorder 06/08/2022    Past Surgical History:  Procedure Laterality Date   CYSTOSCOPY W/ URETERAL STENT PLACEMENT Right 06/21/2019   Procedure: CYSTOSCOPY WITH RIGHT URETERAL STENT PLACEMENT;  Surgeon: Noel Christmas, MD;  Location: [facility];  Service: Urology;  Laterality: Right;   CYSTOSCOPY WITH RETROGRADE PYELOGRAM, URETEROSCOPY AND STENT  PLACEMENT Right 07/20/2019   Procedure: CYSTOSCOPY WITH RETROGRADE PYELOGRAM, URETEROSCOPY AND STENT PLACEMENT;  Surgeon: Noel Christmas, MD;  Location: [facility];  Service: Urology;  Laterality: Right;  90 MINS   HOLMIUM LASER APPLICATION Right 07/20/2019   Procedure: HOLMIUM LASER APPLICATION;  Surgeon: Noel Christmas, MD;  Location: [facility];  Service: Urology;  Laterality: Right;   HOLMIUM LASER APPLICATION Right 10/26/2019   Procedure: HOLMIUM LASER APPLICATION;  Surgeon: Noel Christmas, MD;  Location: [facility];  Service: Urology;  Laterality: Right;   IR URETERAL STENT RIGHT NEW ACCESS W/O SEP NEPHROSTOMY CATH  10/26/2019   LITHOTRIPSY     NEPHROLITHOTOMY Right 10/26/2019   Procedure: NEPHROLITHOTOMY PERCUTANEOUS;  Surgeon: Noel Christmas, MD;  Location: [facility];  Service: Urology;  Laterality: Right;  3 HRS   PINS IN RIGHT ARM     LATER REMOVED   Family History:  Family History  Problem Relation Age of Onset   Diabetes Mother    Heart disease Mother    Hypertension Mother    COPD Father    Arthritis Maternal Grandfather    Arthritis Maternal Grandmother    Cancer Maternal Aunt    Kidney disease Maternal Aunt    Social History:  Social History   Socioeconomic History   Marital status: Significant Other    Spouse name: Not on file   Number of children: Not on file   Years of education: Not on file   Highest education level: Not on file  Occupational History   Not on file  Tobacco Use   Smoking status: Every Day    Packs/day: 1.00    Years: 3.00    Total pack years: 3.00    Types: Cigarettes, E-cigarettes    Passive exposure: Never   Smokeless tobacco: Never  Vaping Use   Vaping Use: Former  Substance and Sexual Activity   Alcohol use: Yes    Alcohol/week: 1.0 standard drink of alcohol    Types: 1 Cans of beer per week    Comment: RARE   Drug use: Never   Sexual activity: Yes    Birth control/protection: None  Other  Topics Concern   Not on file  Social History Narrative   Not on file   Social Determinants of Health   Financial Resource Strain: Not on file  Food Insecurity: Food Insecurity Present (04/13/2022)   Hunger Vital Sign    Worried About Running Out of Food in the Last Year: Sometimes true    Ran Out of Food in the Last Year: Never true  Transportation Needs: No Transportation Needs (04/13/2022)   PRAPARE - Administrator, Civil Service (Medical): No    Lack of Transportation (Non-Medical): No  Physical Activity: Not on file  Stress: Not on file  Social Connections: Not on file   Allergies:  Allergies  Allergen Reactions   Ciprofloxacin Anaphylaxis   Zithromax [Azithromycin] Rash   Metabolic Disorder Labs: Lab Results  Component Value Date   HGBA1C 4.7 (L) 06/23/2019   MPG 88.19 06/23/2019   No results found for: "PROLACTIN" No results found for: "CHOL", "TRIG", "  HDL", "CHOLHDL", "VLDL", "LDLCALC" Lab Results  Component Value Date   TSH 2.004 04/14/2022   TSH 0.791 06/22/2019   Therapeutic Level Labs: No results found for: "LITHIUM" No results found for: "VALPROATE" No results found for: "CBMZ" Current Medications: Current Outpatient Medications  Medication Sig Dispense Refill   Aspirin-Caffeine (BC FAST PAIN RELIEF) 845-65 MG PACK Take 1 packet by mouth daily as needed (headache/back pain.).     escitalopram (LEXAPRO) 10 MG tablet Take 1 tablet (10 mg total) by mouth daily. 30 tablet 0   potassium chloride SA (KLOR-CON M) 20 MEQ tablet Take 2 tablets (40 mEq total) by mouth 2 (two) times daily. 360 tablet 1   sodium bicarbonate 650 MG tablet Take 1 tablet (650 mg total) by mouth 2 (two) times daily. 180 tablet 1   No current facility-administered medications for this visit.   Musculoskeletal: Strength & Muscle Tone: within normal limits Gait & Station: normal Patient leans: N/A   Psychiatric Specialty Exam: Blood pressure 109/83, pulse 71, height 5'  11" (1.803 m), weight 238 lb 3.2 oz (108 kg), SpO2 98 %.Body mass index is 33.22 kg/m.  General Appearance: Casual and Fairly Groomed  Eye Contact:  Good  Speech:  Clear and Coherent and Normal Rate  Volume:  Normal  Mood:  Euthymic  Affect:  Appropriate, Congruent, and Full Range  Thought Process:  Coherent, Goal Directed, and Descriptions of Associations: Circumstantial  Orientation:  Full (Time, Place, and Person)  Thought Content: WDL and Logical   Suicidal Thoughts:  No  Homicidal Thoughts:  No  Memory:  Immediate;   Good Recent;   Good  Judgement:  Fair  Insight:  Fair  Psychomotor Activity:  Normal  Concentration:  Concentration: Fair and Attention Span: Fair  Recall:  Good  Fund of Knowledge: Good  Language: Good  Akathisia:  NA  Handed:  Right  AIMS (if indicated): NA  Assets:  Communication Skills Desire for Improvement Financial Resources/Insurance Housing Intimacy Leisure Time Resilience Social Support Talents/Skills Vocational/Educational  ADL's:  Intact  Cognition: WNL  Sleep:  Fair   Screenings: GAD-7    Flowsheet Row Office Visit from 04/23/2022 in [facility]  Total GAD-7 Score 20      PHQ2-9    Flowsheet Row Clinical Support from 07/10/2022 in [facility] Office Visit from 06/05/2022 in [facility] Office Visit from 04/23/2022 in [facility]  PHQ-2 Total Score 0 3 5  PHQ-9 Total Score -- 16 23      [place] Office Visit from 06/05/2022 in [facility] ED to Hosp-Admission (Discharged) from 04/13/2022 in [place] Error: Q3, 4, or 5 should not be populated when Q2 is No No Risk       Assessment and Plan:  Keni Elison is a 37 y.o. male MDD, GAD, ADHD, OCD, PTSD, sociopathic personality d/o, tobacco use d/o, remote suicide attempt (2x), no  inpatient psych admission, CKD3a, polycythemia, familial hypokalemic periodic paralysis, who presented unaccompanied as a follow-up (initial visit 06/05/2022) for med management of "anxiety and anger".  Lois was seen today for depression and follow-up.  Severe episode of recurrent major depressive disorder, without psychotic features [facility]) Assessment & Plan: Patient mood has improved after starting lexapro however, he is experiencing sexual side effects. Will continue lexapro at same dose for now until BMP drawn (07/10/2022) has resulted. If GFR >40 will  proceed to switch patient to remeron and dc lexapro which should help with the sexual side effects while addressing the depression and sleep as well.  CONTINUE Lexapro 10 mg  If GFR >40, will dc lexapro 10 mg and START Remeron 15 mg qHS x7days then 30 mg qHS  Orders: -     BASIC METABOLIC PANEL WITH GFR -     Escitalopram Oxalate; Take 1 tablet (10 mg total) by mouth daily.  Dispense: 30 tablet; Refill: 0  Stage 3a chronic kidney disease (HCC) Assessment & Plan: Continue home rx. Defer to PCP.  Orders: -     BASIC METABOLIC PANEL WITH GFR -     Potassium Chloride Crys ER; Take 2 tablets (40 mEq total) by mouth 2 (two) times daily.  Dispense: 360 tablet; Refill: 1 -     Sodium Bicarbonate; Take 1 tablet (650 mg total) by mouth 2 (two) times daily.  Dispense: 180 tablet; Refill: 1  Tobacco use disorder Assessment & Plan: Action stage. Down from smoking 1/2 PPD to 6 cigarettes daily. Declined medications to assist with cessation as he feels confident in his ability.  Continued to encourage cessation     Collaboration of Care: Collaboration of Care: Staffed with attending Dr. Adrian Blackwater   Patient/Guardian was advised Release of Information must be obtained prior to any record release in order to collaborate their care with an outside provider. Patient/Guardian was advised if they have not already done so to contact the registration  department to sign all necessary forms in order for [place] to release information regarding their care.   Consent: Patient/Guardian gives verbal consent for treatment and assignment of benefits for services provided during this visit. Patient/Guardian expressed understanding and agreed to proceed.   Princess Bruins, DO Psych Resident, PGY-2 [facility] 07/10/2022, 3:06 PM

## 2022-07-10 NOTE — Assessment & Plan Note (Signed)
Patient mood has improved after starting lexapro however, he is experiencing sexual side effects. Will continue lexapro at same dose for now until BMP drawn (07/10/2022) has resulted. If GFR >40 will proceed to switch patient to remeron and dc lexapro which should help with the sexual side effects while addressing the depression and sleep as well.  CONTINUE Lexapro 10 mg  If GFR >40, will dc lexapro 10 mg and START Remeron 15 mg qHS x7days then 30 mg qHS

## 2022-07-10 NOTE — Assessment & Plan Note (Signed)
Action stage. Down from smoking 1/2 PPD to 6 cigarettes daily. Declined medications to assist with cessation as he feels confident in his ability.  Continued to encourage cessation

## 2022-07-12 LAB — BASIC METABOLIC PANEL
BUN/Creatinine Ratio: 10
BUN: 17 mg/dL
CO2: 14 mmol/L — ABNORMAL LOW (ref 20–29)
Calcium: 8.8 mg/dL
Chloride: 111 mmol/L — ABNORMAL HIGH (ref 96–106)
Creatinine, Ser: 1.73 mg/dL
Glucose: 75 mg/dL (ref 70–99)
Potassium: 3.9 mmol/L (ref 3.5–5.2)
Sodium: 140 mmol/L (ref 134–144)

## 2022-07-12 LAB — SPECIMEN STATUS REPORT

## 2022-08-20 ENCOUNTER — Ambulatory Visit (HOSPITAL_COMMUNITY): Payer: Medicare Other | Admitting: Licensed Clinical Social Worker

## 2022-08-21 ENCOUNTER — Ambulatory Visit (HOSPITAL_COMMUNITY): Payer: Medicare Other | Admitting: Licensed Clinical Social Worker

## 2022-09-23 ENCOUNTER — Other Ambulatory Visit: Payer: Self-pay | Admitting: Nephrology

## 2022-09-23 DIAGNOSIS — N183 Chronic kidney disease, stage 3 unspecified: Secondary | ICD-10-CM

## 2022-10-15 ENCOUNTER — Other Ambulatory Visit: Payer: Medicare Other

## 2022-11-04 ENCOUNTER — Other Ambulatory Visit: Payer: Medicare Other

## 2022-11-04 ENCOUNTER — Ambulatory Visit
Admission: RE | Admit: 2022-11-04 | Discharge: 2022-11-04 | Disposition: A | Discharge status: Home / Self Care | Payer: Medicare Other | Source: Ambulatory Visit | Attending: Nephrology | Admitting: Nephrology

## 2022-11-04 DIAGNOSIS — N183 Chronic kidney disease, stage 3 unspecified: Secondary | ICD-10-CM

## 2023-03-25 LAB — VITAMIN D 25 HYDROXY (VIT D DEFICIENCY, FRACTURES): Vit D, 25-Hydroxy: 20.5

## 2023-03-25 LAB — BASIC METABOLIC PANEL
BUN: 20 (ref 4–21)
CO2: 19 (ref 13–22)
Chloride: 114 — AB (ref 99–108)
Creatinine: 1.8 — AB (ref 0.6–1.3)
Glucose: 87
Potassium: 3.8 meq/L (ref 3.5–5.1)
Sodium: 143 (ref 137–147)

## 2023-03-25 LAB — CBC AND DIFFERENTIAL
HCT: 51 (ref 41–53)
Hemoglobin: 17.6 — AB (ref 13.5–17.5)
Platelets: 262 10*3/uL (ref 150–400)
WBC: 10.8

## 2023-03-25 LAB — CBC: RBC: 5.68 — AB (ref 3.87–5.11)

## 2023-03-25 LAB — COMPREHENSIVE METABOLIC PANEL
Albumin: 4.7 (ref 3.5–5.0)
Calcium: 9.3 (ref 8.7–10.7)
eGFR: 49

## 2023-03-26 ENCOUNTER — Other Ambulatory Visit (INDEPENDENT_AMBULATORY_CARE_PROVIDER_SITE_OTHER): Payer: Medicare Other

## 2023-03-26 ENCOUNTER — Encounter: Payer: Self-pay | Admitting: Orthopaedic Surgery

## 2023-03-26 ENCOUNTER — Ambulatory Visit: Payer: Medicare Other | Admitting: Orthopaedic Surgery

## 2023-03-26 DIAGNOSIS — M545 Low back pain, unspecified: Secondary | ICD-10-CM

## 2023-03-26 DIAGNOSIS — G8929 Other chronic pain: Secondary | ICD-10-CM

## 2023-03-26 MED ORDER — HYDROCODONE-ACETAMINOPHEN 5-325 MG PO TABS: 1.0000 | ORAL_TABLET | Freq: Two times a day (BID) | ORAL | 0 refills | Status: AC | PRN

## 2023-03-26 NOTE — Progress Notes (Signed)
The patient has some that I saw her last in November of last year.  He has a family history of ankylosing spondylitis.  He has been developing worsening lumbar spine pain and we had wanted to obtain an MRI before but Medicaid denied him.  He is now developing worsening low back pain with bilateral radicular symptoms of sciatica.  He also has very stiff hips bilaterally.  He has tried and failed all forms of conservative treatment including weight loss, activity modification, anti-inflammatories and an exercise program with back strengthening and hip strengthening exercises.  He has chronic kidney stones so he has good limit his anti-inflammatory intake at this standpoint.  On exam both hips are incredibly stiff with range of motion.  Both hips are well located but are definitely painful.  Most of his pain is in the lumbar spine the lower aspect.  It does radiate in the sciatic regions on both sides.  X-rays of the lumbar spine also showed the top of both hips.  He does have overhanging osteophytes of both acetabulum.  He has anterior osteophytes of the lumbar spine at several levels.  There is still concerned about the potential for ankylosing spondylitis given the family history.  At this point given his significant worsening back pain combined with the failure conservative treatment for 10 months now, a MRI of his more to the lumbar spine to assess for nerve compression.  He is asking for some hydrocodone and I told him I can send in a one-time prescription but he should only use this sparingly.  We will see him back once we have this MRI.

## 2023-03-27 ENCOUNTER — Other Ambulatory Visit: Payer: Self-pay

## 2023-03-27 DIAGNOSIS — G8929 Other chronic pain: Secondary | ICD-10-CM

## 2023-04-02 ENCOUNTER — Emergency Department (HOSPITAL_BASED_OUTPATIENT_CLINIC_OR_DEPARTMENT_OTHER)
Admission: EM | Admit: 2023-04-02 | Discharge: 2023-04-02 | Disposition: A | Discharge status: Home / Self Care | Payer: Medicare Other | Attending: Emergency Medicine | Admitting: Emergency Medicine

## 2023-04-02 ENCOUNTER — Emergency Department (HOSPITAL_BASED_OUTPATIENT_CLINIC_OR_DEPARTMENT_OTHER): Payer: Medicare Other

## 2023-04-02 ENCOUNTER — Encounter (HOSPITAL_BASED_OUTPATIENT_CLINIC_OR_DEPARTMENT_OTHER): Payer: Self-pay

## 2023-04-02 DIAGNOSIS — E876 Hypokalemia: Secondary | ICD-10-CM | POA: Diagnosis not present

## 2023-04-02 DIAGNOSIS — N189 Chronic kidney disease, unspecified: Secondary | ICD-10-CM | POA: Insufficient documentation

## 2023-04-02 DIAGNOSIS — N200 Calculus of kidney: Secondary | ICD-10-CM | POA: Insufficient documentation

## 2023-04-02 DIAGNOSIS — R109 Unspecified abdominal pain: Secondary | ICD-10-CM | POA: Diagnosis present

## 2023-04-02 LAB — BASIC METABOLIC PANEL
Anion gap: 10 (ref 5–15)
BUN: 20 mg/dL (ref 6–20)
CO2: 15 mmol/L — ABNORMAL LOW (ref 22–32)
Calcium: 8.2 mg/dL — ABNORMAL LOW (ref 8.9–10.3)
Chloride: 114 mmol/L — ABNORMAL HIGH (ref 98–111)
Creatinine, Ser: 1.7 mg/dL — ABNORMAL HIGH (ref 0.61–1.24)
GFR, Estimated: 53 mL/min — ABNORMAL LOW (ref >60)
Glucose, Bld: 126 mg/dL — ABNORMAL HIGH (ref 70–99)
Potassium: 2.5 mmol/L — CL (ref 3.5–5.1)
Sodium: 139 mmol/L (ref 135–145)

## 2023-04-02 LAB — CBC
HCT: 46.6 % (ref 39.0–52.0)
Hemoglobin: 16.3 g/dL (ref 13.0–17.0)
MCH: 30.9 pg (ref 26.0–34.0)
MCHC: 35 g/dL (ref 30.0–36.0)
MCV: 88.3 fL (ref 80.0–100.0)
Platelets: 248 10*3/uL (ref 150–400)
RBC: 5.28 MIL/uL (ref 4.22–5.81)
RDW: 15.2 % (ref 11.5–15.5)
WBC: 9.3 10*3/uL (ref 4.0–10.5)
nRBC: 0 % (ref 0.0–0.2)

## 2023-04-02 LAB — URINALYSIS, MICROSCOPIC (REFLEX)

## 2023-04-02 LAB — URINALYSIS, ROUTINE W REFLEX MICROSCOPIC
Bilirubin Urine: NEGATIVE
Glucose, UA: NEGATIVE mg/dL
Ketones, ur: NEGATIVE mg/dL
Nitrite: NEGATIVE
Protein, ur: 30 mg/dL — AB
Specific Gravity, Urine: 1.02 (ref 1.005–1.030)
pH: 7 (ref 5.0–8.0)

## 2023-04-02 LAB — MAGNESIUM: Magnesium: 2.2 mg/dL (ref 1.7–2.4)

## 2023-04-02 MED ORDER — OXYCODONE HCL 5 MG PO TABS: 5.0000 mg | ORAL_TABLET | ORAL | 0 refills | Status: AC | PRN

## 2023-04-02 MED ORDER — POTASSIUM CHLORIDE 10 MEQ/100ML IV SOLN
10.0000 meq | INTRAVENOUS | Status: AC
  Administered 2023-04-02 (×3): 10 meq via INTRAVENOUS
  Filled 2023-04-02 (×3): qty 100

## 2023-04-02 MED ORDER — SODIUM CHLORIDE 0.9 % IV BOLUS
1000.0000 mL | Freq: Once | INTRAVENOUS | Status: AC
  Administered 2023-04-02: 1000 mL via INTRAVENOUS

## 2023-04-02 MED ORDER — SODIUM CHLORIDE 0.9 % IV SOLN: Freq: Once | INTRAVENOUS | Status: AC

## 2023-04-02 MED ORDER — HYDROMORPHONE HCL 1 MG/ML IJ SOLN
1.0000 mg | Freq: Once | INTRAMUSCULAR | Status: AC
  Administered 2023-04-02: 1 mg via INTRAVENOUS
  Filled 2023-04-02: qty 1

## 2023-04-02 MED ORDER — TAMSULOSIN HCL 0.4 MG PO CAPS: 0.4000 mg | ORAL_CAPSULE | Freq: Every day | ORAL | 0 refills | Status: AC

## 2023-04-02 MED ORDER — ONDANSETRON HCL 4 MG/2ML IJ SOLN
4.0000 mg | Freq: Once | INTRAMUSCULAR | Status: AC
  Administered 2023-04-02: 4 mg via INTRAVENOUS
  Filled 2023-04-02: qty 2

## 2023-04-02 MED ORDER — HYDROMORPHONE HCL 1 MG/ML IJ SOLN
0.5000 mg | Freq: Once | INTRAMUSCULAR | Status: AC
  Administered 2023-04-02: 0.5 mg via INTRAVENOUS
  Filled 2023-04-02: qty 1

## 2023-04-02 MED ORDER — POTASSIUM CHLORIDE 20 MEQ PO PACK
80.0000 meq | PACK | ORAL | Status: AC
  Administered 2023-04-02: 80 meq via ORAL
  Filled 2023-04-02: qty 4

## 2023-04-02 NOTE — Discharge Instructions (Signed)
You were seen for your kidney stone in the emergency department.  Your potassium was also low.  At home, please take Tylenol and ibuprofen for your pain. You may also take the oxycodone we have prescribed you for any breakthrough pain that may have.  Do not take this before driving or operating heavy machinery.  Do not take this medication with alcohol.  Continue to take your potassium at home.  Use the flowmax we have give you every day until the kidney stone passes. Please also strain your urine to collect the kidney stone. Store it in a container and take it to your urologist for analysis.   Follow-up with urology in a week to discuss your symptoms.  Follow-up with your primary doctor in 2 to 3 days to recheck your potassium.  Return immediately to the emergency department if you experience any of the following: fever, unbearable pain, urinary retention, or any other concerning symptoms.    Thank you for visiting our Emergency Department. It was a pleasure taking care of you today.

## 2023-04-02 NOTE — ED Provider Notes (Signed)
[place] [facility] Provider Note   CSN: [id number] Arrival date & time: 04/02/23  4098     History  Chief Complaint  Patient presents with   Abdominal Pain    Adam Weaver is a 37 y.o. male.  37 year old male with a history of kidney stones, familial hypokalemic periodic paralysis, and CKD who presents to the emergency department with left flank pain.  Patient states that for the past several weeks has had left-sided flank pain.  Says that 2 hours prior to arrival became unbearable.  Describes it as sharp.  Worsened with movement and breathing.  No fevers, dysuria, frequency, urgency, or hematuria.  Has also had some nausea and vomiting with it.  No diarrhea.  No abdominal surgeries.  Says that it feels identical to prior kidney stones.       Home Medications Prior to Admission medications   Medication Sig Start Date End Date Taking? Authorizing Provider  oxyCODONE (ROXICODONE) 5 MG immediate release tablet Take 1 tablet (5 mg total) by mouth every 4 (four) hours as needed for severe pain. 04/02/23  Yes Rondel Baton, MD  tamsulosin [facility]) 0.4 MG CAPS capsule Take 1 capsule (0.4 mg total) by mouth daily after breakfast. 04/02/23  Yes Rondel Baton, MD  Aspirin-Caffeine (BC FAST PAIN RELIEF) 845-65 MG PACK Take 1 packet by mouth daily as needed (headache/back pain.).    [provider]  escitalopram (LEXAPRO) 10 MG tablet Take 1 tablet (10 mg total) by mouth daily. 07/10/22 08/09/22  Princess Bruins, DO  HYDROcodone-acetaminophen (NORCO/VICODIN) 5-325 MG tablet Take 1 tablet by mouth 2 (two) times daily as needed for moderate pain. 03/26/23   Kathryne Hitch, MD  potassium chloride SA (KLOR-CON M) 20 MEQ tablet Take 2 tablets (40 mEq total) by mouth 2 (two) times daily. 07/10/22 01/06/23  Princess Bruins, DO      Allergies    Ciprofloxacin and Zithromax [azithromycin]    Review of Systems   Review of Systems  Physical  Exam Updated Vital Signs BP 115/76   Pulse 70   Temp 97.7 F (36.5 C)   Resp 18   Ht 6' (1.829 m)   Wt 111.1 kg   SpO2 93%   BMI 33.23 kg/m  Physical Exam Vitals and nursing note reviewed.  Constitutional:      General: He is not in acute distress.    Appearance: He is well-developed.  HENT:     Head: Normocephalic and atraumatic.     Right Ear: External ear normal.     Left Ear: External ear normal.     Nose: Nose normal.  Eyes:     Extraocular Movements: Extraocular movements intact.     Conjunctiva/sclera: Conjunctivae normal.     Pupils: Pupils are equal, round, and reactive to light.  Pulmonary:     Effort: Pulmonary effort is normal. No respiratory distress.  Abdominal:     General: There is no distension.     Palpations: Abdomen is soft. There is no mass.     Tenderness: There is no abdominal tenderness. There is no guarding.     Comments: Did not check for CVA tenderness since patient said that he was so tender that it would not be bearable.  Musculoskeletal:     Cervical back: Normal range of motion and neck supple.  Skin:    General: Skin is warm and dry.  Neurological:     Mental Status: He is alert. Mental status  is at baseline.  Psychiatric:        Mood and Affect: Mood normal.        Behavior: Behavior normal.     ED Results / Procedures / Treatments   Labs (all labs ordered are listed, but only abnormal results are displayed) Labs Reviewed  BASIC METABOLIC PANEL - Abnormal; Notable for the following components:      Result Value   Potassium 2.5 (*)    Chloride 114 (*)    CO2 15 (*)    Glucose, Bld 126 (*)    Creatinine, Ser 1.70 (*)    Calcium 8.2 (*)    GFR, Estimated 53 (*)    All other components within normal limits  URINALYSIS, ROUTINE W REFLEX MICROSCOPIC - Abnormal; Notable for the following components:   APPearance CLOUDY (*)    Hgb urine dipstick LARGE (*)    Protein, ur 30 (*)    Leukocytes,Ua TRACE (*)    All other components  within normal limits  URINALYSIS, MICROSCOPIC (REFLEX) - Abnormal; Notable for the following components:   Bacteria, UA FEW (*)    All other components within normal limits  CBC  MAGNESIUM    EKG None  Radiology CT Renal Stone Study  Result Date: 04/02/2023 CLINICAL DATA:  Acute left flank pain beginning 1 hour ago. Nephrolithiasis. EXAM: CT ABDOMEN AND PELVIS WITHOUT CONTRAST TECHNIQUE: Multidetector CT imaging of the abdomen and pelvis was performed following the standard protocol without IV contrast. RADIATION DOSE REDUCTION: This exam was performed according to the departmental dose-optimization program which includes automated exposure control, adjustment of the mA and/or kV according to patient size and/or use of iterative reconstruction technique. COMPARISON:  11/23/2019 FINDINGS: Lower chest: No acute findings. Hepatobiliary: No mass visualized on this unenhanced exam. Gallbladder is unremarkable. No evidence of biliary ductal dilatation. Pancreas: No mass or inflammatory process visualized on this unenhanced exam. Spleen:  Within normal limits in size. 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 Mild left hydronephrosis is seen due to a 4 mm calculus at the left UPJ and a 5 mm calculus in the proximal left ureter. Stomach/Bowel: No evidence of obstruction, inflammatory process, or abnormal fluid collections. Normal appendix visualized. Vascular/Lymphatic: No pathologically enlarged lymph nodes identified. No evidence of abdominal aortic aneurysm. Reproductive:  No mass or other significant abnormality. Other:  None. Musculoskeletal:  No suspicious bone lesions identified. IMPRESSION: Mild left hydronephrosis due to 4 mm calculus at the left UPJ and 5 mm calculus in the proximal left ureter. Bilateral renal medullary nephrocalcinosis. Electronically Signed   By: Danae Orleans M.D.   On: 04/02/2023 09:00    Procedures Procedures    Medications Ordered  in ED Medications  potassium chloride 10 mEq in 100 mL IVPB (10 mEq Intravenous New Bag/Given 04/02/23 1041)  HYDROmorphone (DILAUDID) injection 1 mg (1 mg Intravenous Given 04/02/23 0734)  ondansetron (ZOFRAN) injection 4 mg (4 mg Intravenous Given 04/02/23 0734)  potassium chloride (KLOR-CON) packet 80 mEq (80 mEq Oral Given 04/02/23 0827)  sodium chloride 0.9 % bolus 1,000 mL (0 mLs Intravenous Stopped 04/02/23 0928)  0.9 %  sodium chloride infusion ( Intravenous New Bag/Given 04/02/23 0824)  HYDROmorphone (DILAUDID) injection 0.5 mg (0.5 mg Intravenous Given 04/02/23 0959)    ED Course/ Medical Decision Making/ A&P Clinical Course as of 04/02/23 1133  Wed Apr 02, 2023  1133 Creatinine(!): 1.70 At baseline [RP]    Clinical Course User Index [RP] Rondel Baton, MD  Medical Decision Making Amount and/or Complexity of Data Reviewed Labs: ordered. Radiology: ordered.  Risk Prescription drug management.   Imad Shostak is a 37 y.o. male with comorbidities that complicate the patient evaluation including kidney stones, familial hypokalemic periodic paralysis, and CKD who presents to the emergency department with left flank pain.     Initial Ddx:  Kidney stone, pyelonephritis, muscle strain, hypokalemia  MDM/Course:  Patient presents emergency department left sided flank pain.  Does have a history of kidney stones and was concerned about this.  Given his CKD says that he is unable to receive NSAIDs so was given Dilaudid and Zofran for his pain.  CT scan did show 2 stones in his left ureter.  One that was 5 mm and another that was 4 mm.  Pain was controlled with the Dilaudid and Zofran.  Urinalysis not consistent with UTI.  Was also found to have severe hypokalemia.  Aggressively replenished his potassium in the emergency department with multiple rounds of IV potassium and oral KCl.  Upon re-evaluation patient's pain remained controlled and he was  tolerating p.o. without difficulty.  Did offer the patient admission at this time due to his low potassium history of hypokalemic periodic paralysis but he declined and stated that he would prefer to try and treat this as an outpatient.  Already has potassium supplement at home to take.  Will have him follow-up with his primary doctor in several days for potassium recheck and urology for further management of his kidney stone.  This patient presents to the ED for concern of complaints listed in HPI, this involves an extensive number of treatment options, and is a complaint that carries with it a high risk of complications and morbidity. Disposition including potential need for admission considered.   Dispo: DC Home. Return precautions discussed including, but not limited to, those listed in the AVS. Allowed pt time to ask questions which were answered fully prior to dc.  Records reviewed Outpatient Clinic Notes The following labs were independently interpreted: Chemistry and show CKD I independently reviewed the following imaging with scope of interpretation limited to determining acute life threatening conditions related to emergency care: CT Abdomen/Pelvis and agree with the radiologist interpretation with the following exceptions: none I personally reviewed and interpreted cardiac monitoring: normal sinus rhythm  I personally reviewed and interpreted the pt's EKG: see above for interpretation  I have reviewed the patients home medications and made adjustments as needed  Portions of this note were generated with Dragon dictation software. Dictation errors may occur despite best attempts at proofreading.    CRITICAL CARE Performed by: Rondel Baton   Total critical care time: 30 minutes  Critical care time was exclusive of separately billable procedures and treating other patients.  Critical care was necessary to treat or prevent imminent or life-threatening deterioration.  Critical  care was time spent personally by me on the following activities: development of treatment plan with patient and/or surrogate as well as nursing, discussions with consultants, evaluation of patient's response to treatment, examination of patient, obtaining history from patient or surrogate, ordering and performing treatments and interventions, ordering and review of laboratory studies, ordering and review of radiographic studies, pulse oximetry and re-evaluation of patient's condition.  Final Clinical Impression(s) / ED Diagnoses Final diagnoses:  Kidney stone  Hypokalemia    Rx / DC Orders ED Discharge Orders          Ordered    oxyCODONE (ROXICODONE) 5 MG immediate release tablet  Every  4 hours PRN        04/02/23 1108    tamsulosin (FLOMAX) 0.4 MG CAPS capsule  Daily after breakfast        04/02/23 1108              Rondel Baton, MD 04/02/23 1133

## 2023-04-02 NOTE — ED Notes (Addendum)
Date and time results received: 04/02/23  0757  Test: K Critical Value: 2.5  Name of Provider Notified:  Eloise Harman  Orders Received? Or Actions Taken?:

## 2023-04-02 NOTE — ED Triage Notes (Signed)
Pt reports that he started having abdominal pain about an hour ago. States that he had hx of kidney stones and he thinks he has one now.

## 2023-04-02 NOTE — ED Notes (Signed)
Lab called with K result of 2.5 for pt. EDP made aware.

## 2023-04-08 ENCOUNTER — Ambulatory Visit (INDEPENDENT_AMBULATORY_CARE_PROVIDER_SITE_OTHER): Payer: Medicare Other | Admitting: Family Medicine

## 2023-04-08 ENCOUNTER — Encounter: Payer: Self-pay | Admitting: Family Medicine

## 2023-04-08 VITALS — BP 138/86 | HR 80 | Temp 97.7°F | Wt 250.4 lb

## 2023-04-08 DIAGNOSIS — N200 Calculus of kidney: Secondary | ICD-10-CM | POA: Diagnosis not present

## 2023-04-08 DIAGNOSIS — G8929 Other chronic pain: Secondary | ICD-10-CM

## 2023-04-08 DIAGNOSIS — M5442 Lumbago with sciatica, left side: Secondary | ICD-10-CM

## 2023-04-08 DIAGNOSIS — F602 Antisocial personality disorder: Secondary | ICD-10-CM

## 2023-04-08 DIAGNOSIS — F332 Major depressive disorder, recurrent severe without psychotic features: Secondary | ICD-10-CM | POA: Diagnosis not present

## 2023-04-08 DIAGNOSIS — M25521 Pain in right elbow: Secondary | ICD-10-CM

## 2023-04-08 DIAGNOSIS — M5441 Lumbago with sciatica, right side: Secondary | ICD-10-CM

## 2023-04-08 MED ORDER — DICLOFENAC SODIUM 1 % EX GEL
4.0000 g | Freq: Four times a day (QID) | CUTANEOUS | 3 refills | Status: AC | PRN
Start: 2023-04-08 — End: ?

## 2023-04-08 MED ORDER — ESCITALOPRAM OXALATE 10 MG PO TABS
5.0000 mg | ORAL_TABLET | Freq: Every day | ORAL | 0 refills | Status: AC
Start: 2023-04-08 — End: 2023-07-07

## 2023-04-08 NOTE — Patient Instructions (Signed)
For elbow xray, go to:    [place] [facility] [address] [place], [place], [place] [address] Phone: [phone]

## 2023-04-08 NOTE — Progress Notes (Unsigned)
Assessment/Plan:   Problem List Items Addressed This Visit       Genitourinary   Nephrolithiasis - Primary   Relevant Orders   Ambulatory referral to Urology    Medications Discontinued During This Encounter  Medication Reason  . escitalopram (LEXAPRO) 10 MG tablet     No follow-ups on file.    Subjective:   Encounter date: 04/08/2023  Adam Weaver is a 37 y.o. male who has Hypokalemia; ARF (acute renal failure) (HCC); Leucocytosis; Dehydration; Hypokalemic periodic paralysis; Familial hypokalemic periodic paralysis; Acute kidney injury superimposed on CKD (HCC); Right nephrolithiasis; Obesity (BMI 30-39.9); Erythrocytosis; Hyperglycemia; Nephrolithiasis; Hypokalemia due to excessive renal loss of potassium; Hemorrhoids; Chronic bilateral low back pain; MDD (major depressive disorder), recurrent episode (HCC); Tobacco use disorder; GAD (generalized anxiety disorder); and Stage 3a chronic kidney disease (HCC) on their problem list..   He  has a past medical history of Acute renal failure (HCC) (12/05/2017), Anxiety (2018), Depression (2016), History of kidney stones, Hypokalemia, Hypokalemic periodic paralysis, Kidney stone (06/22/2019), Kidney stones, MDD (major depressive disorder), recurrent episode (HCC) (06/08/2022), Renal disorder, and Tobacco use disorder (06/08/2022).Marland Kitchen   He presents with chief complaint of Medical Management of Chronic Issues (Referral to psychologist. Rt elbow pain x 3 weeks. ) .   HPI:   ROS  Past Surgical History:  Procedure Laterality Date  . CYSTOSCOPY W/ URETERAL STENT PLACEMENT Right 06/21/2019   Procedure: CYSTOSCOPY WITH RIGHT URETERAL STENT PLACEMENT;  Surgeon: Noel Christmas, MD;  Location: [facility];  Service: Urology;  Laterality: Right;  . CYSTOSCOPY WITH RETROGRADE PYELOGRAM, URETEROSCOPY AND STENT PLACEMENT Right 07/20/2019   Procedure: CYSTOSCOPY WITH RETROGRADE PYELOGRAM, URETEROSCOPY AND STENT PLACEMENT;  Surgeon: Noel Christmas, MD;  Location: [facility];  Service: Urology;  Laterality: Right;  90 MINS  . HOLMIUM LASER APPLICATION Right 07/20/2019   Procedure: HOLMIUM LASER APPLICATION;  Surgeon: Noel Christmas, MD;  Location: [facility];  Service: Urology;  Laterality: Right;  . HOLMIUM LASER APPLICATION Right 10/26/2019   Procedure: HOLMIUM LASER APPLICATION;  Surgeon: Noel Christmas, MD;  Location: [facility];  Service: Urology;  Laterality: Right;  . IR URETERAL STENT RIGHT NEW ACCESS W/O SEP NEPHROSTOMY CATH  10/26/2019  . LITHOTRIPSY    . NEPHROLITHOTOMY Right 10/26/2019   Procedure: NEPHROLITHOTOMY PERCUTANEOUS;  Surgeon: Noel Christmas, MD;  Location: [facility];  Service: Urology;  Laterality: Right;  3 HRS  . PINS IN RIGHT ARM     LATER REMOVED    Outpatient Medications Prior to Visit  Medication Sig Dispense Refill  . Aspirin-Caffeine (BC FAST PAIN RELIEF) 845-65 MG PACK Take 1 packet by mouth daily as needed (headache/back pain.).    Marland Kitchen tamsulosin (FLOMAX) 0.4 MG CAPS capsule Take 1 capsule (0.4 mg total) by mouth daily after breakfast. 30 capsule 0  . HYDROcodone-acetaminophen (NORCO/VICODIN) 5-325 MG tablet Take 1 tablet by mouth 2 (two) times daily as needed for moderate pain. (Patient not taking: Reported on 04/08/2023) 30 tablet 0  . oxyCODONE (ROXICODONE) 5 MG immediate release tablet Take 1 tablet (5 mg total) by mouth every 4 (four) hours as needed for severe pain. (Patient not taking: Reported on 04/08/2023) 15 tablet 0  . potassium chloride SA (KLOR-CON M) 20 MEQ tablet Take 2 tablets (40 mEq total) by mouth 2 (two) times daily. 360 tablet 1  . escitalopram (LEXAPRO) 10 MG tablet Take 1 tablet (10 mg total) by mouth daily. 30 tablet 0   No facility-administered  medications prior to visit.    Family History  Problem Relation Age of Onset  . Diabetes Mother   . Heart disease Mother   . Hypertension Mother   . COPD Father   . Arthritis Maternal Grandfather    . Arthritis Maternal Grandmother   . Cancer Maternal Aunt   . Kidney disease Maternal Aunt     Social History   Socioeconomic History  . Marital status: Significant Other    Spouse name: Not on file  . Number of children: Not on file  . Years of education: Not on file  . Highest education level: 12th grade  Occupational History  . Not on file  Tobacco Use  . Smoking status: Every Day    Current packs/day: 1.00    Average packs/day: 1 pack/day for 3.0 years (3.0 ttl pk-yrs)    Types: Cigarettes, E-cigarettes    Passive exposure: Never  . Smokeless tobacco: Never  Vaping Use  . Vaping status: Former  Substance and Sexual Activity  . Alcohol use: Yes    Alcohol/week: 1.0 standard drink of alcohol    Types: 1 Cans of beer per week    Comment: RARE  . Drug use: Never  . Sexual activity: Yes    Birth control/protection: None  Other Topics Concern  . Not on file  Social History Narrative  . Not on file   Social Determinants of Health   Financial Resource Strain: Low Risk  (04/07/2023)   Overall Financial Resource Strain (CARDIA)   . Difficulty of Paying Living Expenses: Not very hard  Food Insecurity: No Food Insecurity (04/07/2023)   Hunger Vital Sign   . Worried About Programme researcher, broadcasting/film/video in the Last Year: Never true   . Ran Out of Food in the Last Year: Never true  Transportation Needs: No Transportation Needs (04/07/2023)   PRAPARE - Transportation   . Lack of Transportation (Medical): No   . Lack of Transportation (Non-Medical): No  Physical Activity: Sufficiently Active (04/07/2023)   Exercise Vital Sign   . Days of Exercise per Week: 3 days   . Minutes of Exercise per Session: 60 min  Stress: Stress Concern Present (04/07/2023)   [employer] of Occupational Health - Occupational Stress Questionnaire   . Feeling of Stress : Very much  Social Connections: Socially Isolated (04/07/2023)   Social Connection and Isolation Panel [NHANES]   . Frequency of  Communication with Friends and Family: Once a week   . Frequency of Social Gatherings with Friends and Family: Twice a week   . Attends Religious Services: Never   . Active Member of Clubs or Organizations: No   . Attends Banker Meetings: Not on file   . Marital Status: Divorced  Catering manager Violence: Not At Risk (04/13/2022)   Humiliation, Afraid, Rape, and Kick questionnaire   . Fear of Current or Ex-Partner: No   . Emotionally Abused: No   . Physically Abused: No   . Sexually Abused: No  Objective:  Physical Exam: BP 138/86 (BP Location: Left Arm, Patient Position: Sitting, Cuff Size: Large)   Pulse 80   Temp 97.7 F (36.5 C) (Temporal)   Wt 250 lb 6.4 oz (113.6 kg)   SpO2 98%   BMI 33.96 kg/m     Physical Exam  CT Renal Stone Study  Result Date: 04/02/2023 CLINICAL DATA:  Acute left flank pain beginning 1 hour ago. Nephrolithiasis. EXAM: CT ABDOMEN AND PELVIS WITHOUT CONTRAST TECHNIQUE: Multidetector CT imaging of the abdomen and pelvis was performed following the standard protocol without IV contrast. RADIATION DOSE REDUCTION: This exam was performed according to the departmental dose-optimization program which includes automated exposure control, adjustment of the mA and/or kV according to patient size and/or use of iterative reconstruction technique. COMPARISON:  11/23/2019 FINDINGS: Lower chest: No acute findings. Hepatobiliary: No mass visualized on this unenhanced exam. Gallbladder is unremarkable. No evidence of biliary ductal dilatation. Pancreas: No mass or inflammatory process visualized on this unenhanced exam. Spleen:  Within normal limits in size. 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 Mild left hydronephrosis is seen due to a 4 mm calculus at the left UPJ and a 5 mm calculus in the proximal left ureter.  Stomach/Bowel: No evidence of obstruction, inflammatory process, or abnormal fluid collections. Normal appendix visualized. Vascular/Lymphatic: No pathologically enlarged lymph nodes identified. No evidence of abdominal aortic aneurysm. Reproductive:  No mass or other significant abnormality. Other:  None. Musculoskeletal:  No suspicious bone lesions identified. IMPRESSION: Mild left hydronephrosis due to 4 mm calculus at the left UPJ and 5 mm calculus in the proximal left ureter. Bilateral renal medullary nephrocalcinosis. Electronically Signed   By: Danae Orleans M.D.   On: 04/02/2023 09:00   XR Lumbar Spine 2-3 Views  Result Date: 03/26/2023 2 views of the lumbar spine shows anterior bridging osteophytes at L3-L4 and L4-L5.  The alignment is well-maintained.   Recent Results (from the past 2160 hour(s))  CBC and differential     Status: Abnormal   Collection Time: 03/25/23 12:00 AM  Result Value Ref Range   Hemoglobin 17.6 (A) 13.5 - 17.5   HCT 51 41 - 53   Platelets 262 150 - 400 K/uL   WBC 10.8   CBC     Status: Abnormal   Collection Time: 03/25/23 12:00 AM  Result Value Ref Range   RBC 5.68 (A) 3.87 - 5.11  VITAMIN D 25 Hydroxy (Vit-D Deficiency, Fractures)     Status: None   Collection Time: 03/25/23 12:00 AM  Result Value Ref Range   Vit D, 25-Hydroxy 20.5   Basic metabolic panel     Status: Abnormal   Collection Time: 03/25/23 12:00 AM  Result Value Ref Range   Glucose 87    BUN 20 4 - 21   CO2 19 13 - 22   Creatinine 1.8 (A) 0.6 - 1.3   Potassium 3.8 3.5 - 5.1 mEq/L   Sodium 143 137 - 147   Chloride 114 (A) 99 - 108  Comprehensive metabolic panel     Status: None   Collection Time: 03/25/23 12:00 AM  Result Value Ref Range   eGFR 49    Calcium 9.3 8.7 - 10.7   Albumin 4.7 3.5 - 5.0  Basic metabolic panel     Status: Abnormal   Collection Time: 04/02/23  7:28 AM  Result Value Ref Range   Sodium 139 135 - 145 mmol/L   Potassium 2.5 (LL) 3.5 - 5.1 mmol/L  Comment:  CRITICAL RESULT CALLED TO, READ BACK BY AND VERIFIED WITH CALL TO ROBIN C RN AT [phone] 04/02/2023 BY KENYATTA BOWMAN    Chloride 114 (H) 98 - 111 mmol/L   CO2 15 (L) 22 - 32 mmol/L   Glucose, Bld 126 (H) 70 - 99 mg/dL    Comment: Glucose reference range applies only to samples taken after fasting for at least 8 hours.   BUN 20 6 - 20 mg/dL   Creatinine, Ser 5.40 (H) 0.61 - 1.24 mg/dL   Calcium 8.2 (L) 8.9 - 10.3 mg/dL   GFR, Estimated 53 (L) >60 mL/min    Comment: (NOTE) Calculated using the CKD-EPI Creatinine Equation (2021)    Anion gap 10 5 - 15    Comment: Performed at [facility], [address] [place], [place] [address]  CBC     Status: None   Collection Time: 04/02/23  7:28 AM  Result Value Ref Range   WBC 9.3 4.0 - 10.5 K/uL   RBC 5.28 4.22 - 5.81 MIL/uL   Hemoglobin 16.3 13.0 - 17.0 g/dL   HCT 14.7 82.9 - 56.2 %   MCV 88.3 80.0 - 100.0 fL   MCH 30.9 26.0 - 34.0 pg   MCHC 35.0 30.0 - 36.0 g/dL   RDW 13.0 86.5 - 78.4 %   Platelets 248 150 - 400 K/uL   nRBC 0.0 0.0 - 0.2 %    Comment: Performed at [facility], [address] [place], [place] [address]  Urinalysis, Routine w reflex microscopic -Urine, Clean Catch     Status: Abnormal   Collection Time: 04/02/23  7:28 AM  Result Value Ref Range   Color, Urine YELLOW YELLOW   APPearance CLOUDY (A) CLEAR   Specific Gravity, Urine 1.020 1.005 - 1.030   pH 7.0 5.0 - 8.0   Glucose, UA NEGATIVE NEGATIVE mg/dL   Hgb urine dipstick LARGE (A) NEGATIVE   Bilirubin Urine NEGATIVE NEGATIVE   Ketones, ur NEGATIVE NEGATIVE mg/dL   Protein, ur 30 (A) NEGATIVE mg/dL   Nitrite NEGATIVE NEGATIVE   Leukocytes,Ua TRACE (A) NEGATIVE    Comment: Performed at [facility], [address] [place], [place] [address]  Magnesium     Status: None   Collection Time: 04/02/23  7:28 AM  Result Value Ref Range   Magnesium 2.2 1.7 - 2.4 mg/dL    Comment: Performed at [facility], [address] [place], [place] [address]  Urinalysis, Microscopic (reflex)     Status: Abnormal   Collection Time: 04/02/23  7:28 AM  Result Value Ref Range   RBC / HPF 6-10 0 - 5 RBC/hpf   WBC, UA 0-5 0 - 5 WBC/hpf   Bacteria, UA FEW (A) NONE SEEN   Squamous Epithelial / HPF 0-5 0 - 5 /HPF    Comment: Performed at [facility], [address] [place], [place] [address]        Garner Nash, MD, MS

## 2023-04-10 DIAGNOSIS — M25521 Pain in right elbow: Secondary | ICD-10-CM | POA: Insufficient documentation

## 2023-04-10 NOTE — Assessment & Plan Note (Signed)
Order X-ray of the right elbow. Prescribe Diclofenac gel, instructed use and reassessment for effectiveness Suggest follow-up with orthopedics if no improvement.

## 2023-04-10 NOTE — Assessment & Plan Note (Signed)
Restart escitalopram 5 mg with monitoring Referral back to Psychiatry, specifically Dr. Princess Bruins

## 2023-04-10 NOTE — Assessment & Plan Note (Addendum)
Follow-up with Orthopedics after MRI results. Also eferral to pain management for assessment of other options

## 2023-04-10 NOTE — Assessment & Plan Note (Signed)
Place another referral to Dr. Arita Miss at [facility] for recurrent kidney stones.

## 2023-05-25 ENCOUNTER — Inpatient Hospital Stay
Admission: RE | Admit: 2023-05-25 | Discharge: 2023-05-25 | Discharge status: Home / Self Care | Payer: Medicare Other | Source: Ambulatory Visit | Attending: Orthopaedic Surgery | Admitting: Orthopaedic Surgery

## 2023-05-25 DIAGNOSIS — G8929 Other chronic pain: Secondary | ICD-10-CM

## 2023-06-05 ENCOUNTER — Other Ambulatory Visit: Payer: Self-pay | Admitting: Nephrology

## 2023-06-05 DIAGNOSIS — N183 Chronic kidney disease, stage 3 unspecified: Secondary | ICD-10-CM

## 2023-06-06 ENCOUNTER — Other Ambulatory Visit: Payer: Medicare Other

## 2023-06-09 ENCOUNTER — Ambulatory Visit (INDEPENDENT_AMBULATORY_CARE_PROVIDER_SITE_OTHER): Payer: Medicare Other | Admitting: Orthopaedic Surgery

## 2023-06-09 ENCOUNTER — Encounter: Payer: Self-pay | Admitting: Orthopaedic Surgery

## 2023-06-09 ENCOUNTER — Telehealth: Payer: Self-pay | Admitting: Orthopaedic Surgery

## 2023-06-09 ENCOUNTER — Other Ambulatory Visit: Payer: Self-pay

## 2023-06-09 ENCOUNTER — Ambulatory Visit: Payer: Medicare Other

## 2023-06-09 ENCOUNTER — Ambulatory Visit: Payer: Medicare Other | Admitting: Orthopaedic Surgery

## 2023-06-09 DIAGNOSIS — N2889 Other specified disorders of kidney and ureter: Secondary | ICD-10-CM

## 2023-06-09 DIAGNOSIS — M1611 Unilateral primary osteoarthritis, right hip: Secondary | ICD-10-CM

## 2023-06-09 DIAGNOSIS — N289 Disorder of kidney and ureter, unspecified: Secondary | ICD-10-CM

## 2023-06-09 DIAGNOSIS — M545 Low back pain, unspecified: Secondary | ICD-10-CM

## 2023-06-09 DIAGNOSIS — M1612 Unilateral primary osteoarthritis, left hip: Secondary | ICD-10-CM | POA: Diagnosis not present

## 2023-06-09 DIAGNOSIS — G8929 Other chronic pain: Secondary | ICD-10-CM

## 2023-06-09 NOTE — Progress Notes (Signed)
The patient is a 37 year old gentleman who is coming in for follow-up after having a MRI of his lumbar spine.  The MRI showed some moderate facet arthrosis at L3-L4 to the left side but otherwise just some mild degenerative changes in his lumbar spine.  There was no nerve compression.  There is no evidence of foraminal or central stenosis.  He does have some chronic kidney issues with stones in both kidneys.  He has had urologic follow-up for this.  However the radiologist is concerned about an area of the left kidney that appeared hemorrhagic and they cannot rule out any neoplasm.  They have recommended an abdominal MRI with renal protocol and with and without contrast with subtraction imaging to better assess this area.  I think that is reasonable to order that study and then have him a referral to Dr. Arita Miss with [facility] who is seen him in the past.  I did go over the MRI findings of the lumbar spine.  He is going to take these findings to his pain specialist.  On exam he still has quite a bit of stiffness in significant pain with rotating both hips.  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  This correlates with the stiffness and pain as well.  At some point he may end up considering hip replacement surgery for his hips.  He does have significant pain again when I try to put his hips through range of motion and there is stiffness.  I would like to see him back in 6 weeks to evaluate his hips again but no x-rays are needed.  We will send him for the abdominal MRI per the radiologist and make referral to Dr. Arita Miss with [facility].

## 2023-06-09 NOTE — Telephone Encounter (Signed)
Radiology called and just wanted you to look at #3 in the MRI. [phone]

## 2023-06-23 ENCOUNTER — Ambulatory Visit
Admission: RE | Admit: 2023-06-23 | Discharge: 2023-06-23 | Disposition: A | Discharge status: Home / Self Care | Payer: Medicare Other | Source: Ambulatory Visit | Attending: Nephrology | Admitting: Nephrology

## 2023-06-23 DIAGNOSIS — N183 Chronic kidney disease, stage 3 unspecified: Secondary | ICD-10-CM

## 2023-06-30 ENCOUNTER — Ambulatory Visit
Admission: RE | Admit: 2023-06-30 | Discharge: 2023-06-30 | Disposition: A | Discharge status: Home / Self Care | Payer: Medicare Other | Source: Ambulatory Visit | Attending: Orthopaedic Surgery | Admitting: Orthopaedic Surgery

## 2023-06-30 DIAGNOSIS — N289 Disorder of kidney and ureter, unspecified: Secondary | ICD-10-CM

## 2023-06-30 DIAGNOSIS — N2889 Other specified disorders of kidney and ureter: Secondary | ICD-10-CM

## 2023-06-30 MED ORDER — GADOPICLENOL 0.5 MMOL/ML IV SOLN
10.0000 mL | Freq: Once | INTRAVENOUS | Status: AC | PRN
  Administered 2023-06-30: 10 mL via INTRAVENOUS

## 2023-07-21 ENCOUNTER — Ambulatory Visit: Payer: Medicare Other | Admitting: Orthopaedic Surgery

## 2024-01-16 ENCOUNTER — Telehealth: Payer: Self-pay

## 2024-01-16 ENCOUNTER — Ambulatory Visit (INDEPENDENT_AMBULATORY_CARE_PROVIDER_SITE_OTHER)

## 2024-01-16 DIAGNOSIS — F332 Major depressive disorder, recurrent severe without psychotic features: Secondary | ICD-10-CM

## 2024-01-16 DIAGNOSIS — Z0000 Encounter for general adult medical examination without abnormal findings: Secondary | ICD-10-CM | POA: Diagnosis not present

## 2024-01-16 DIAGNOSIS — F602 Antisocial personality disorder: Secondary | ICD-10-CM

## 2024-01-16 NOTE — Telephone Encounter (Signed)
 Patient is requesting a refill of escitalopram .

## 2024-01-16 NOTE — Patient Instructions (Signed)
 Mr. Adam Weaver , Thank you for taking time out of your busy schedule to complete your Annual Wellness Visit with me. I enjoyed our conversation and look forward to speaking with you again next year. I, as well as your care team,  appreciate your ongoing commitment to your health goals. Please review the following plan we discussed and let me know if I can assist you in the future. Your Game plan/ To Do List    Referrals: If you haven't heard from the office you've been referred to, please reach out to them at the phone provided.  N/a Follow up Visits: Next Medicare AWV with our clinical staff: will make when closer to time   Have you seen your provider in the last 6 months (3 months if uncontrolled diabetes)? Yes Next Office Visit with your provider: will call and schedule at another time  Clinician Recommendations:  Aim for 30 minutes of exercise or brisk walking, 6-8 glasses of water, and 5 servings of fruits and vegetables each day.       This is a list of the screening recommended for you and due dates:  Health Maintenance  Topic Date Due   Hepatitis C Screening  Never done   Pneumococcal Vaccination (1 of 2 - PCV) Never done   Hepatitis B Vaccine (1 of 3 - 19+ 3-dose series) Never done   HPV Vaccine (1 - 3-dose SCDM series) Never done   COVID-19 Vaccine (1 - 2024-25 season) Never done   Flu Shot  01/30/2024   Medicare Annual Wellness Visit  01/15/2025   DTaP/Tdap/Td vaccine (2 - Td or Tdap) 04/23/2032   HIV Screening  Completed   Meningitis B Vaccine  Aged Out    Advanced directives: (ACP Link)Information on Advanced Care Planning can be found at [place]  Secretary of [facility] Advance Health Care Directives Advance Health Care Directives. [email or web address]  Advance Care Planning is important because it:  [x]  Makes sure you receive the medical care that is consistent with your values, goals, and preferences  [x]  It provides guidance to your family and loved ones and reduces their  decisional burden about whether or not they are making the right decisions based on your wishes.  Follow the link provided in your after visit summary or read over the paperwork we have mailed to you to help you started getting your Advance Directives in place. If you need assistance in completing these, please reach out to us  so that we can help you!  See attachments for Preventive Care and Fall Prevention Tips.

## 2024-01-16 NOTE — Progress Notes (Signed)
 Subjective:   Adam Weaver is a 38 y.o. who presents for a Medicare Wellness preventive visit.  As a reminder, Annual Wellness Visits don't include a physical exam, and some assessments may be limited, especially if this visit is performed virtually. We may recommend an in-person follow-up visit with your provider if needed.  Visit Complete: Virtual I connected with  Adam Weaver on 01/16/24 by a video and audio enabled telemedicine application and verified that I am speaking with the correct person using two identifiers.  Patient Location: Home  Provider Location: Office/Clinic  I discussed the limitations of evaluation and management by telemedicine. The patient expressed understanding and agreed to proceed.  Vital Signs: Because this visit was a virtual/telehealth visit, some criteria may be missing or patient reported. Any vitals not documented were not able to be obtained and vitals that have been documented are patient reported.    Persons Participating in Visit: Patient.  AWV Questionnaire: No: Patient Medicare AWV questionnaire was not completed prior to this visit.  Cardiac Risk Factors include: advanced age (>15men, >15 women);male gender     Objective:    Today's Vitals   There is no height or weight on file to calculate BMI.     01/16/2024   11:28 AM 04/02/2023    7:21 AM 04/13/2022    2:58 PM 04/13/2022    9:21 AM 11/23/2019    9:24 PM 10/26/2019    8:36 AM 10/15/2019   10:11 AM  Advanced Directives  Does Patient Have a Medical Advance Directive? No No  No No No No  Would patient like information on creating a medical advance directive? No - Patient declined No - Patient declined Yes (Inpatient - patient requests chaplain consult to create a medical advance directive)   No - Patient declined     Current Medications (verified) Outpatient Encounter Medications as of 01/16/2024  Medication Sig   Aspirin-Caffeine (BC FAST PAIN RELIEF) 845-65 MG PACK Take 1  packet by mouth daily as needed (headache/back pain.).   HYDROcodone -acetaminophen  (NORCO/VICODIN) 5-325 MG tablet Take 1 tablet by mouth 2 (two) times daily as needed for moderate pain.   oxyCODONE  (ROXICODONE ) 5 MG immediate release tablet Take 1 tablet (5 mg total) by mouth every 4 (four) hours as needed for severe pain.   potassium chloride  SA (KLOR-CON  M) 20 MEQ tablet Take 2 tablets (40 mEq total) by mouth 2 (two) times daily.   diclofenac  Sodium (VOLTAREN ) 1 % GEL Apply 4 g topically 4 (four) times daily as needed. (Patient not taking: Reported on 01/16/2024)   escitalopram  (LEXAPRO ) 10 MG tablet Take 0.5 tablets (5 mg total) by mouth daily. (Patient not taking: Reported on 01/16/2024)   tamsulosin  (FLOMAX ) 0.4 MG CAPS capsule Take 1 capsule (0.4 mg total) by mouth daily after breakfast. (Patient not taking: Reported on 01/16/2024)   No facility-administered encounter medications on file as of 01/16/2024.    Allergies (verified) Ciprofloxacin and Zithromax [azithromycin]   History: Past Medical History:  Diagnosis Date   Acute renal failure (HCC) 12/05/2017   Anxiety 2018   Depression 2016   History of kidney stones    Hypokalemia    Hypokalemic periodic paralysis    POTASSIUM GETS LOW TROUBLE MOVING DAY AFTER SURGERY   Kidney stone 06/22/2019   Kidney stones    MDD (major depressive disorder), recurrent episode (HCC) 06/08/2022   Renal disorder    STAGE 3 CKD, NO NEPHROLOGIST   Tobacco use disorder 06/08/2022   Past Surgical History:  Procedure Laterality Date   CYSTOSCOPY W/ URETERAL STENT PLACEMENT Right 06/21/2019   Procedure: CYSTOSCOPY WITH RIGHT URETERAL STENT PLACEMENT;  Surgeon: Elisabeth Valli BIRCH, MD;  Location: [facility];  Service: Urology;  Laterality: Right;   CYSTOSCOPY WITH RETROGRADE PYELOGRAM, URETEROSCOPY AND STENT PLACEMENT Right 07/20/2019   Procedure: CYSTOSCOPY WITH RETROGRADE PYELOGRAM, URETEROSCOPY AND STENT PLACEMENT;  Surgeon: Elisabeth Valli BIRCH, MD;   Location: [facility];  Service: Urology;  Laterality: Right;  90 MINS   HOLMIUM LASER APPLICATION Right 07/20/2019   Procedure: HOLMIUM LASER APPLICATION;  Surgeon: Elisabeth Valli BIRCH, MD;  Location: [facility];  Service: Urology;  Laterality: Right;   HOLMIUM LASER APPLICATION Right 10/26/2019   Procedure: HOLMIUM LASER APPLICATION;  Surgeon: Elisabeth Valli BIRCH, MD;  Location: [facility];  Service: Urology;  Laterality: Right;   IR URETERAL STENT RIGHT NEW ACCESS W/O SEP NEPHROSTOMY CATH  10/26/2019   LITHOTRIPSY     NEPHROLITHOTOMY Right 10/26/2019   Procedure: NEPHROLITHOTOMY PERCUTANEOUS;  Surgeon: Elisabeth Valli BIRCH, MD;  Location: [facility];  Service: Urology;  Laterality: Right;  3 HRS   PINS IN RIGHT ARM     LATER REMOVED   Family History  Problem Relation Age of Onset   Diabetes Mother    Heart disease Mother    Hypertension Mother    COPD Father    Arthritis Maternal Grandfather    Arthritis Maternal Grandmother    Cancer Maternal Aunt    Kidney disease Maternal Aunt    Social History   Socioeconomic History   Marital status: Significant Other    Spouse name: Not on file   Number of children: Not on file   Years of education: Not on file   Highest education level: 12th grade  Occupational History   Not on file  Tobacco Use   Smoking status: Every Day    Current packs/day: 1.00    Average packs/day: 1 pack/day for 3.0 years (3.0 ttl pk-yrs)    Types: Cigarettes, E-cigarettes    Passive exposure: Never   Smokeless tobacco: Never  Vaping Use   Vaping status: Every Day  Substance and Sexual Activity   Alcohol use: Not Currently    Alcohol/week: 1.0 standard drink of alcohol    Types: 1 Cans of beer per week    Comment: RARE   Drug use: Yes    Types: Oxycodone , Hydrocodone    Sexual activity: Yes    Birth control/protection: None  Other Topics Concern   Not on file  Social History Narrative   Not on file   Social Drivers of Health    Financial Resource Strain: Low Risk  (01/16/2024)   Overall Financial Resource Strain (CARDIA)    Difficulty of Paying Living Expenses: Not hard at all  Food Insecurity: No Food Insecurity (01/16/2024)   Hunger Vital Sign    Worried About Running Out of Food in the Last Year: Never true    Ran Out of Food in the Last Year: Never true  Transportation Needs: No Transportation Needs (01/16/2024)   PRAPARE - Administrator, Civil Service (Medical): No    Lack of Transportation (Non-Medical): No  Physical Activity: Inactive (01/16/2024)   Exercise Vital Sign    Days of Exercise per Week: 0 days    Minutes of Exercise per Session: 0 min  Stress: Stress Concern Present (01/16/2024)   [employer] of Occupational Health - Occupational Stress Questionnaire    Feeling of Stress: To some extent  Social Connections: Moderately Isolated (01/16/2024)   Social Connection and Isolation Panel    Frequency of Communication with Friends and Family: Never    Frequency of Social Gatherings with Friends and Family: More than three times a week    Attends Religious Services: Never    Database administrator or Organizations: No    Attends Engineer, structural: Never    Marital Status: Married    Tobacco Counseling Ready to quit: Not Answered Counseling given: Not Answered    Clinical Intake:  Pre-visit preparation completed: Yes  Pain : No/denies pain     Nutritional Risks: None Diabetes: No  Lab Results  Component Value Date   HGBA1C 4.7 (L) 06/23/2019     How often do you need to have someone help you when you read instructions, pamphlets, or other written materials from your doctor or pharmacy?: 1 - Never  Interpreter Needed?: No  Information entered by :: NAllen LPN   Activities of Daily Living     01/16/2024   11:21 AM  In your present state of health, do you have any difficulty performing the following activities:  Hearing? 0  Vision? 0  Difficulty  concentrating or making decisions? 0  Walking or climbing stairs? 0  Dressing or bathing? 0  Doing errands, shopping? 0  Preparing Food and eating ? N  Using the Toilet? N  In the past six months, have you accidently leaked urine? N  Do you have problems with loss of bowel control? N  Managing your Medications? N  Managing your Finances? N  Housekeeping or managing your Housekeeping? N    Patient Care Team: Sebastian Beverley NOVAK, MD as PCP - General (Family Medicine)  I have updated your Care Teams any recent Medical Services you may have received from other providers in the past year.     Assessment:   This is a routine wellness examination for Phillippe.  Hearing/Vision screen Hearing Screening - Comments:: Denies hearing issues Vision Screening - Comments:: Regular eye exams,    Goals Addressed             This Visit's Progress    Patient Stated       01/16/2024, be healthier       Depression Screen     01/16/2024   11:29 AM 04/08/2023    4:30 PM 07/10/2022    2:16 PM 06/05/2022    2:16 PM 04/23/2022    9:23 AM  PHQ 2/9 Scores  PHQ - 2 Score 3 2   5   PHQ- 9 Score 9 11   23      Information is confidential and restricted. Go to Review Flowsheets to unlock data.    Fall Risk     01/16/2024   11:29 AM 04/08/2023    4:30 PM  Fall Risk   Falls in the past year? 0 0  Number falls in past yr: 0 0  Injury with Fall? 0 0  Risk for fall due to : Medication side effect No Fall Risks  Follow up Falls evaluation completed;Falls prevention discussed Falls evaluation completed    MEDICARE RISK AT HOME:  Medicare Risk at Home Any stairs in or around the home?: No If so, are there any without handrails?: No Home free of loose throw rugs in walkways, pet beds, electrical cords, etc?: Yes Adequate lighting in your home to reduce risk of falls?: Yes Life alert?: No Use of a cane, walker or w/c?: No Grab bars in the  bathroom?: No Shower chair or bench in shower?:  No Elevated toilet seat or a handicapped toilet?: No  TIMED UP AND GO:  Was the test performed?  No  Cognitive Function: 6CIT completed        01/16/2024   11:31 AM  6CIT Screen  What Year? 0 points  What month? 0 points  What time? 0 points  Count back from 20 0 points  Months in reverse 0 points  Repeat phrase 2 points  Total Score 2 points    Immunizations Immunization History  Administered Date(s) Administered   Tdap 04/23/2022    Screening Tests Health Maintenance  Topic Date Due   Hepatitis C Screening  Never done   Pneumococcal Vaccine 25-21 Years old (1 of 2 - PCV) Never done   Hepatitis B Vaccines (1 of 3 - 19+ 3-dose series) Never done   HPV VACCINES (1 - 3-dose SCDM series) Never done   COVID-19 Vaccine (1 - 2024-25 season) Never done   INFLUENZA VACCINE  01/30/2024   Medicare Annual Wellness (AWV)  01/15/2025   DTaP/Tdap/Td (2 - Td or Tdap) 04/23/2032   HIV Screening  Completed   Meningococcal B Vaccine  Aged Out    Health Maintenance  Health Maintenance Due  Topic Date Due   Hepatitis C Screening  Never done   Pneumococcal Vaccine 77-48 Years old (1 of 2 - PCV) Never done   Hepatitis B Vaccines (1 of 3 - 19+ 3-dose series) Never done   HPV VACCINES (1 - 3-dose SCDM series) Never done   COVID-19 Vaccine (1 - 2024-25 season) Never done   Health Maintenance Items Addressed: Due for pneumonia and hep b vaccine.  Additional Screening:  Vision Screening: Recommended annual ophthalmology exams for early detection of glaucoma and other disorders of the eye. Would you like a referral to an eye doctor? No    Dental Screening: Recommended annual dental exams for proper oral hygiene  Community Resource Referral / Chronic Care Management: CRR required this visit?  No   CCM required this visit?  No   Plan:    I have personally reviewed and noted the following in the patient's chart:   Medical and social history Use of alcohol, tobacco or  illicit drugs  Current medications and supplements including opioid prescriptions. Patient is currently taking opioid prescriptions. Information provided to patient regarding non-opioid alternatives. Patient advised to discuss non-opioid treatment plan with their provider. Functional ability and status Nutritional status Physical activity Advanced directives List of other physicians Hospitalizations, surgeries, and ER visits in previous 12 months Vitals Screenings to include cognitive, depression, and falls Referrals and appointments  In addition, I have reviewed and discussed with patient certain preventive protocols, quality metrics, and best practice recommendations. A written personalized care plan for preventive services as well as general preventive health recommendations were provided to patient.   Ardella FORBES Dawn, LPN   [id number]   After Visit Summary: (MyChart) Due to this being a telephonic visit, the after visit summary with patients personalized plan was offered to patient via MyChart   Notes: Nothing significant to report at this time.

## 2024-01-16 NOTE — Addendum Note (Signed)
 Addended by: Kendarrius Tanzi E on: 01/16/2024 04:26 PM   Modules accepted: Level of Service

## 2024-01-19 NOTE — Telephone Encounter (Signed)
 Last Ov 04/08/23 Filled 04/08/23

## 2024-05-03 ENCOUNTER — Encounter: Payer: Self-pay | Admitting: Radiology

## 2024-08-06 NOTE — Progress Notes (Incomplete)
{  ELABTTemplates:[id number]}

## 2024-08-09 ENCOUNTER — Ambulatory Visit: Admitting: Family Medicine
